# Patient Record
Sex: Female | Born: 1975
Health system: Southern US, Community
[De-identification: ages and names within clinical notes are randomized; demographics above are authoritative.]

## PROBLEM LIST (undated history)

## (undated) DIAGNOSIS — G4733 Obstructive sleep apnea (adult) (pediatric): Secondary | ICD-10-CM

## (undated) DIAGNOSIS — I471 Supraventricular tachycardia: Secondary | ICD-10-CM

## (undated) DIAGNOSIS — F32A Depression, unspecified: Secondary | ICD-10-CM

## (undated) DIAGNOSIS — D649 Anemia, unspecified: Secondary | ICD-10-CM

## (undated) DIAGNOSIS — D259 Leiomyoma of uterus, unspecified: Secondary | ICD-10-CM

## (undated) DIAGNOSIS — R002 Palpitations: Secondary | ICD-10-CM

## (undated) DIAGNOSIS — Z9989 Dependence on other enabling machines and devices: Secondary | ICD-10-CM

## (undated) DIAGNOSIS — K760 Fatty (change of) liver, not elsewhere classified: Secondary | ICD-10-CM

## (undated) DIAGNOSIS — Z91018 Allergy to other foods: Secondary | ICD-10-CM

## (undated) DIAGNOSIS — E041 Nontoxic single thyroid nodule: Secondary | ICD-10-CM

## (undated) DIAGNOSIS — F419 Anxiety disorder, unspecified: Secondary | ICD-10-CM

## (undated) DIAGNOSIS — K7581 Nonalcoholic steatohepatitis (NASH): Secondary | ICD-10-CM

## (undated) DIAGNOSIS — K219 Gastro-esophageal reflux disease without esophagitis: Secondary | ICD-10-CM

## (undated) DIAGNOSIS — I493 Ventricular premature depolarization: Secondary | ICD-10-CM

## (undated) DIAGNOSIS — M2550 Pain in unspecified joint: Secondary | ICD-10-CM

## (undated) DIAGNOSIS — I10 Essential (primary) hypertension: Secondary | ICD-10-CM

## (undated) DIAGNOSIS — E611 Iron deficiency: Secondary | ICD-10-CM

## (undated) HISTORY — DX: Obstructive sleep apnea (adult) (pediatric): G47.33

## (undated) HISTORY — DX: Ventricular premature depolarization: I49.3

## (undated) HISTORY — DX: Pain in unspecified joint: M25.50

## (undated) HISTORY — DX: Palpitations: R00.2

## (undated) HISTORY — DX: Anemia, unspecified: D64.9

## (undated) HISTORY — PX: HIP SURGERY: SHX245

## (undated) HISTORY — DX: Essential (primary) hypertension: I10

## (undated) HISTORY — DX: Nontoxic single thyroid nodule: E04.1

## (undated) HISTORY — DX: Anxiety disorder, unspecified: F41.9

## (undated) HISTORY — DX: Nonalcoholic steatohepatitis (NASH): K75.81

## (undated) HISTORY — DX: Depression, unspecified: F32.A

## (undated) HISTORY — DX: Morbid (severe) obesity due to excess calories: E66.01

## (undated) HISTORY — DX: Supraventricular tachycardia: I47.1

## (undated) HISTORY — DX: Iron deficiency: E61.1

## (undated) HISTORY — DX: Dependence on other enabling machines and devices: Z99.89

## (undated) HISTORY — DX: Allergy to other foods: Z91.018

## (undated) HISTORY — DX: Fatty (change of) liver, not elsewhere classified: K76.0

## (undated) HISTORY — DX: Gastro-esophageal reflux disease without esophagitis: K21.9

## (undated) HISTORY — DX: Leiomyoma of uterus, unspecified: D25.9

## (undated) HISTORY — PX: MANDIBLE RECONSTRUCTION: SHX431

---
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
## 1999-10-23 ENCOUNTER — Emergency Department (HOSPITAL_COMMUNITY): Admission: EM | Admit: 1999-10-23 | Discharge: 1999-10-23 | Payer: Self-pay | Admitting: Emergency Medicine

## 2000-06-24 ENCOUNTER — Emergency Department (HOSPITAL_COMMUNITY): Admission: EM | Admit: 2000-06-24 | Discharge: 2000-06-24 | Payer: Self-pay | Admitting: Emergency Medicine

## 2008-03-11 ENCOUNTER — Emergency Department (HOSPITAL_COMMUNITY): Admission: EM | Admit: 2008-03-11 | Discharge: 2008-03-11 | Payer: Self-pay | Admitting: Emergency Medicine

## 2008-03-18 ENCOUNTER — Emergency Department (HOSPITAL_COMMUNITY): Admission: EM | Admit: 2008-03-18 | Discharge: 2008-03-18 | Payer: Self-pay | Admitting: Family Medicine

## 2008-08-26 ENCOUNTER — Inpatient Hospital Stay (HOSPITAL_COMMUNITY): Admission: AD | Admit: 2008-08-26 | Discharge: 2008-08-26 | Payer: Self-pay | Admitting: Obstetrics & Gynecology

## 2009-01-02 ENCOUNTER — Encounter: Payer: Self-pay | Admitting: Advanced Practice Midwife

## 2009-01-02 ENCOUNTER — Ambulatory Visit: Payer: Self-pay | Admitting: Obstetrics & Gynecology

## 2009-04-01 DIAGNOSIS — I471 Supraventricular tachycardia, unspecified: Secondary | ICD-10-CM

## 2009-04-01 HISTORY — DX: Supraventricular tachycardia, unspecified: I47.10

## 2009-04-01 HISTORY — DX: Supraventricular tachycardia: I47.1

## 2009-07-30 ENCOUNTER — Emergency Department (HOSPITAL_BASED_OUTPATIENT_CLINIC_OR_DEPARTMENT_OTHER): Admission: EM | Admit: 2009-07-30 | Discharge: 2009-07-30 | Payer: Self-pay | Admitting: Emergency Medicine

## 2009-08-10 ENCOUNTER — Encounter: Payer: Self-pay | Admitting: Internal Medicine

## 2009-08-13 ENCOUNTER — Ambulatory Visit: Payer: Self-pay | Admitting: Obstetrics & Gynecology

## 2009-08-14 ENCOUNTER — Encounter: Payer: Self-pay | Admitting: Obstetrics and Gynecology

## 2009-08-14 LAB — CONVERTED CEMR LAB
Chlamydia, DNA Probe: NEGATIVE
Hepatitis B Surface Ag: NEGATIVE

## 2010-04-27 NOTE — Letter (Signed)
Summary: EGD ORDER  EGD ORDER   Imported By: Sofie Rower 08/10/2009 15:39:45  _____________________________________________________________________  External Attachment:    Type:   Image     Comment:   External Document

## 2010-05-14 ENCOUNTER — Other Ambulatory Visit: Payer: Self-pay | Admitting: Obstetrics and Gynecology

## 2010-05-14 ENCOUNTER — Inpatient Hospital Stay (HOSPITAL_COMMUNITY)
Admission: AD | Admit: 2010-05-14 | Discharge: 2010-05-14 | Disposition: A | Discharge status: Home / Self Care | Payer: Medicaid Other | Source: Ambulatory Visit | Attending: Obstetrics and Gynecology | Admitting: Obstetrics and Gynecology

## 2010-05-14 ENCOUNTER — Inpatient Hospital Stay (HOSPITAL_COMMUNITY): Payer: Medicaid Other

## 2010-05-14 DIAGNOSIS — O039 Complete or unspecified spontaneous abortion without complication: Secondary | ICD-10-CM | POA: Insufficient documentation

## 2010-05-14 LAB — CBC
HCT: 30 % — ABNORMAL LOW (ref 36.0–46.0)
MCHC: 30.7 g/dL (ref 30.0–36.0)
MCV: 64.5 fL — ABNORMAL LOW (ref 78.0–100.0)
Platelets: 385 10*3/uL (ref 150–400)
RDW: 19.3 % — ABNORMAL HIGH (ref 11.5–15.5)
WBC: 13.5 10*3/uL — ABNORMAL HIGH (ref 4.0–10.5)

## 2010-05-18 ENCOUNTER — Inpatient Hospital Stay (HOSPITAL_COMMUNITY)
Admission: AD | Admit: 2010-05-18 | Discharge: 2010-05-18 | Disposition: A | Discharge status: Home / Self Care | Payer: Medicaid Other | Source: Ambulatory Visit | Attending: Obstetrics & Gynecology | Admitting: Obstetrics & Gynecology

## 2010-05-18 DIAGNOSIS — R109 Unspecified abdominal pain: Secondary | ICD-10-CM | POA: Insufficient documentation

## 2010-05-18 LAB — CBC
HCT: 30.4 % — ABNORMAL LOW (ref 36.0–46.0)
MCH: 19.9 pg — ABNORMAL LOW (ref 26.0–34.0)
MCHC: 30.3 g/dL (ref 30.0–36.0)
MCV: 65.7 fL — ABNORMAL LOW (ref 78.0–100.0)
Platelets: 407 10*3/uL — ABNORMAL HIGH (ref 150–400)
RDW: 19.7 % — ABNORMAL HIGH (ref 11.5–15.5)
WBC: 12.7 10*3/uL — ABNORMAL HIGH (ref 4.0–10.5)

## 2010-06-04 ENCOUNTER — Encounter (INDEPENDENT_AMBULATORY_CARE_PROVIDER_SITE_OTHER): Payer: Medicaid Other | Admitting: Family Medicine

## 2010-06-04 DIAGNOSIS — R1084 Generalized abdominal pain: Secondary | ICD-10-CM

## 2010-06-04 DIAGNOSIS — O039 Complete or unspecified spontaneous abortion without complication: Secondary | ICD-10-CM

## 2010-06-15 LAB — CBC
HCT: 33.5 % — ABNORMAL LOW (ref 36.0–46.0)
MCHC: 32.5 g/dL (ref 30.0–36.0)
MCV: 69.3 fL — ABNORMAL LOW (ref 78.0–100.0)
Platelets: 413 10*3/uL — ABNORMAL HIGH (ref 150–400)
WBC: 11.6 10*3/uL — ABNORMAL HIGH (ref 4.0–10.5)

## 2010-06-15 LAB — DIFFERENTIAL
Basophils Absolute: 0 10*3/uL (ref 0.0–0.1)
Eosinophils Relative: 2 % (ref 0–5)
Lymphocytes Relative: 16 % (ref 12–46)
Lymphs Abs: 1.9 10*3/uL (ref 0.7–4.0)
Monocytes Relative: 4 % (ref 3–12)
Neutro Abs: 9 10*3/uL — ABNORMAL HIGH (ref 1.7–7.7)

## 2010-06-15 LAB — COMPREHENSIVE METABOLIC PANEL
AST: 14 U/L (ref 0–37)
Albumin: 3.9 g/dL (ref 3.5–5.2)
BUN: 12 mg/dL (ref 6–23)
Calcium: 8.9 mg/dL (ref 8.4–10.5)
Chloride: 106 mEq/L (ref 96–112)
Creatinine, Ser: 0.6 mg/dL (ref 0.4–1.2)
GFR calc Af Amer: >60 mL/min (ref >60)
GFR calc non Af Amer: >60 mL/min (ref >60)
Total Bilirubin: 0.6 mg/dL (ref 0.3–1.2)

## 2010-06-15 LAB — WET PREP, GENITAL: Trich, Wet Prep: NONE SEEN

## 2010-06-15 LAB — URINALYSIS, ROUTINE W REFLEX MICROSCOPIC
Bilirubin Urine: NEGATIVE
Glucose, UA: NEGATIVE mg/dL
Hgb urine dipstick: NEGATIVE
Ketones, ur: NEGATIVE mg/dL
Protein, ur: NEGATIVE mg/dL
Urobilinogen, UA: 0.2 mg/dL (ref 0.0–1.0)

## 2010-06-18 NOTE — Progress Notes (Signed)
Jacqueline Griffin, Jacqueline Griffin            ACCOUNT NO.:  [id number]  MEDICAL RECORD NO.:  [id number]           PATIENT TYPE:  A  LOCATION:  [facility]                   FACILITY:  [facility]  PHYSICIAN:  Darron Doom, MD        DATE OF BIRTH:  06-21-1975  DATE OF SERVICE:  06/04/2010                                 CLINIC NOTE  CHIEF COMPLAINT:  Followup miscarriage.  HISTORY OF PRESENT ILLNESS:  The patient is a 35 year old gravida 2, para 0-0-2-0, who came in to the MAU for an SAB on May 14, 2010, where she miscarried in the MAU.  Since that time, her bleeding has become much less.  She is interested in achieving pregnancy again.  She has a long history of menorrhagia with a known fibroid uterus and possible adenomyosis.  She is uninterested in any kind of hormonal manipulation at this point or definitive therapy.  We had a lengthy discussion going over adenomyosis, endometriosis, and fibroid uterus and what that entails as well.  Additionally today, the patient is complaining right upper quadrant pain that seems to come after certain meals.  This is under her right breast, radiates through to her back, and associated with nausea.  The patient is worried she has gallbladder disease.  This had been going on prior to her miscarriage.  On exam today, her vitals are as noted in the chart.  She is well developed, well nourished in no acute distress.  Abdomen is soft, nontender, and nondistended.  IMPRESSION: 1. Status post spontaneous abortion completed, discussed reasons for     first trimester miscarriage, waiting 2-3 months before trying to     attempt pregnancy again. 2. Adenomyosis and fibroid uterus.  Lengthy discussion was had with     the patient explaining this. 3. Right upper quadrant pain of unclear etiology.  PLAN: 1. PCP referral, workup of right upper quadrant pain. 2. May start to try to achieve pregnancy in the next 2-3 months.           ______________________________ Darron Doom, MD    TP/MEDQ  D:  06/04/2010  T:  06/05/2010  Job:  [id number]

## 2010-07-05 LAB — CBC
HCT: 35.5 % — ABNORMAL LOW (ref 36.0–46.0)
MCV: 73 fL — ABNORMAL LOW (ref 78.0–100.0)
Platelets: 372 10*3/uL (ref 150–400)
RDW: 18 % — ABNORMAL HIGH (ref 11.5–15.5)

## 2010-08-25 ENCOUNTER — Inpatient Hospital Stay (HOSPITAL_COMMUNITY)
Admission: AD | Admit: 2010-08-25 | Discharge: 2010-08-25 | Disposition: A | Discharge status: Home / Self Care | Payer: Self-pay | Source: Ambulatory Visit | Attending: Obstetrics & Gynecology | Admitting: Obstetrics & Gynecology

## 2010-08-25 DIAGNOSIS — N938 Other specified abnormal uterine and vaginal bleeding: Secondary | ICD-10-CM

## 2010-08-25 DIAGNOSIS — N949 Unspecified condition associated with female genital organs and menstrual cycle: Secondary | ICD-10-CM

## 2010-08-25 LAB — CBC
HCT: 33.9 % — ABNORMAL LOW (ref 36.0–46.0)
Hemoglobin: 10.6 g/dL — ABNORMAL LOW (ref 12.0–15.0)
MCH: 21.6 pg — ABNORMAL LOW (ref 26.0–34.0)
MCHC: 31.3 g/dL (ref 30.0–36.0)
MCV: 69 fL — ABNORMAL LOW (ref 78.0–100.0)

## 2010-08-25 LAB — WET PREP, GENITAL: Trich, Wet Prep: NONE SEEN

## 2010-08-25 LAB — URINALYSIS, ROUTINE W REFLEX MICROSCOPIC
Bilirubin Urine: NEGATIVE
Ketones, ur: NEGATIVE mg/dL
Leukocytes, UA: NEGATIVE
Nitrite: NEGATIVE
Protein, ur: NEGATIVE mg/dL
Urobilinogen, UA: 0.2 mg/dL (ref 0.0–1.0)

## 2010-08-25 LAB — URINE MICROSCOPIC-ADD ON

## 2010-08-25 LAB — POCT PREGNANCY, URINE: Preg Test, Ur: NEGATIVE

## 2010-08-26 LAB — GC/CHLAMYDIA PROBE AMP, GENITAL
Chlamydia, DNA Probe: NEGATIVE
GC Probe Amp, Genital: NEGATIVE

## 2010-08-31 ENCOUNTER — Other Ambulatory Visit (HOSPITAL_COMMUNITY): Payer: Self-pay

## 2010-08-31 ENCOUNTER — Ambulatory Visit (HOSPITAL_COMMUNITY)
Admission: RE | Admit: 2010-08-31 | Discharge: 2010-08-31 | Disposition: A | Discharge status: Home / Self Care | Payer: Self-pay | Source: Ambulatory Visit | Attending: Obstetrics & Gynecology | Admitting: Obstetrics & Gynecology

## 2010-08-31 ENCOUNTER — Ambulatory Visit (HOSPITAL_COMMUNITY): Payer: Self-pay

## 2010-08-31 DIAGNOSIS — N949 Unspecified condition associated with female genital organs and menstrual cycle: Secondary | ICD-10-CM | POA: Insufficient documentation

## 2010-08-31 DIAGNOSIS — D259 Leiomyoma of uterus, unspecified: Secondary | ICD-10-CM | POA: Insufficient documentation

## 2010-08-31 DIAGNOSIS — N938 Other specified abnormal uterine and vaginal bleeding: Secondary | ICD-10-CM | POA: Insufficient documentation

## 2010-09-25 DIAGNOSIS — N920 Excessive and frequent menstruation with regular cycle: Secondary | ICD-10-CM | POA: Insufficient documentation

## 2010-09-25 DIAGNOSIS — O039 Complete or unspecified spontaneous abortion without complication: Secondary | ICD-10-CM | POA: Insufficient documentation

## 2010-10-11 ENCOUNTER — Other Ambulatory Visit: Payer: Self-pay | Admitting: Obstetrics & Gynecology

## 2010-11-17 ENCOUNTER — Ambulatory Visit (INDEPENDENT_AMBULATORY_CARE_PROVIDER_SITE_OTHER): Payer: Self-pay | Admitting: Obstetrics and Gynecology

## 2010-11-17 ENCOUNTER — Other Ambulatory Visit (HOSPITAL_COMMUNITY)
Admission: RE | Admit: 2010-11-17 | Discharge: 2010-11-17 | Disposition: A | Discharge status: Home / Self Care | Payer: Self-pay | Source: Ambulatory Visit | Attending: Obstetrics and Gynecology | Admitting: Obstetrics and Gynecology

## 2010-11-17 ENCOUNTER — Encounter: Payer: Self-pay | Admitting: Obstetrics and Gynecology

## 2010-11-17 VITALS — BP 140/81 | HR 83 | Temp 97.2°F | Ht 67.0 in | Wt 264.9 lb

## 2010-11-17 DIAGNOSIS — Z01419 Encounter for gynecological examination (general) (routine) without abnormal findings: Secondary | ICD-10-CM

## 2010-11-17 DIAGNOSIS — N920 Excessive and frequent menstruation with regular cycle: Secondary | ICD-10-CM

## 2010-11-17 DIAGNOSIS — N938 Other specified abnormal uterine and vaginal bleeding: Secondary | ICD-10-CM | POA: Insufficient documentation

## 2010-11-17 DIAGNOSIS — N949 Unspecified condition associated with female genital organs and menstrual cycle: Secondary | ICD-10-CM | POA: Insufficient documentation

## 2010-11-17 DIAGNOSIS — N921 Excessive and frequent menstruation with irregular cycle: Secondary | ICD-10-CM

## 2010-11-17 NOTE — Progress Notes (Signed)
  Subjective:    Patient ID: Jacqueline Griffin, female    DOB: 1975-05-16, 35 y.o.   MRN: [id number]  HPI 35 yo G2P0020 presenting today for annual exam and MAU follow-up. Patient was seen in May for an episode of metrorrhagia for which she was treated with megace. Patient has a know history of fibroid uterus and most recent ultrasound obtained in June demonstrated normal adnexa and stable fibroid uterus with 2 myometrial fibroids measuring 3 cm and 1.7 cm. Patient reports that she had no period in June s/p Megace; had a normal period in July and August. Patient reports however that her periods seem different, in the sense that the blood is much darker and associated with clots on occasions. Patient is not actively trying to conceive at this time and is open to the use of birth control if necessay  PMHx: SVT  PSHx: Jaw surgery  PObHx: Sab x2  PGynHx: Denies cyst, or abnormal pap smear. + fibroid uterus  Social Hx: Denies drinking, smoking or the use of illicit drugs  Family hx: DM, HTN, leukemia  Review of Systems  All other systems reviewed and are negative.      Objective:   Physical Exam GENERAL: Well-developed, well-nourished female in no acute distress.  HEENT: Normocephalic, atraumatic. Sclerae anicteric.  NECK: Supple. Normal thyroid.  LUNGS: Clear to auscultation bilaterally.  HEART: Regular rate and rhythm. BREASTS: Symmetric in size. No palpable masses or lymphadenopathy, skin changes, or nipple drainage. ABDOMEN: Soft, nontender, nondistended. No organomegaly. obese PELVIC: Normal external female genitalia. Vagina is pink and rugated.  Normal discharge. Normal appearing cervix. Uterus is normal in size.  No adnexal mass or tenderness. EXTREMITIES: No cyanosis, clubbing, or edema, 2+ distal pulses.      Assessment & Plan:  35 yo G2P0020 here for annual exam - pap smear collected - ENDOMETRIAL BIOPSY     The indications for endometrial biopsy were reviewed.   Risks  of the biopsy including cramping, bleeding, infection, uterine perforation, inadequate specimen and need for additional procedures  were discussed. The patient states she understands and agrees to undergo procedure today. Consent was signed. Time out was performed. Urine HCG was negative. A sterile speculum was placed in the patient's vagina and the cervix was prepped with Betadine. A single-toothed tenaculum was placed on the anterior lip of the cervix to stabilize it. The uterine cavity was sounded to a depth of 8 cm using the uterine sound. The 3 mm pipelle was introduced into the endometrial cavity without difficulty, 2 passes were made.  A  moderate amount of tissue was  sent to pathology. The instruments were removed from the patient's vagina. Minimal bleeding from the cervix was noted. The patient tolerated the procedure well.  Routine post-procedure instructions were given to the patient.  - Patient will follow up in two weeks to review the results and for further management.  - Patient is undecided on birth control method for now; she is hesitating between [facility] and IUD. Patient will hopefully decide at her next visit.

## 2010-12-15 ENCOUNTER — Ambulatory Visit (INDEPENDENT_AMBULATORY_CARE_PROVIDER_SITE_OTHER): Payer: Self-pay | Admitting: Physician Assistant

## 2010-12-15 DIAGNOSIS — Z7189 Other specified counseling: Secondary | ICD-10-CM

## 2010-12-15 DIAGNOSIS — Z712 Person consulting for explanation of examination or test findings: Secondary | ICD-10-CM

## 2010-12-15 DIAGNOSIS — N920 Excessive and frequent menstruation with regular cycle: Secondary | ICD-10-CM

## 2010-12-15 MED ORDER — NORGESTIMATE-ETH ESTRADIOL 0.25-35 MG-MCG PO TABS: 1.0000 | ORAL_TABLET | Freq: Every day | ORAL | Status: DC

## 2010-12-15 NOTE — Patient Instructions (Addendum)
It was great to see you today! I am giving you a prescription for birth control pills to try to regulate your cycles. Come back to see [place] for your pap smear is 1 year or sooner if you have problems.

## 2010-12-15 NOTE — Progress Notes (Signed)
Subjective: Pt presents today for followup and reviewing results.  Has not been having any problems since her procedure.  Reviewed normal results with pt.  Also discussed OCPs for regulating of her cycles (both pain/amount of bleeding and regularity).  Pt is interested in this at this time.  She has still had somewhat irregular periods for about the last 6 months.  Objective:  Filed Vitals:   12/15/10 1559  BP: 134/82  Pulse:   Temp:    Gen: NAD, obese CV: RRR Resp: CTABL Ext: No edema  Assessment/Plan: Decided to start OCPs today.  Answered all patient questions on starting them, possible side effect, and risk/benefits.  Greater than 80% of 15 minute visit spent counciling on this issue.  Return for pap smear in 1 year.

## 2012-04-01 HISTORY — DX: Morbid (severe) obesity due to excess calories: E66.01

## 2012-11-28 DIAGNOSIS — K7581 Nonalcoholic steatohepatitis (NASH): Secondary | ICD-10-CM

## 2012-11-28 HISTORY — DX: Nonalcoholic steatohepatitis (NASH): K75.81

## 2014-01-27 ENCOUNTER — Encounter: Payer: Self-pay | Admitting: Obstetrics and Gynecology

## 2014-11-07 DIAGNOSIS — E041 Nontoxic single thyroid nodule: Secondary | ICD-10-CM | POA: Insufficient documentation

## 2014-11-07 HISTORY — DX: Nontoxic single thyroid nodule: E04.1

## 2015-08-14 ENCOUNTER — Emergency Department (HOSPITAL_BASED_OUTPATIENT_CLINIC_OR_DEPARTMENT_OTHER)
Admission: EM | Admit: 2015-08-14 | Discharge: 2015-08-14 | Disposition: A | Discharge status: Home / Self Care | Payer: Managed Care, Other (non HMO) | Attending: Emergency Medicine | Admitting: Emergency Medicine

## 2015-08-14 ENCOUNTER — Emergency Department (HOSPITAL_BASED_OUTPATIENT_CLINIC_OR_DEPARTMENT_OTHER): Payer: Managed Care, Other (non HMO)

## 2015-08-14 ENCOUNTER — Encounter (HOSPITAL_BASED_OUTPATIENT_CLINIC_OR_DEPARTMENT_OTHER): Payer: Self-pay | Admitting: *Deleted

## 2015-08-14 DIAGNOSIS — Z79899 Other long term (current) drug therapy: Secondary | ICD-10-CM | POA: Diagnosis not present

## 2015-08-14 DIAGNOSIS — R1012 Left upper quadrant pain: Secondary | ICD-10-CM | POA: Insufficient documentation

## 2015-08-14 DIAGNOSIS — M545 Low back pain, unspecified: Secondary | ICD-10-CM

## 2015-08-14 LAB — CBC WITH DIFFERENTIAL/PLATELET
Basophils Absolute: 0 10*3/uL (ref 0.0–0.1)
Basophils Relative: 0 %
Eosinophils Absolute: 0.2 10*3/uL (ref 0.0–0.7)
Eosinophils Relative: 1 %
HCT: 36.2 % (ref 36.0–46.0)
Hemoglobin: 11.8 g/dL — ABNORMAL LOW (ref 12.0–15.0)
Lymphocytes Relative: 20 %
Lymphs Abs: 2.4 10*3/uL (ref 0.7–4.0)
MCH: 23.6 pg — ABNORMAL LOW (ref 26.0–34.0)
MCHC: 32.6 g/dL (ref 30.0–36.0)
MCV: 72.4 fL — ABNORMAL LOW (ref 78.0–100.0)
Monocytes Absolute: 0.8 10*3/uL (ref 0.1–1.0)
Monocytes Relative: 7 %
Neutro Abs: 8.5 10*3/uL — ABNORMAL HIGH (ref 1.7–7.7)
Neutrophils Relative %: 72 %
Platelets: 373 10*3/uL (ref 150–400)
RBC: 5 MIL/uL (ref 3.87–5.11)
RDW: 16.9 % — ABNORMAL HIGH (ref 11.5–15.5)
WBC: 11.9 10*3/uL — ABNORMAL HIGH (ref 4.0–10.5)

## 2015-08-14 LAB — URINE MICROSCOPIC-ADD ON

## 2015-08-14 LAB — COMPREHENSIVE METABOLIC PANEL
ALT: 10 U/L — ABNORMAL LOW (ref 14–54)
AST: 15 U/L (ref 15–41)
Albumin: 3.7 g/dL (ref 3.5–5.0)
Alkaline Phosphatase: 70 U/L (ref 38–126)
Anion gap: 7 (ref 5–15)
BUN: 11 mg/dL (ref 6–20)
CO2: 23 mmol/L (ref 22–32)
Calcium: 8.8 mg/dL — ABNORMAL LOW (ref 8.9–10.3)
Chloride: 106 mmol/L (ref 101–111)
Creatinine, Ser: 0.62 mg/dL (ref 0.44–1.00)
GFR calc Af Amer: >60 mL/min (ref >60)
GFR calc non Af Amer: >60 mL/min (ref >60)
Glucose, Bld: 96 mg/dL (ref 65–99)
Potassium: 3.4 mmol/L — ABNORMAL LOW (ref 3.5–5.1)
Sodium: 136 mmol/L (ref 135–145)
Total Bilirubin: 0.6 mg/dL (ref 0.3–1.2)
Total Protein: 8 g/dL (ref 6.5–8.1)

## 2015-08-14 LAB — URINALYSIS, ROUTINE W REFLEX MICROSCOPIC
BILIRUBIN URINE: NEGATIVE
Glucose, UA: NEGATIVE mg/dL
KETONES UR: NEGATIVE mg/dL
NITRITE: NEGATIVE
Protein, ur: NEGATIVE mg/dL
SPECIFIC GRAVITY, URINE: 1.022 (ref 1.005–1.030)
pH: 6 (ref 5.0–8.0)

## 2015-08-14 LAB — LIPASE, BLOOD: LIPASE: 20 U/L (ref 11–51)

## 2015-08-14 LAB — PREGNANCY, URINE: PREG TEST UR: NEGATIVE

## 2015-08-14 MED ORDER — ONDANSETRON HCL 4 MG/2ML IJ SOLN: 4.0000 mg | Freq: Once | INTRAMUSCULAR | Status: DC

## 2015-08-14 MED ORDER — CYCLOBENZAPRINE HCL 10 MG PO TABS: 10.0000 mg | ORAL_TABLET | Freq: Two times a day (BID) | ORAL | Status: DC | PRN

## 2015-08-14 MED ORDER — NAPROXEN 500 MG PO TABS: 500.0000 mg | ORAL_TABLET | Freq: Two times a day (BID) | ORAL | Status: DC

## 2015-08-14 MED ORDER — SODIUM CHLORIDE 0.9 % IV BOLUS (SEPSIS): 500.0000 mL | Freq: Once | INTRAVENOUS | Status: DC

## 2015-08-14 MED ORDER — SODIUM CHLORIDE 0.9 % IV SOLN: INTRAVENOUS | Status: DC

## 2015-08-14 MED ORDER — HYDROCODONE-ACETAMINOPHEN 5-325 MG PO TABS: 1.0000 | ORAL_TABLET | Freq: Four times a day (QID) | ORAL | Status: DC | PRN

## 2015-08-14 MED ORDER — IOPAMIDOL (ISOVUE-300) INJECTION 61%
100.0000 mL | Freq: Once | INTRAVENOUS | Status: AC | PRN
  Administered 2015-08-14: 100 mL via INTRAVENOUS

## 2015-08-14 NOTE — ED Notes (Addendum)
Pain in her left upper abdominal quadrant that feels like gas. She is being treated for BV.

## 2015-08-14 NOTE — ED Provider Notes (Signed)
CSN: [id number]     Arrival date & time 08/14/15  1457 History   First MD Initiated Contact with Patient 08/14/15 1606     Chief Complaint  Patient presents with  . Abdominal Pain     (Consider location/radiation/quality/duration/timing/severity/associated sxs/prior Treatment) Patient is a 40 y.o. female presenting with abdominal pain. The history is provided by the patient.  Abdominal Pain Associated symptoms: constipation   Associated symptoms: no chest pain, no dysuria, no fever, no nausea, no shortness of breath and no vomiting   Patient with complaint of left lower back pain for 2-3 weeks. Made worse with certain movements. Today started with left upper quadrant abdominal pain. No nausea no vomiting. Patient has had some constipation of late. No dysuria. No fevers. No history of any kidney stones. No blood in the urine. Patient recently seen by her OB/GYN treated for bacterial vaginosis with Flagyl. And also yeast infection with Diflucan. Patient has no lower abdominal or pelvic complaints today. Left upper quadrant pain today has been constant but waxes and wanes. No history of similar pain.  Past Medical History  Diagnosis Date  . Supraventricular tachycardia ([place]) 1997  . Uterine fibroid    Past Surgical History  Procedure Laterality Date  . Hip surgery    . Mandible reconstruction  40 yo  . Cesarean section     Family History  Problem Relation Age of Onset  . Hypertension Father   . Leukemia Mother    Social History  Substance Use Topics  . Smoking status: Never Smoker   . Smokeless tobacco: Never Used  . Alcohol Use: No   OB History    Gravida Para Term Preterm AB TAB SAB Ectopic Multiple Living   2    1  1    0     Review of Systems  Constitutional: Negative for fever.  HENT: Negative for congestion.   Eyes: Negative for visual disturbance.  Respiratory: Negative for shortness of breath.   Cardiovascular: Negative for chest pain.  Gastrointestinal: Positive  for abdominal pain and constipation. Negative for nausea and vomiting.  Genitourinary: Negative for dysuria.  Musculoskeletal: Positive for back pain. Negative for neck pain.  Skin: Negative for rash.  Neurological: Negative for speech difficulty and headaches.  Hematological: Does not bruise/bleed easily.  Psychiatric/Behavioral: Negative for confusion.      Allergies  Amoxicillin and Clindamycin/lincomycin  Home Medications   Prior to Admission medications   Medication Sig Start Date End Date Taking? Authorizing Provider  Fluconazole (DIFLUCAN PO) Take by mouth.   Yes Historical Provider, MD  MetroNIDAZOLE (FLAGYL PO) Take by mouth.   Yes Historical Provider, MD  metoprolol (LOPRESSOR) 50 MG tablet Take 50 mg by mouth 2 (two) times daily.      Historical Provider, MD  norgestimate-ethinyl estradiol ([place] 28) 0.25-35 MG-MCG tablet Take 1 tablet by mouth daily. 12/15/10 12/15/11  Vivi Ferns, MD  Prenatal Vit-Fe Psac Cmplx-FA (PRENATAL MULTIVITAMIN) 60-1 MG tablet Take 1 tablet by mouth daily with breakfast.      Historical Provider, MD   BP 119/68 mmHg  Pulse 71  Temp(Src) 98.7 F (37.1 C) (Oral)  Resp 18  Ht 5' 6"  (1.676 m)  Wt 114.76 kg  BMI 40.85 kg/m2  SpO2 99%  LMP 08/14/2015 Physical Exam  Constitutional: She is oriented to person, place, and time. She appears well-developed and well-nourished. No distress.  HENT:  Head: Normocephalic and atraumatic.  Mouth/Throat: Oropharynx is clear and moist.  Eyes: Conjunctivae and EOM are  normal. Pupils are equal, round, and reactive to light.  Neck: Normal range of motion. Neck supple.  Cardiovascular: Normal rate, regular rhythm and intact distal pulses.   Pulmonary/Chest: Effort normal and breath sounds normal. No respiratory distress.  Abdominal: Soft. Bowel sounds are normal.  Slight tenderness without guarding to palpation left upper quadrant.  Musculoskeletal: Normal range of motion.  Mild tenderness to palpation of  the left of lumbar part of the back. No midline tenderness.  Neurological: She is alert and oriented to person, place, and time. No cranial nerve deficit. She exhibits normal muscle tone. Coordination normal.  Skin: Skin is warm. No rash noted.  Nursing note and vitals reviewed.   ED Course  Procedures (including critical care time) Labs Review Labs Reviewed  URINALYSIS, ROUTINE W REFLEX MICROSCOPIC (NOT AT [facility]) - Abnormal; Notable for the following:    Hgb urine dipstick LARGE (*)    Leukocytes, UA TRACE (*)    All other components within normal limits  URINE MICROSCOPIC-ADD ON - Abnormal; Notable for the following:    Squamous Epithelial / LPF 0-5 (*)    Bacteria, UA FEW (*)    All other components within normal limits  COMPREHENSIVE METABOLIC PANEL - Abnormal; Notable for the following:    Potassium 3.4 (*)    Calcium 8.8 (*)    ALT 10 (*)    All other components within normal limits  CBC WITH DIFFERENTIAL/PLATELET - Abnormal; Notable for the following:    WBC 11.9 (*)    Hemoglobin 11.8 (*)    MCV 72.4 (*)    MCH 23.6 (*)    RDW 16.9 (*)    Neutro Abs 8.5 (*)    All other components within normal limits  PREGNANCY, URINE  LIPASE, BLOOD   Results for orders placed or performed during the hospital encounter of 08/14/15  Urinalysis, Routine w reflex microscopic  Result Value Ref Range   Color, Urine YELLOW YELLOW   APPearance CLEAR CLEAR   Specific Gravity, Urine 1.022 1.005 - 1.030   pH 6.0 5.0 - 8.0   Glucose, UA NEGATIVE NEGATIVE mg/dL   Hgb urine dipstick LARGE (A) NEGATIVE   Bilirubin Urine NEGATIVE NEGATIVE   Ketones, ur NEGATIVE NEGATIVE mg/dL   Protein, ur NEGATIVE NEGATIVE mg/dL   Nitrite NEGATIVE NEGATIVE   Leukocytes, UA TRACE (A) NEGATIVE  Pregnancy, urine  Result Value Ref Range   Preg Test, Ur NEGATIVE NEGATIVE  Urine microscopic-add on  Result Value Ref Range   Squamous Epithelial / LPF 0-5 (A) NONE SEEN   WBC, UA 0-5 0 - 5 WBC/hpf   RBC / HPF  6-30 0 - 5 RBC/hpf   Bacteria, UA FEW (A) NONE SEEN   Urine-Other MUCOUS PRESENT   Comprehensive metabolic panel  Result Value Ref Range   Sodium 136 135 - 145 mmol/L   Potassium 3.4 (L) 3.5 - 5.1 mmol/L   Chloride 106 101 - 111 mmol/L   CO2 23 22 - 32 mmol/L   Glucose, Bld 96 65 - 99 mg/dL   BUN 11 6 - 20 mg/dL   Creatinine, Ser 0.62 0.44 - 1.00 mg/dL   Calcium 8.8 (L) 8.9 - 10.3 mg/dL   Total Protein 8.0 6.5 - 8.1 g/dL   Albumin 3.7 3.5 - 5.0 g/dL   AST 15 15 - 41 U/L   ALT 10 (L) 14 - 54 U/L   Alkaline Phosphatase 70 38 - 126 U/L   Total Bilirubin 0.6 0.3 - 1.2 mg/dL  GFR calc non Af Amer >60 >60 mL/min   GFR calc Af Amer >60 >60 mL/min   Anion gap 7 5 - 15  Lipase, blood  Result Value Ref Range   Lipase 20 11 - 51 U/L  CBC with Differential/Platelet  Result Value Ref Range   WBC 11.9 (H) 4.0 - 10.5 K/uL   RBC 5.00 3.87 - 5.11 MIL/uL   Hemoglobin 11.8 (L) 12.0 - 15.0 g/dL   HCT 36.2 36.0 - 46.0 %   MCV 72.4 (L) 78.0 - 100.0 fL   MCH 23.6 (L) 26.0 - 34.0 pg   MCHC 32.6 30.0 - 36.0 g/dL   RDW 16.9 (H) 11.5 - 15.5 %   Platelets 373 150 - 400 K/uL   Neutrophils Relative % 72 %   Neutro Abs 8.5 (H) 1.7 - 7.7 K/uL   Lymphocytes Relative 20 %   Lymphs Abs 2.4 0.7 - 4.0 K/uL   Monocytes Relative 7 %   Monocytes Absolute 0.8 0.1 - 1.0 K/uL   Eosinophils Relative 1 %   Eosinophils Absolute 0.2 0.0 - 0.7 K/uL   Basophils Relative 0 %   Basophils Absolute 0.0 0.0 - 0.1 K/uL     Imaging Review Dg Chest 2 View  08/14/2015  CLINICAL DATA:  Cough and congestion.  Left-sided chest pain. EXAM: CHEST  2 VIEW COMPARISON:  03/11/2008 FINDINGS: The heart size and mediastinal contours are within normal limits. Both lungs are clear. The visualized skeletal structures are unremarkable. IMPRESSION: No active cardiopulmonary disease. Electronically Signed   By: Markus Daft M.D.   On: 08/14/2015 16:55   Ct Abdomen Pelvis W Contrast  08/14/2015  CLINICAL DATA:  Abdominal pain. Patient  reports constipation, left upper quadrant abdominal pain and left flank pain for 1.5 weeks. EXAM: CT ABDOMEN AND PELVIS WITH CONTRAST TECHNIQUE: Multidetector CT imaging of the abdomen and pelvis was performed using the standard protocol following bolus administration of intravenous contrast. CONTRAST:  116m ISOVUE-300 IOPAMIDOL (ISOVUE-300) INJECTION 61% COMPARISON:  None. FINDINGS: Lower chest: No significant pulmonary nodules or acute consolidative airspace disease. Hepatobiliary: Normal liver with no liver mass. Cholecystectomy . No biliary ductal dilatation. Pancreas: Normal, with no mass or duct dilation. Spleen: Normal size. No mass. Adrenals/Urinary Tract: Normal adrenals. Normal kidneys with no hydronephrosis and no renal mass. Normal bladder. Stomach/Bowel: Grossly normal stomach. Normal caliber small bowel with no small bowel wall thickening. Normal appendix. Normal large bowel with no diverticulosis, large bowel wall thickening or pericolonic fat stranding. Vascular/Lymphatic: Normal caliber abdominal aorta. Patent portal, splenic, hepatic and renal veins. No pathologically enlarged lymph nodes in the abdomen or pelvis. Reproductive: Mildly enlarged, globular and heterogeneous anteverted uterus, consistent with the myomatous uterus seen on 2012 pelvic sonogram. No adnexal mass. Other: No pneumoperitoneum, ascites or focal fluid collection. Musculoskeletal: No aggressive appearing focal osseous lesions. Mild degenerative changes in the visualized thoracolumbar spine. Healed deformity in the lateral left iliac wing. Prominent relatively symmetric sclerosis and subchondral cystic change in the pubic symphysis, most consistent with osteitis pubis. IMPRESSION: 1. No acute abnormality. No evidence of bowel obstruction or acute bowel inflammation. Normal appendix. No hydronephrosis. 2. Mildly enlarged myomatous uterus. Electronically Signed   By: JIlona SorrelM.D.   On: 08/14/2015 18:20   I have personally  reviewed and evaluated these images and lab results as part of my medical decision-making.   EKG Interpretation None      MDM   Final diagnoses:  Left-sided low back pain without sciatica  Left  upper quadrant pain    The patient's left lumbar back pain suggestive of muscle skeletal back pain. Workup for the left upper quadrant abdominal pain without any significant findings. Labs without significant abnormalities. Questionable early urinary tract infection but clinically does not seem to be relevant urine culture sent to be complete. Will treat patient symptomatically with Naprosyn hydrocodone and Flexeril.    Fredia Sorrow, MD 08/14/15 1925

## 2015-08-14 NOTE — Discharge Instructions (Signed)
Workup for the left-sided back pain and left upper quadrant abdominal pain without any acute findings. Labs normal. Urine was sent for urine culture just to rule out the urinary tract infection but clinically no evidence of it. Take the Naprosyn on a regular basis. Supplement with hydrocodone as needed for additional pain. Take the Flexeril as a muscle relaxer particularly at night. Return for any new or worse symptoms. Results of your labs provided.

## 2015-08-16 LAB — URINE CULTURE

## 2015-12-16 ENCOUNTER — Encounter (HOSPITAL_BASED_OUTPATIENT_CLINIC_OR_DEPARTMENT_OTHER): Payer: Self-pay | Admitting: Emergency Medicine

## 2015-12-16 ENCOUNTER — Emergency Department (HOSPITAL_BASED_OUTPATIENT_CLINIC_OR_DEPARTMENT_OTHER)
Admission: EM | Admit: 2015-12-16 | Discharge: 2015-12-16 | Disposition: A | Discharge status: Home / Self Care | Payer: Managed Care, Other (non HMO) | Attending: Emergency Medicine | Admitting: Emergency Medicine

## 2015-12-16 DIAGNOSIS — T7840XA Allergy, unspecified, initial encounter: Secondary | ICD-10-CM | POA: Diagnosis not present

## 2015-12-16 DIAGNOSIS — L299 Pruritus, unspecified: Secondary | ICD-10-CM | POA: Diagnosis present

## 2015-12-16 MED ORDER — METHYLPREDNISOLONE SODIUM SUCC 125 MG IJ SOLR
125.0000 mg | Freq: Once | INTRAMUSCULAR | Status: AC
  Administered 2015-12-16: 125 mg via INTRAMUSCULAR
  Filled 2015-12-16: qty 2

## 2015-12-16 MED ORDER — PREDNISONE 20 MG PO TABS: ORAL_TABLET | ORAL | 0 refills | Status: DC

## 2015-12-16 MED ORDER — LORATADINE 10 MG PO TABS: 10.0000 mg | ORAL_TABLET | Freq: Every day | ORAL | 0 refills | Status: DC

## 2015-12-16 MED ORDER — FAMOTIDINE 20 MG PO TABS
20.0000 mg | ORAL_TABLET | Freq: Once | ORAL | Status: AC
  Administered 2015-12-16: 20 mg via ORAL
  Filled 2015-12-16: qty 1

## 2015-12-16 MED ORDER — DIPHENHYDRAMINE HCL 25 MG PO CAPS
25.0000 mg | ORAL_CAPSULE | Freq: Once | ORAL | Status: AC
  Administered 2015-12-16: 25 mg via ORAL
  Filled 2015-12-16: qty 1

## 2015-12-16 NOTE — ED Provider Notes (Signed)
[place] DEPT [facility] Provider Note   CSN: [id number] Arrival date & time: 12/16/15  [id number]     History   Chief Complaint Chief Complaint  Patient presents with  . Pruritis  . Urticaria    HPI Jacqueline Griffin is a 40 y.o. female.  HPI Patient presents with pruritic rash to her bilateral upper extremities and trunk. States this started roughly 24 hours ago. She's been taking Benadryl with some improvement of her symptoms. She states around 6 AM this morning she developed sensation of throat swelling. This prompted her visit to the emergency department. Denies any difficulty breathing. No voice changes. Patient took 2 doses of Flagyl last week. Has been using a new cleaning powder.  Past Medical History:  Diagnosis Date  . Supraventricular tachycardia ([place]) 1997  . Uterine fibroid     Patient Active Problem List   Diagnosis Date Noted  . Menorrhagia 09/25/2010  . SAB (spontaneous abortion) 09/25/2010    Past Surgical History:  Procedure Laterality Date  . CESAREAN SECTION    . HIP SURGERY    . MANDIBLE RECONSTRUCTION  40 yo    OB History    Gravida Para Term Preterm AB Living   2       1 0   SAB TAB Ectopic Multiple Live Births   1               Home Medications    Prior to Admission medications   Medication Sig Start Date End Date Taking? Authorizing Provider  cyclobenzaprine (FLEXERIL) 10 MG tablet Take 1 tablet (10 mg total) by mouth 2 (two) times daily as needed for muscle spasms. 08/14/15   Fredia Sorrow, MD  Fluconazole (DIFLUCAN PO) Take by mouth.    Historical Provider, MD  HYDROcodone-acetaminophen (NORCO/VICODIN) 5-325 MG tablet Take 1-2 tablets by mouth every 6 (six) hours as needed for moderate pain. 08/14/15   Fredia Sorrow, MD  loratadine (CLARITIN) 10 MG tablet Take 1 tablet (10 mg total) by mouth daily. 12/16/15   Julianne Rice, MD  metoprolol (LOPRESSOR) 50 MG tablet Take 50 mg by mouth 2 (two) times daily.      Historical Provider, MD    MetroNIDAZOLE (FLAGYL PO) Take by mouth.    Historical Provider, MD  naproxen (NAPROSYN) 500 MG tablet Take 1 tablet (500 mg total) by mouth 2 (two) times daily. 08/14/15   Fredia Sorrow, MD  norgestimate-ethinyl estradiol ([place] 28) 0.25-35 MG-MCG tablet Take 1 tablet by mouth daily. 12/15/10 12/15/11  Vivi Ferns, MD  predniSONE (DELTASONE) 20 MG tablet 3 tabs po day one, then 2 po daily x 4 days 12/16/15   Julianne Rice, MD  Prenatal Vit-Fe Psac Cmplx-FA (PRENATAL MULTIVITAMIN) 60-1 MG tablet Take 1 tablet by mouth daily with breakfast.      Historical Provider, MD    Family History Family History  Problem Relation Age of Onset  . Hypertension Father   . Leukemia Mother     Social History Social History  Substance Use Topics  . Smoking status: Never Smoker  . Smokeless tobacco: Never Used  . Alcohol use No     Allergies   Shrimp [shellfish allergy]; Amoxicillin; and Clindamycin/lincomycin   Review of Systems Review of Systems  Constitutional: Negative for chills and fever.  HENT: Negative for facial swelling, trouble swallowing and voice change.   Respiratory: Negative for chest tightness and shortness of breath.   Cardiovascular: Negative for leg swelling.  Gastrointestinal: Negative for nausea and vomiting.  Musculoskeletal:  Negative for neck pain.  Skin: Positive for rash.  Neurological: Negative for syncope, weakness and numbness.  All other systems reviewed and are negative.    Physical Exam Updated Vital Signs BP 143/65 (BP Location: Right Arm)   Pulse 86   Temp 99 F (37.2 C) (Oral)   Resp 18   Ht 5\' 6"  (1.676 m)   Wt 255 lb (115.7 kg)   SpO2 99%   BMI 41.16 kg/m   Physical Exam  Constitutional: She is oriented to person, place, and time. She appears well-developed and well-nourished. No distress.  Patient speaks in clear voice. No distress.  HENT:  Head: Normocephalic and atraumatic.  Mouth/Throat: Oropharynx is clear and moist.  No obvious  oropharyngeal swelling  Eyes: EOM are normal. Pupils are equal, round, and reactive to light.  Neck: Normal range of motion. Neck supple.  Cardiovascular: Normal rate and regular rhythm.   Pulmonary/Chest: Effort normal and breath sounds normal. No stridor. No respiratory distress. She has no wheezes. She has no rales. She exhibits no tenderness.  Abdominal: Soft. Bowel sounds are normal. There is no tenderness. There is no rebound and no guarding.  Musculoskeletal: Normal range of motion. She exhibits no edema or tenderness.  Neurological: She is alert and oriented to person, place, and time.  Skin: Skin is warm and dry. Rash noted. No erythema.  Raised erythematous plaques the bilateral upper extremities and trunk. Consistent with urticaria.  Psychiatric: She has a normal mood and affect. Her behavior is normal.  Nursing note and vitals reviewed.    ED Treatments / Results  Labs (all labs ordered are listed, but only abnormal results are displayed) Labs Reviewed - No data to display  EKG  EKG Interpretation None       Radiology No results found.  Procedures Procedures (including critical care time)  Medications Ordered in ED Medications  methylPREDNISolone sodium succinate (SOLU-MEDROL) 125 mg/2 mL injection 125 mg (125 mg Intramuscular Given 12/16/15 0750)  famotidine (PEPCID) tablet 20 mg (20 mg Oral Given 12/16/15 0750)  diphenhydrAMINE (BENADRYL) capsule 25 mg (25 mg Oral Given 12/16/15 1027)     Initial Impression / Assessment and Plan / ED Course  I have reviewed the triage vital signs and the nursing notes.  Pertinent labs & imaging results that were available during my care of the patient were reviewed by me and considered in my medical decision making (see chart for details).  Clinical Course    Rash is resolved. Throat swelling sensation has resolved. We will start on a course of prednisone. She will follow-up with her son's allergist. Return precautions  given.  Final Clinical Impressions(s) / ED Diagnoses   Final diagnoses:  Allergic reaction, initial encounter    New Prescriptions New Prescriptions   LORATADINE (CLARITIN) 10 MG TABLET    Take 1 tablet (10 mg total) by mouth daily.   PREDNISONE (DELTASONE) 20 MG TABLET    3 tabs po day one, then 2 po daily x 4 days     Julianne Rice, MD 12/16/15 1148

## 2015-12-16 NOTE — ED Notes (Signed)
Pt reports the itching has come back. Per EDP admin the ordered dose of benadryl.

## 2015-12-16 NOTE — ED Notes (Signed)
Patient is resting comfortably. 

## 2015-12-16 NOTE — ED Triage Notes (Signed)
Pt c/o generalized hives with itching since Monday. States she has changed washing powder but isn't sure if this is the cause. Denise SOB and speaking in clear sentences. Ambulatory

## 2015-12-16 NOTE — ED Notes (Signed)
MD at bedside. 

## 2016-04-29 DIAGNOSIS — G4733 Obstructive sleep apnea (adult) (pediatric): Secondary | ICD-10-CM

## 2016-04-29 DIAGNOSIS — Z9989 Dependence on other enabling machines and devices: Secondary | ICD-10-CM

## 2016-04-29 HISTORY — DX: Obstructive sleep apnea (adult) (pediatric): G47.33

## 2016-08-08 ENCOUNTER — Emergency Department
Admission: EM | Admit: 2016-08-08 | Discharge: 2016-08-08 | Disposition: A | Discharge status: Home / Self Care | Payer: Managed Care, Other (non HMO) | Source: Home / Self Care | Attending: Family Medicine | Admitting: Family Medicine

## 2016-08-08 ENCOUNTER — Encounter: Payer: Self-pay | Admitting: Emergency Medicine

## 2016-08-08 DIAGNOSIS — M62838 Other muscle spasm: Secondary | ICD-10-CM

## 2016-08-08 MED ORDER — CYCLOBENZAPRINE HCL 5 MG PO TABS: 5.0000 mg | ORAL_TABLET | Freq: Two times a day (BID) | ORAL | 0 refills | Status: DC | PRN

## 2016-08-08 MED ORDER — IBUPROFEN 600 MG PO TABS: 600.0000 mg | ORAL_TABLET | Freq: Four times a day (QID) | ORAL | 0 refills | Status: DC | PRN

## 2016-08-08 NOTE — ED Triage Notes (Signed)
Neck, shoulder and scapula pain x 1.5 days, woke up with it yesterday morning.

## 2016-08-08 NOTE — Discharge Instructions (Signed)
°  Flexeril is a muscle relaxer and may cause drowsiness. Do not drink alcohol, drive, or operate heavy machinery while taking.

## 2016-08-08 NOTE — ED Provider Notes (Signed)
CSN: [id number]     Arrival date & time 08/08/16  1110 History   First MD Initiated Contact with Patient 08/08/16 1149     Chief Complaint  Patient presents with  . Neck Pain   (Consider location/radiation/quality/duration/timing/severity/associated sxs/prior Treatment) HPI  Jacqueline Griffin is a 41 y.o. female presenting to UC with c/o gradually worsening neck and upper back pain and stiffness for 1.5 days.  Pain is 9/10 at worst. some relief with ibuprofen but not Aleve. Pt notes she woke up with the pain but had been cleaning around the house more than usual the day before. Denies known injury. Pain is aching and sharp at times with certain head movements. No radiation of pain or numbness in arms or legs. No lower back pain. Hx of back pain in the past, she was prescribed flexeril but never wound up taking it. She notes the medication expired last month.   Past Medical History:  Diagnosis Date  . Supraventricular tachycardia ([place]) 1997  . Uterine fibroid    Past Surgical History:  Procedure Laterality Date  . CESAREAN SECTION    . HIP SURGERY    . MANDIBLE RECONSTRUCTION  40 yo   Family History  Problem Relation Age of Onset  . Hypertension Father   . Leukemia Mother    Social History  Substance Use Topics  . Smoking status: Never Smoker  . Smokeless tobacco: Never Used  . Alcohol use No   OB History    Gravida Para Term Preterm AB Living   2       1 0   SAB TAB Ectopic Multiple Live Births   1             Review of Systems  Musculoskeletal: Positive for back pain, myalgias, neck pain and neck stiffness. Negative for arthralgias.  Skin: Negative for color change, rash and wound.  Neurological: Negative for weakness and numbness.    Allergies  Shrimp [shellfish allergy]; Amoxicillin; and Clindamycin/lincomycin  Home Medications   Prior to Admission medications   Medication Sig Start Date End Date Taking? Authorizing Provider  cyclobenzaprine (FLEXERIL) 5 MG  tablet Take 1-2 tablets (5-10 mg total) by mouth 2 (two) times daily as needed for muscle spasms. 08/08/16   Noland Fordyce, PA-C  ibuprofen (ADVIL,MOTRIN) 600 MG tablet Take 1 tablet (600 mg total) by mouth every 6 (six) hours as needed. 08/08/16   Noland Fordyce, PA-C  metoprolol (LOPRESSOR) 50 MG tablet Take 50 mg by mouth 2 (two) times daily.      [provider]  naproxen (NAPROSYN) 500 MG tablet Take 1 tablet (500 mg total) by mouth 2 (two) times daily. 08/14/15   Fredia Sorrow, MD   Meds Ordered and Administered this Visit  Medications - No data to display  BP 133/82 (BP Location: Left Arm)   Pulse 79   Temp 98.8 F (37.1 C) (Oral)   Ht 5\' 6"  (1.676 m)   Wt 261 lb (118.4 kg)   LMP 07/23/2016   SpO2 99%   BMI 42.13 kg/m  No data found.   Physical Exam  Constitutional: She is oriented to person, place, and time. She appears well-developed and well-nourished. No distress.  HENT:  Head: Normocephalic and atraumatic.  Mouth/Throat: Oropharynx is clear and moist.  Eyes: EOM are normal.  Neck: Neck supple. Muscular tenderness present. No spinous process tenderness present. Decreased range of motion present.    Tenderness to bilateral cervical muscles. No midline spinal tenderness. Limited head rotation  and neck extension due to pain and stiffness.  Cardiovascular: Normal rate.   Pulmonary/Chest: Effort normal. No respiratory distress.  Musculoskeletal: She exhibits tenderness.  No midline spinal tenderness. Tenderness to upper trapezius on Left and Right side.  Neurological: She is alert and oriented to person, place, and time.  Skin: Skin is warm and dry. She is not diaphoretic.  Psychiatric: She has a normal mood and affect. Her behavior is normal.  Nursing note and vitals reviewed.   Urgent Care Course     Procedures (including critical care time)  Labs Review Labs Reviewed - No data to display  Imaging Review No results found.   MDM   1. Muscle  spasms of neck   2. Trapezius muscle spasm    Hx and exam c/w muscle strain and spasm. No bony tenderness. No red flag symptoms.  Rx: Flexeril and ibuprofen  Home care instructions provided f/u with PCP or Sports Medicine in 1 week if not improving.    Noland Fordyce, PA-C 08/08/16 [phone]

## 2016-12-23 DIAGNOSIS — R5383 Other fatigue: Secondary | ICD-10-CM | POA: Diagnosis not present

## 2016-12-23 DIAGNOSIS — Z6841 Body Mass Index (BMI) 40.0 and over, adult: Secondary | ICD-10-CM | POA: Diagnosis not present

## 2016-12-23 DIAGNOSIS — E041 Nontoxic single thyroid nodule: Secondary | ICD-10-CM | POA: Diagnosis not present

## 2016-12-23 DIAGNOSIS — R102 Pelvic and perineal pain: Secondary | ICD-10-CM | POA: Diagnosis not present

## 2016-12-23 DIAGNOSIS — E669 Obesity, unspecified: Secondary | ICD-10-CM | POA: Diagnosis not present

## 2016-12-23 DIAGNOSIS — E049 Nontoxic goiter, unspecified: Secondary | ICD-10-CM | POA: Diagnosis not present

## 2016-12-23 DIAGNOSIS — G4733 Obstructive sleep apnea (adult) (pediatric): Secondary | ICD-10-CM | POA: Diagnosis not present

## 2017-01-05 DIAGNOSIS — I493 Ventricular premature depolarization: Secondary | ICD-10-CM | POA: Diagnosis not present

## 2017-01-05 DIAGNOSIS — Z91013 Allergy to seafood: Secondary | ICD-10-CM | POA: Diagnosis not present

## 2017-01-05 DIAGNOSIS — I10 Essential (primary) hypertension: Secondary | ICD-10-CM | POA: Diagnosis not present

## 2017-01-05 DIAGNOSIS — Z9049 Acquired absence of other specified parts of digestive tract: Secondary | ICD-10-CM | POA: Diagnosis not present

## 2017-01-05 DIAGNOSIS — R072 Precordial pain: Secondary | ICD-10-CM | POA: Diagnosis not present

## 2017-01-05 DIAGNOSIS — R0789 Other chest pain: Secondary | ICD-10-CM | POA: Diagnosis not present

## 2017-01-05 DIAGNOSIS — R0989 Other specified symptoms and signs involving the circulatory and respiratory systems: Secondary | ICD-10-CM | POA: Diagnosis not present

## 2017-01-05 DIAGNOSIS — Z880 Allergy status to penicillin: Secondary | ICD-10-CM | POA: Diagnosis not present

## 2017-01-05 DIAGNOSIS — R7989 Other specified abnormal findings of blood chemistry: Secondary | ICD-10-CM | POA: Diagnosis not present

## 2017-01-05 DIAGNOSIS — I248 Other forms of acute ischemic heart disease: Secondary | ICD-10-CM | POA: Diagnosis not present

## 2017-01-05 DIAGNOSIS — R002 Palpitations: Secondary | ICD-10-CM | POA: Diagnosis not present

## 2017-01-05 DIAGNOSIS — R079 Chest pain, unspecified: Secondary | ICD-10-CM | POA: Diagnosis not present

## 2017-01-05 DIAGNOSIS — Z79899 Other long term (current) drug therapy: Secondary | ICD-10-CM | POA: Diagnosis not present

## 2017-01-05 DIAGNOSIS — I471 Supraventricular tachycardia: Secondary | ICD-10-CM | POA: Diagnosis not present

## 2017-01-05 DIAGNOSIS — J302 Other seasonal allergic rhinitis: Secondary | ICD-10-CM | POA: Diagnosis not present

## 2017-01-05 DIAGNOSIS — R12 Heartburn: Secondary | ICD-10-CM | POA: Diagnosis not present

## 2017-01-05 DIAGNOSIS — K7581 Nonalcoholic steatohepatitis (NASH): Secondary | ICD-10-CM | POA: Diagnosis not present

## 2017-01-05 DIAGNOSIS — E669 Obesity, unspecified: Secondary | ICD-10-CM | POA: Diagnosis not present

## 2017-01-05 DIAGNOSIS — Z6841 Body Mass Index (BMI) 40.0 and over, adult: Secondary | ICD-10-CM | POA: Diagnosis not present

## 2017-01-05 DIAGNOSIS — Z881 Allergy status to other antibiotic agents status: Secondary | ICD-10-CM | POA: Diagnosis not present

## 2017-01-06 DIAGNOSIS — I471 Supraventricular tachycardia: Secondary | ICD-10-CM | POA: Diagnosis not present

## 2017-01-06 DIAGNOSIS — R7989 Other specified abnormal findings of blood chemistry: Secondary | ICD-10-CM | POA: Diagnosis not present

## 2017-01-06 DIAGNOSIS — I493 Ventricular premature depolarization: Secondary | ICD-10-CM | POA: Diagnosis not present

## 2017-01-06 DIAGNOSIS — R079 Chest pain, unspecified: Secondary | ICD-10-CM | POA: Diagnosis not present

## 2017-01-12 DIAGNOSIS — G4733 Obstructive sleep apnea (adult) (pediatric): Secondary | ICD-10-CM | POA: Diagnosis not present

## 2017-01-31 DIAGNOSIS — N760 Acute vaginitis: Secondary | ICD-10-CM | POA: Diagnosis not present

## 2017-01-31 DIAGNOSIS — L02818 Cutaneous abscess of other sites: Secondary | ICD-10-CM | POA: Diagnosis not present

## 2017-01-31 DIAGNOSIS — B9689 Other specified bacterial agents as the cause of diseases classified elsewhere: Secondary | ICD-10-CM | POA: Diagnosis not present

## 2017-01-31 DIAGNOSIS — N898 Other specified noninflammatory disorders of vagina: Secondary | ICD-10-CM | POA: Diagnosis not present

## 2017-02-03 DIAGNOSIS — I471 Supraventricular tachycardia: Secondary | ICD-10-CM | POA: Diagnosis not present

## 2017-02-09 DIAGNOSIS — G4733 Obstructive sleep apnea (adult) (pediatric): Secondary | ICD-10-CM | POA: Diagnosis not present

## 2017-02-24 DIAGNOSIS — I471 Supraventricular tachycardia: Secondary | ICD-10-CM | POA: Diagnosis not present

## 2017-03-02 DIAGNOSIS — Z1151 Encounter for screening for human papillomavirus (HPV): Secondary | ICD-10-CM | POA: Diagnosis not present

## 2017-03-02 DIAGNOSIS — B373 Candidiasis of vulva and vagina: Secondary | ICD-10-CM | POA: Diagnosis not present

## 2017-03-02 DIAGNOSIS — N898 Other specified noninflammatory disorders of vagina: Secondary | ICD-10-CM | POA: Diagnosis not present

## 2017-03-02 DIAGNOSIS — Z124 Encounter for screening for malignant neoplasm of cervix: Secondary | ICD-10-CM | POA: Diagnosis not present

## 2017-03-02 DIAGNOSIS — Z01419 Encounter for gynecological examination (general) (routine) without abnormal findings: Secondary | ICD-10-CM | POA: Diagnosis not present

## 2017-03-14 DIAGNOSIS — H5213 Myopia, bilateral: Secondary | ICD-10-CM | POA: Diagnosis not present

## 2017-03-17 DIAGNOSIS — G4733 Obstructive sleep apnea (adult) (pediatric): Secondary | ICD-10-CM | POA: Diagnosis not present

## 2017-05-03 ENCOUNTER — Encounter: Payer: Self-pay | Admitting: Emergency Medicine

## 2017-05-03 ENCOUNTER — Other Ambulatory Visit: Payer: Self-pay

## 2017-05-03 ENCOUNTER — Emergency Department
Admission: EM | Admit: 2017-05-03 | Discharge: 2017-05-03 | Disposition: A | Discharge status: Home / Self Care | Payer: BLUE CROSS/BLUE SHIELD | Source: Home / Self Care | Attending: Family Medicine | Admitting: Family Medicine

## 2017-05-03 DIAGNOSIS — B9789 Other viral agents as the cause of diseases classified elsewhere: Secondary | ICD-10-CM | POA: Diagnosis not present

## 2017-05-03 DIAGNOSIS — J069 Acute upper respiratory infection, unspecified: Secondary | ICD-10-CM

## 2017-05-03 MED ORDER — AZITHROMYCIN 250 MG PO TABS: ORAL_TABLET | ORAL | 0 refills | Status: DC

## 2017-05-03 NOTE — Discharge Instructions (Signed)
Continue plain guaifenesin (1200mg  extended release tabs such as Mucinex) twice daily, with plenty of water, for cough and congestion.  Get adequate rest.   May use Afrin nasal spray (or generic oxymetazoline) each morning for about 5 days and then discontinue.  Also recommend using saline nasal spray several times daily and saline nasal irrigation (AYR is a common brand).  Use Flonase nasal spray each morning after using Afrin nasal spray and saline nasal irrigation. Try warm salt water gargles for sore throat.  Stop all antihistamines for now, and other non-prescription cough/cold preparations. May take Delsym Cough Suppressant at bedtime for nighttime cough.

## 2017-05-03 NOTE — ED Triage Notes (Signed)
Dry cough, congestion, watery eyes x 1.5 days

## 2017-05-03 NOTE — ED Provider Notes (Signed)
[name]    CSN: [id number] Arrival date & time: 05/03/17  0907     History   Chief Complaint Chief Complaint  Patient presents with  . Cough    HPI Jacqueline Griffin is a 42 y.o. female.   Patient had a cold on 04/22/16 that lasted several days.  She developed recurrent cold-like symptoms 3 days ago. She has a past history of pneumonia in 2009, and notes that she had minimal symptoms at that time.   The history is provided by the patient.    Past Medical History:  Diagnosis Date  . Supraventricular tachycardia ([place]) 1997  . Uterine fibroid     Patient Active Problem List   Diagnosis Date Noted  . Menorrhagia 09/25/2010  . SAB (spontaneous abortion) 09/25/2010    Past Surgical History:  Procedure Laterality Date  . CESAREAN SECTION    . HIP SURGERY    . MANDIBLE RECONSTRUCTION  42 yo    OB History    Gravida Para Term Preterm AB Living   2       1 0   SAB TAB Ectopic Multiple Live Births   1               Home Medications    Prior to Admission medications   Medication Sig Start Date End Date Taking? Authorizing Provider  azithromycin (ZITHROMAX Z-PAK) 250 MG tablet Take 2 tabs today; then begin one tab once daily for 4 more days. 05/03/17   Kandra Nicolas, MD  ibuprofen (ADVIL,MOTRIN) 600 MG tablet Take 1 tablet (600 mg total) by mouth every 6 (six) hours as needed. 08/08/16   Noe Gens, PA-C  metoprolol (LOPRESSOR) 50 MG tablet Take 50 mg by mouth 2 (two) times daily.      [provider]  naproxen (NAPROSYN) 500 MG tablet Take 1 tablet (500 mg total) by mouth 2 (two) times daily. 08/14/15   Fredia Sorrow, MD    Family History Family History  Problem Relation Age of Onset  . Hypertension Father   . Leukemia Mother     Social History Social History   Tobacco Use  . Smoking status: Never Smoker  . Smokeless tobacco: Never Used  Substance Use Topics  . Alcohol use: No  . Drug use: No     Allergies   Shrimp  [shellfish allergy]; Amoxicillin; and Clindamycin/lincomycin   Review of Systems Review of Systems + sore throat + cough No pleuritic pain No wheezing + nasal congestion + post-nasal drainage No sinus pain/pressure No itchy/red eyes No earache No hemoptysis No SOB No fever, + chills No nausea No vomiting No abdominal pain No diarrhea No urinary symptoms No skin rash + fatigue No myalgias + headache Used OTC meds without relief   Physical Exam Triage Vital Signs ED Triage Vitals  Enc Vitals Group     BP 05/03/17 0952 127/64     Pulse Rate 05/03/17 0952 82     Resp --      Temp 05/03/17 0952 98 F (36.7 C)     Temp Source 05/03/17 0952 Oral     SpO2 05/03/17 0952 99 %     Weight 05/03/17 0958 254 lb (115.2 kg)     Height 05/03/17 0958 5' 7"  (1.702 m)     Head Circumference --      Peak Flow --      Pain Score 05/03/17 0953 0     Pain Loc --  Pain Edu? --      Excl. in [place]? --    No data found.  Updated Vital Signs BP 127/64 (BP Location: Right Arm)   Pulse 82   Temp 98 F (36.7 C) (Oral)   Ht 5' 7"  (1.702 m)   Wt 254 lb (115.2 kg)   LMP 04/15/2017 (Exact Date)   SpO2 99%   BMI 39.78 kg/m   Visual Acuity Right Eye Distance:   Left Eye Distance:   Bilateral Distance:    Right Eye Near:   Left Eye Near:    Bilateral Near:     Physical Exam Nursing notes and Vital Signs reviewed. Appearance:  Patient appears stated age, and in no acute distress Eyes:  Pupils are equal, round, and reactive to light and accomodation.  Extraocular movement is intact.  Conjunctivae are not inflamed  Ears:  Canals normal.  Tympanic membranes normal.  Nose:  Congested turbinates.  No sinus tenderness.  Pharynx:  Normal Neck:  Supple.  Enlarged posterior/lateral nodes are palpated bilaterally, tender to palpation on the left.   Lungs:  Clear to auscultation.  Breath sounds are equal.  Moving air well. Heart:  Regular rate and rhythm without murmurs, rubs, or  gallops.  Abdomen:  Nontender without masses or hepatosplenomegaly.  Bowel sounds are present.  No CVA or flank tenderness.  Extremities:  No edema.  Skin:  No rash present.    UC Treatments / Results  Labs (all labs ordered are listed, but only abnormal results are displayed) Labs Reviewed - No data to display  EKG  EKG Interpretation None       Radiology No results found.  Procedures Procedures (including critical care time)  Medications Ordered in UC Medications - No data to display   Initial Impression / Assessment and Plan / UC Course  I have reviewed the triage vital signs and the nursing notes.  Pertinent labs & imaging results that were available during my care of the patient were reviewed by me and considered in my medical decision making (see chart for details).    Patient appears to have a second viral URI in a short period of time. Because of her past history of pneumonia, will begin empiric Z-pak. Continue plain guaifenesin (1272m extended release tabs such as Mucinex) twice daily, with plenty of water, for cough and congestion.  Get adequate rest.   May use Afrin nasal spray (or generic oxymetazoline) each morning for about 5 days and then discontinue.  Also recommend using saline nasal spray several times daily and saline nasal irrigation (AYR is a common brand).  Use Flonase nasal spray each morning after using Afrin nasal spray and saline nasal irrigation. Try warm salt water gargles for sore throat.  Stop all antihistamines for now, and other non-prescription cough/cold preparations. May take Delsym Cough Suppressant at bedtime for nighttime cough.  Followup with Family Doctor if not improved in one week.   Final Clinical Impressions(s) / UC Diagnoses   Final diagnoses:  Viral URI with cough    ED Discharge Orders        Ordered    azithromycin (ZITHROMAX Z-PAK) 250 MG tablet     05/03/17 1038           BKandra Nicolas MD 05/03/17  1043

## 2017-05-05 DIAGNOSIS — I471 Supraventricular tachycardia: Secondary | ICD-10-CM | POA: Diagnosis not present

## 2017-06-09 DIAGNOSIS — H66001 Acute suppurative otitis media without spontaneous rupture of ear drum, right ear: Secondary | ICD-10-CM | POA: Diagnosis not present

## 2017-06-09 DIAGNOSIS — J029 Acute pharyngitis, unspecified: Secondary | ICD-10-CM | POA: Diagnosis not present

## 2017-07-01 DIAGNOSIS — J020 Streptococcal pharyngitis: Secondary | ICD-10-CM | POA: Diagnosis not present

## 2017-07-07 DIAGNOSIS — H66002 Acute suppurative otitis media without spontaneous rupture of ear drum, left ear: Secondary | ICD-10-CM | POA: Diagnosis not present

## 2017-07-08 ENCOUNTER — Other Ambulatory Visit: Payer: Self-pay

## 2017-07-08 ENCOUNTER — Emergency Department
Admission: EM | Admit: 2017-07-08 | Discharge: 2017-07-08 | Disposition: A | Discharge status: Home / Self Care | Payer: BLUE CROSS/BLUE SHIELD | Source: Home / Self Care | Attending: Emergency Medicine | Admitting: Emergency Medicine

## 2017-07-08 DIAGNOSIS — J039 Acute tonsillitis, unspecified: Secondary | ICD-10-CM

## 2017-07-08 DIAGNOSIS — J020 Streptococcal pharyngitis: Secondary | ICD-10-CM

## 2017-07-08 LAB — POCT CBC W AUTO DIFF (K'VILLE URGENT CARE)

## 2017-07-08 LAB — POCT RAPID STREP A (OFFICE): RAPID STREP A SCREEN: NEGATIVE

## 2017-07-08 LAB — POCT MONO SCREEN (KUC): Mono, POC: NEGATIVE

## 2017-07-08 MED ORDER — DOXYCYCLINE HYCLATE 100 MG PO CAPS: 100.0000 mg | ORAL_CAPSULE | Freq: Two times a day (BID) | ORAL | 0 refills | Status: DC

## 2017-07-08 MED ORDER — FERROUS SULFATE 325 (65 FE) MG PO TABS: 325.0000 mg | ORAL_TABLET | Freq: Every day | ORAL | 5 refills | Status: DC

## 2017-07-08 NOTE — Discharge Instructions (Addendum)
Take doxycycline twice a day with food but not with any milk products. Follow-up with your family physician after treatment to be sure the strep has resolved. Gargle with warm salt water. You can take Tylenol or ibuprofen which ever you prefer for throat discomfort I have sent in a prescription for iron pills to take 1 a day. Please have your white cell count followed up in [redacted] weeks along with repeat strep culture.

## 2017-07-08 NOTE — ED Provider Notes (Signed)
[facility]    CSN: [id number] Arrival date & time: 07/08/17  0934     History   Chief Complaint Chief Complaint  Patient presents with  . Sore Throat    HPI Jacqueline Griffin is a 42 y.o. female.  Patient enters with a severe sore throat.  [facility] and treated with a Z-Pak which she finished on Wednesday.  She presents today with persistent sore throat and discomfort.  She has taken 3 courses of Zithromax over the last year.  She feels swollen in her throat and feels that her glands are swollen.  Sore Throat     Past Medical History:  Diagnosis Date  . Supraventricular tachycardia ([place]) 1997  . Uterine fibroid     Patient Active Problem List   Diagnosis Date Noted  . Menorrhagia 09/25/2010  . SAB (spontaneous abortion) 09/25/2010    Past Surgical History:  Procedure Laterality Date  . CESAREAN SECTION    . HIP SURGERY    . MANDIBLE RECONSTRUCTION  42 yo    OB History    Gravida  2   Para      Term      Preterm      AB  1   Living  0     SAB  1   TAB      Ectopic      Multiple      Live Births               Home Medications    Prior to Admission medications   Medication Sig Start Date End Date Taking? Authorizing Provider  azithromycin (ZITHROMAX Z-PAK) 250 MG tablet Take 2 tabs today; then begin one tab once daily for 4 more days. 05/03/17   Kandra Nicolas, MD  doxycycline (VIBRAMYCIN) 100 MG capsule Take 1 capsule (100 mg total) by mouth 2 (two) times daily. 07/08/17   Darlyne Russian, MD  ibuprofen (ADVIL,MOTRIN) 600 MG tablet Take 1 tablet (600 mg total) by mouth every 6 (six) hours as needed. 08/08/16   Noe Gens, PA-C  metoprolol (LOPRESSOR) 50 MG tablet Take 50 mg by mouth 2 (two) times daily.      [provider]  naproxen (NAPROSYN) 500 MG tablet Take 1 tablet (500 mg total) by mouth 2 (two) times daily. 08/14/15   Fredia Sorrow, MD     Family History Family History  Problem Relation Age of Onset  . Hypertension Father   . Leukemia Mother     Social History Social History   Tobacco Use  . Smoking status: Never Smoker  . Smokeless tobacco: Never Used  Substance Use Topics  . Alcohol use: No  . Drug use: No     Allergies   Shrimp [shellfish allergy]; Amoxicillin; and Clindamycin/lincomycin   Review of Systems Review of Systems  Constitutional: Positive for fever.  HENT: Positive for ear pain and sore throat.   Eyes: Negative.   Respiratory: Negative for cough.   Cardiovascular:       She has a history of SVT but not recently.  Gastrointestinal: Negative.      Physical Exam Triage Vital Signs ED Triage Vitals [07/08/17 0959]  Enc Vitals Group     BP 137/77     Pulse Rate 80     Resp 18     Temp 99 F (37.2 C)     Temp Source Oral  SpO2 100 %     Weight 254 lb (115.2 kg)     Height 5\' 6"  (1.676 m)     Head Circumference      Peak Flow      Pain Score 0     Pain Loc      Pain Edu?      Excl. in [place]?    No data found.  Updated Vital Signs BP 137/77 (BP Location: Right Arm)   Pulse 80   Temp 99 F (37.2 C) (Oral)   Resp 18   Ht 5\' 6"  (1.676 m)   Wt 254 lb (115.2 kg)   SpO2 100%   BMI 41.00 kg/m   Visual Acuity Right Eye Distance:   Left Eye Distance:   Bilateral Distance:    Right Eye Near:   Left Eye Near:    Bilateral Near:     Physical Exam  Constitutional: She appears well-developed and well-nourished.  HENT:  Head: Normocephalic.  Right Ear: Tympanic membrane normal.  Left Ear: Tympanic membrane normal.  Mouth/Throat: Mucous membranes are normal. No oral lesions. Oropharyngeal exudate and posterior oropharyngeal erythema present. No tonsillar abscesses. Tonsillar exudate.  Neck: No thyromegaly present.  Lymphadenopathy:    She has cervical adenopathy.     UC Treatments / Results  Labs (all labs ordered are listed, but only abnormal results are  displayed) Labs Reviewed  STREP A DNA PROBE  POCT RAPID STREP A (OFFICE)    EKG None Radiology No results found.  Procedures Procedures (including critical care time)  Medications Ordered in UC Medications - No data to display   Initial Impression / Assessment and Plan / UC Course  I have reviewed the triage vital signs and the nursing notes.  Pertinent labs & imaging results that were available during my care of the patient were reviewed by me and considered in my medical decision making (see chart for details). This patient has had 3 courses of Zithromax in the last 4 months.  She recently tested positive for strep and was treated with Zithromax.  She enters today with persistent sore throat and swelling.  She is allergic to amoxicillin and had SVT when she took clindamycin.  She has been on Zithromax and it did not seem to help.  Will treat with doxycycline for 10 days which is an alternative treatment for strep throat with multiple drug allergies.  Her strep screen was negative.  Mono test was negative.  White count 14,600 with a hemoglobin 11.6 and normal platelet count.  She was also given a prescription for iron.      Final Clinical Impressions(s) / UC Diagnoses   Final diagnoses:  Acute tonsillitis, unspecified etiology    ED Discharge Orders        Ordered    doxycycline (VIBRAMYCIN) 100 MG capsule  2 times daily     07/08/17 1117       Controlled Substance Prescriptions [place] Controlled Substance Registry consulted? Not Applicable   Darlyne Russian, MD 07/08/17 1406

## 2017-07-08 NOTE — ED Triage Notes (Signed)
[facility] and [facility]. Was given a z pak, but still presents with sore throat and low grade fever today.

## 2017-07-10 ENCOUNTER — Telehealth: Payer: Self-pay | Admitting: Emergency Medicine

## 2017-07-10 LAB — STREP A DNA PROBE: GROUP A STREP PROBE: DETECTED — AB

## 2017-07-31 DIAGNOSIS — F411 Generalized anxiety disorder: Secondary | ICD-10-CM | POA: Diagnosis not present

## 2017-08-07 DIAGNOSIS — F411 Generalized anxiety disorder: Secondary | ICD-10-CM | POA: Diagnosis not present

## 2017-08-14 DIAGNOSIS — F411 Generalized anxiety disorder: Secondary | ICD-10-CM | POA: Diagnosis not present

## 2017-08-15 DIAGNOSIS — G4733 Obstructive sleep apnea (adult) (pediatric): Secondary | ICD-10-CM | POA: Diagnosis not present

## 2017-08-21 DIAGNOSIS — F411 Generalized anxiety disorder: Secondary | ICD-10-CM | POA: Diagnosis not present

## 2017-08-24 DIAGNOSIS — N9089 Other specified noninflammatory disorders of vulva and perineum: Secondary | ICD-10-CM | POA: Diagnosis not present

## 2017-08-24 DIAGNOSIS — L739 Follicular disorder, unspecified: Secondary | ICD-10-CM | POA: Diagnosis not present

## 2017-08-24 DIAGNOSIS — N898 Other specified noninflammatory disorders of vagina: Secondary | ICD-10-CM | POA: Diagnosis not present

## 2017-08-28 DIAGNOSIS — F411 Generalized anxiety disorder: Secondary | ICD-10-CM | POA: Diagnosis not present

## 2017-09-04 DIAGNOSIS — F411 Generalized anxiety disorder: Secondary | ICD-10-CM | POA: Diagnosis not present

## 2017-09-12 DIAGNOSIS — J039 Acute tonsillitis, unspecified: Secondary | ICD-10-CM | POA: Diagnosis not present

## 2017-09-12 DIAGNOSIS — J029 Acute pharyngitis, unspecified: Secondary | ICD-10-CM | POA: Diagnosis not present

## 2017-09-12 DIAGNOSIS — J0301 Acute recurrent streptococcal tonsillitis: Secondary | ICD-10-CM | POA: Diagnosis not present

## 2017-09-15 DIAGNOSIS — F411 Generalized anxiety disorder: Secondary | ICD-10-CM | POA: Diagnosis not present

## 2017-09-16 ENCOUNTER — Emergency Department
Admission: EM | Admit: 2017-09-16 | Discharge: 2017-09-16 | Disposition: A | Discharge status: Home / Self Care | Payer: BLUE CROSS/BLUE SHIELD | Source: Home / Self Care | Attending: Family Medicine | Admitting: Family Medicine

## 2017-09-16 ENCOUNTER — Emergency Department (INDEPENDENT_AMBULATORY_CARE_PROVIDER_SITE_OTHER): Payer: BLUE CROSS/BLUE SHIELD

## 2017-09-16 ENCOUNTER — Other Ambulatory Visit: Payer: Self-pay

## 2017-09-16 DIAGNOSIS — M19011 Primary osteoarthritis, right shoulder: Secondary | ICD-10-CM | POA: Diagnosis not present

## 2017-09-16 DIAGNOSIS — S43401A Unspecified sprain of right shoulder joint, initial encounter: Secondary | ICD-10-CM

## 2017-09-16 DIAGNOSIS — M7581 Other shoulder lesions, right shoulder: Secondary | ICD-10-CM

## 2017-09-16 DIAGNOSIS — M25511 Pain in right shoulder: Secondary | ICD-10-CM | POA: Diagnosis not present

## 2017-09-16 NOTE — ED Provider Notes (Signed)
[facility]    CSN: [id number] Arrival date & time: 09/16/17  1225     History   Chief Complaint Chief Complaint  Patient presents with  . Shoulder Pain    HPI Jacqueline Griffin is a 42 y.o. female.   At about 3:30am today patient picked up 4 to 5 heavy boxes in a closet.  She went back to bed and awoke at 7am with right shoulder pain.  She has had persistent pain and limited range of motion since then.   The history is provided by the patient.  Shoulder Pain  Location:  Shoulder Shoulder location:  R shoulder Injury: yes   Time since incident:  10 hours Mechanism of injury comment:  Heavy lifting Pain details:    Quality:  Aching   Radiates to:  Does not radiate   Severity:  Severe   Onset quality:  Sudden   Duration:  6 hours   Timing:  Constant   Progression:  Unchanged Handedness:  Right-handed Dislocation: no   Prior injury to area:  No Relieved by:  Nothing Exacerbated by: abducting shoulder. Ineffective treatments:  NSAIDs Associated symptoms: decreased range of motion   Associated symptoms: no muscle weakness, no numbness, no stiffness, no swelling and no tingling     Past Medical History:  Diagnosis Date  . Supraventricular tachycardia ([place]) 1997  . Uterine fibroid     Patient Active Problem List   Diagnosis Date Noted  . Menorrhagia 09/25/2010  . SAB (spontaneous abortion) 09/25/2010    Past Surgical History:  Procedure Laterality Date  . CESAREAN SECTION    . HIP SURGERY    . MANDIBLE RECONSTRUCTION  42 yo    OB History    Gravida  2   Para      Term      Preterm      AB  1   Living  0     SAB  1   TAB      Ectopic      Multiple      Live Births               Home Medications    Prior to Admission medications   Medication Sig Start Date End Date Taking? Authorizing Provider  azithromycin (ZITHROMAX Z-PAK) 250 MG tablet Take 2 tabs today; then begin one tab once daily for 4 more days. 05/03/17    Kandra Nicolas, MD  doxycycline (VIBRAMYCIN) 100 MG capsule Take 1 capsule (100 mg total) by mouth 2 (two) times daily. 07/08/17   Darlyne Russian, MD  ferrous sulfate 325 (65 FE) MG tablet Take 1 tablet (325 mg total) by mouth daily. 07/08/17   Darlyne Russian, MD  ibuprofen (ADVIL,MOTRIN) 600 MG tablet Take 1 tablet (600 mg total) by mouth every 6 (six) hours as needed. 08/08/16   Noe Gens, PA-C  metoprolol (LOPRESSOR) 50 MG tablet Take 50 mg by mouth 2 (two) times daily.      [provider]  naproxen (NAPROSYN) 500 MG tablet Take 1 tablet (500 mg total) by mouth 2 (two) times daily. 08/14/15   Fredia Sorrow, MD    Family History Family History  Problem Relation Age of Onset  . Hypertension Father   . Leukemia Mother     Social History Social History   Tobacco Use  . Smoking status: Never Smoker  . Smokeless tobacco: Never Used  Substance Use Topics  . Alcohol use: No  .  Drug use: No     Allergies   Shrimp [shellfish allergy]; Amoxicillin; and Clindamycin/lincomycin   Review of Systems Review of Systems  Musculoskeletal: Negative for stiffness.  All other systems reviewed and are negative.    Physical Exam Triage Vital Signs ED Triage Vitals [09/16/17 1311]  Enc Vitals Group     BP (!) 146/82     Pulse Rate 77     Resp 18     Temp 98.5 F (36.9 C)     Temp Source Oral     SpO2 99 %     Weight 259 lb (117.5 kg)     Height 5\' 6"  (1.676 m)     Head Circumference      Peak Flow      Pain Score 0     Pain Loc      Pain Edu?      Excl. in [place]?    No data found.  Updated Vital Signs BP (!) 146/82 (BP Location: Right Arm)   Pulse 77   Temp 98.5 F (36.9 C) (Oral)   Resp 18   Ht 5\' 6"  (1.676 m)   Wt 259 lb (117.5 kg)   LMP 08/22/2017 (Exact Date)   SpO2 99%   BMI 41.80 kg/m   Visual Acuity Right Eye Distance:   Left Eye Distance:   Bilateral Distance:    Right Eye Near:   Left Eye Near:    Bilateral Near:     Physical Exam    Constitutional: She appears well-developed and well-nourished. No distress.  HENT:  Head: Normocephalic.  Mouth/Throat: Oropharynx is clear and moist.  Eyes: Pupils are equal, round, and reactive to light.  Neck: Normal range of motion.  Cardiovascular: Normal heart sounds.  Pulmonary/Chest: Breath sounds normal.  Musculoskeletal:       Right shoulder: She exhibits decreased range of motion and tenderness. She exhibits no swelling.  Patient has difficulty actively and passively abducting right arm above horizontal.  She has difficulty performing Apley's test.  There is tenderness to palpation over the subacromial bursa.  Neurological: She is alert.  Skin: Skin is warm and dry.  Nursing note and vitals reviewed.    UC Treatments / Results  Labs (all labs ordered are listed, but only abnormal results are displayed) Labs Reviewed - No data to display  EKG None  Radiology Dg Shoulder Right  Result Date: 09/16/2017 CLINICAL DATA:  Pt was lifting some totes this am and injured her RT shoulder. Decreased range of motion. No hx surg. EXAM: RIGHT SHOULDER - 2+ VIEW COMPARISON:  None. FINDINGS: No fracture.  No bone lesion. Glenohumeral joint is normally spaced and aligned. Mild narrowing of the [facility] joint consistent with mild osteoarthritis. There are calcifications adjacent to the superior greater tuberosity of the humerus consistent with rotator cuff calcific tendinitis. Soft tissues are otherwise unremarkable. IMPRESSION: 1. No fracture or acute finding. 2. Findings consistent with rotator cuff calcific tendinitis. 3. Mild AC joint osteoarthritis. Electronically Signed   By: Lajean Manes M.D.   On: 09/16/2017 14:32    Procedures Procedures (including critical care time)  Medications Ordered in UC Medications - No data to display  Initial Impression / Assessment and Plan / UC Course  I have reviewed the triage vital signs and the nursing notes.  Pertinent labs & imaging results that  were available during my care of the patient were reviewed by me and considered in my medical decision making (see chart for details).  Dispensed sling. Followup with Dr. Aundria Mems or Dr. Lynne Leader ([facility]) if not improving about two weeks.    Final Clinical Impressions(s) / UC Diagnoses   Final diagnoses:  Sprain of right shoulder, unspecified shoulder sprain type, initial encounter  Rotator cuff tendonitis, right     Discharge Instructions     Wear sling for 3 to 5 days.  Apply ice pack for 20 to 30 minutes, 3 to 4 times daily  Continue until pain and swelling decrease.  Begin Ibuprofen 200mg , 4 tabs every 8 hours with food.  May take Tylenol at bedtime for pain.  Begin range of motion and stretching exercises as tolerated.    ED Prescriptions    None       Kandra Nicolas, MD 09/18/17 1635

## 2017-09-16 NOTE — Discharge Instructions (Addendum)
Wear sling for 3 to 5 days.  Apply ice pack for 20 to 30 minutes, 3 to 4 times daily  Continue until pain and swelling decrease.  Begin Ibuprofen 200mg , 4 tabs every 8 hours with food.  May take Tylenol at bedtime for pain.  Begin range of motion and stretching exercises as tolerated.

## 2017-09-16 NOTE — ED Triage Notes (Signed)
Pt c/o R shoulder pain since this morning. Was lifting heavy totes at 3am. Then woke up to severe should pain with limited ROM. Tried tylenol and a heating pad with no relief.

## 2017-09-18 ENCOUNTER — Encounter: Payer: Self-pay | Admitting: Family Medicine

## 2017-09-18 ENCOUNTER — Ambulatory Visit: Payer: BLUE CROSS/BLUE SHIELD | Admitting: Family Medicine

## 2017-09-18 VITALS — BP 138/81 | HR 98 | Temp 98.0°F | Ht 66.0 in | Wt 259.0 lb

## 2017-09-18 DIAGNOSIS — M7581 Other shoulder lesions, right shoulder: Secondary | ICD-10-CM | POA: Diagnosis not present

## 2017-09-18 DIAGNOSIS — R12 Heartburn: Secondary | ICD-10-CM | POA: Insufficient documentation

## 2017-09-18 DIAGNOSIS — H9190 Unspecified hearing loss, unspecified ear: Secondary | ICD-10-CM | POA: Insufficient documentation

## 2017-09-18 DIAGNOSIS — I10 Essential (primary) hypertension: Secondary | ICD-10-CM | POA: Insufficient documentation

## 2017-09-18 HISTORY — DX: Essential (primary) hypertension: I10

## 2017-09-18 MED ORDER — DICLOFENAC SODIUM 1 % TD GEL: 2.0000 g | Freq: Four times a day (QID) | TRANSDERMAL | 11 refills | Status: DC

## 2017-09-18 NOTE — Progress Notes (Signed)
Subjective:    I'm seeing this patient as a consultation for:  Dr Assunta Found  CC: Right shoulder pain  HPI: Jacqueline Griffin is a 42 year old right-hand-dominant [employer].  She notes a recent history of right shoulder pain.  She denies any injury but notes that after she was doing some lifting the next day she had significant pain in her right lateral upper arm and shoulder.  She notes the pain radiates to the upper arm but not past the elbow.  She denies any weakness or numbness distally.  She was seen in urgent care on June 22 where x-rays showed calcific tendinosis of the supraspinatus tendon and mild AC DJD but otherwise was largely unremarkable with no acute findings.  She notes the pain is quite severe and worse with overhead motion and reaching back.  She has trouble getting her bra on and getting dressed.  She is not been able to work since the pain has been so severe.  In urgent care she was prescribed home exercise program ibuprofen and sling which helped a little.  Past medical history, Surgical history, Family history not pertinant except as noted below, Social history, Allergies, and medications have been entered into the medical record, reviewed, and no changes needed.   Review of Systems: No headache, visual changes, nausea, vomiting, diarrhea, constipation, dizziness, abdominal pain, skin rash, fevers, chills, night sweats, weight loss, swollen lymph nodes, body aches, joint swelling, muscle aches, chest pain, shortness of breath, mood changes, visual or auditory hallucinations.   Objective:    Vitals:   09/18/17 1458  BP: 138/81  Pulse: 98  Temp: 98 F (36.7 C)   General: Well Developed, well nourished, and in no acute distress.  Neuro/Psych: Alert and oriented x3, extra-ocular muscles intact, able to move all 4 extremities, sensation grossly intact. Skin: Warm and dry, no rashes noted.  Respiratory: Not using accessory muscles, speaking in full sentences, trachea  midline.  Cardiovascular: Pulses palpable, no extremity edema. Abdomen: Does not appear distended. MSK:  Right shoulder: Normal-appearing with no swelling or ecchymosis. Range of motion: Considerable pain to abduction both active and passive beyond about 30 degrees.  Patient guards with further range of motion. External rotation normal Internal rotation limited to the iliac crest.  Hawkins and Neer's test difficult to perform due to patient guarding and pain with positioning. Empty can testing difficult to ascertain due to pain and guarding. Negative Yergason's and speeds test. Strength intact to internal and external rotation and abduction but very painful to external rotation and abduction.  Pulses cap refill and sensation intact distally. Contralateral left shoulder and bilateral elbows and wrists normal motion normal strength.  Procedure: Real-time Ultrasound Guided Injection of right subacromial bursa  Device: GE Logiq E   Images permanently stored and available for review in the ultrasound unit. Verbal informed consent obtained.  Discussed risks and benefits of procedure. Warned about infection bleeding damage to structures skin hypopigmentation and fat atrophy among others. Patient expresses understanding and agreement Time-out conducted.   Noted no overlying erythema, induration, or other signs of local infection.   Skin prepped in a sterile fashion.   Local anesthesia: Topical Ethyl chloride.   With sterile technique and under real time ultrasound guidance:  40mg  kenalog and 55ml marcaine injected easily.   Completed without difficulty   Pain  Partially immediately resolved suggesting accurate placement of the medication.   Advised to call if fevers/chills, erythema, induration, drainage, or persistent bleeding.   Images permanently stored and  available for review in the ultrasound unit.  Impression: Technically successful ultrasound guided injection.      Lab and  Radiology Results No results found for this or any previous visit (from the past 72 hour(s)). Dg Shoulder Right  Result Date: 09/16/2017 CLINICAL DATA:  Pt was lifting some totes this am and injured her RT shoulder. Decreased range of motion. No hx surg. EXAM: RIGHT SHOULDER - 2+ VIEW COMPARISON:  None. FINDINGS: No fracture.  No bone lesion. Glenohumeral joint is normally spaced and aligned. Mild narrowing of the [facility] joint consistent with mild osteoarthritis. There are calcifications adjacent to the superior greater tuberosity of the humerus consistent with rotator cuff calcific tendinitis. Soft tissues are otherwise unremarkable. IMPRESSION: 1. No fracture or acute finding. 2. Findings consistent with rotator cuff calcific tendinitis. 3. Mild AC joint osteoarthritis. Electronically Signed   By: Lajean Manes M.D.   On: 09/16/2017 14:32  I personally (independently) visualized and performed the interpretation of the images attached in this note.  Limited musculoskeletal ultrasound of the right shoulder  reveals an intact biceps tendon Intact subscapularis tendon Calcific supraspinatus tendinopathy with hyperechoic changes.  This is the area of maximum tenderness with ultrasound probe.  Increased Doppler activity in this area.  No full-thickness rotator cuff tear seen. Intact infraspinatus tendon. AC joint with mild effusion.  Impression and Recommendations:    Assessment and Plan: 43 y.o. female with right shoulder pain. Patient has considerable pain to abduction consistent with rotator cuff tendinitis.  She has x-ray changes consistent with calcific tendinopathy chronic appearing.  However her history does not really conform to chronic tendinitis.  She had partial response subacromial injection.  Hopefully with the steroid component as well as home exercise program diclofenac gel she will have some improvement in symptoms.  Additionally will refer to physical therapy.  Recheck in 2 weeks.  Work  note provided.  Return sooner if needed.   Orders Placed This Encounter  Procedures  . Ambulatory referral to Physical Therapy    Referral Priority:   Routine    Referral Type:   Physical Medicine    Referral Reason:   Specialty Services Required    Requested Specialty:   Physical Therapy   Meds ordered this encounter  Medications  . diclofenac sodium (VOLTAREN) 1 % GEL    Sig: Apply 2 g topically 4 (four) times daily. To affected joint.    Dispense:  100 g    Refill:  11    Discussed warning signs or symptoms. Please see discharge instructions. Patient expresses understanding.

## 2017-09-18 NOTE — Patient Instructions (Addendum)
Thank you for coming in today. Call or go to the ER if you develop a large red swollen joint with extreme pain or oozing puss.   Attend Physical Therapy.  Use the diclofenac gel.  Recheck with me in 2-4 weeks.    Rotator Cuff Tendinitis Rotator cuff tendinitis is inflammation of the tough, cord-like bands that connect muscle to bone (tendons) in the rotator cuff. The rotator cuff includes all of the muscles and tendons that connect the arm to the shoulder. The rotator cuff holds the head of the upper arm bone (humerus) in the cup (fossa) of the shoulder blade (scapula). This condition can lead to a long-lasting (chronic) tear. The tear may be partial or complete. What are the causes? This condition is usually caused by overusing the rotator cuff. What increases the risk? This condition is more likely to develop in athletes and workers who frequently use their shoulder or reach over their heads. This can include activities such as:  Tennis.  Baseball or softball.  Swimming.  Construction work.  Painting.  What are the signs or symptoms? Symptoms of this condition include:  Pain spreading (radiating) from the shoulder to the upper arm.  Swelling and tenderness in front of the shoulder.  Pain when reaching, pulling, or lifting the arm above the head.  Pain when lowering the arm from above the head.  Minor pain in the shoulder when resting.  Increased pain in the shoulder at night.  Difficulty placing the arm behind the back.  How is this diagnosed? This condition is diagnosed with a medical history and physical exam. Tests may also be done, including:  X-rays.  MRI.  Ultrasounds.  CT or MR arthrogram. During this test, a contrast material is injected and then images are taken.  How is this treated? Treatment for this condition depends on the severity of the condition. In less severe cases, treatment may include:  Rest. This may be done with a sling that holds the  shoulder still (immobilization). Your health care provider may also recommend avoiding activities that involve lifting your arm over your head.  Icing the shoulder.  Anti-inflammatory medicines, such as aspirin or ibuprofen.  In more severe cases, treatment may include:  Physical therapy.  Steroid injections.  Surgery.  Follow these instructions at home: If you have a sling:  Wear the sling as told by your health care provider. Remove it only as told by your health care provider.  Loosen the sling if your fingers tingle, become numb, or turn cold and blue.  Keep the sling clean.  If the sling is not waterproof, do not let it get wet. Remove it, if allowed, or cover it with a watertight covering when you take a bath or shower. Managing pain, stiffness, and swelling  If directed, put ice on the injured area. ? If you have a removable sling, remove it as told by your health care provider. ? Put ice in a plastic bag. ? Place a towel between your skin and the bag. ? Leave the ice on for 20 minutes, 2-3 times a day.  Move your fingers often to avoid stiffness and to lessen swelling.  Raise (elevate) the injured area above the level of your heart while you are lying down.  Find a comfortable sleeping position or sleep on a recliner, if available. Driving  Do not drive or use heavy machinery while taking prescription pain medicine.  Ask your health care provider when it is safe to drive if  you have a sling on your arm. Activity  Rest your shoulder as told by your health care provider.  Return to your normal activities as told by your health care provider. Ask your health care provider what activities are safe for you.  Do any exercises or stretches as told by your health care provider.  If you do repetitive overhead tasks, take small breaks in between and include stretching exercises as told by your health care provider. General instructions  Do not use any products that  contain nicotine or tobacco, such as cigarettes and e-cigarettes. These can delay healing. If you need help quitting, ask your health care provider.  Take over-the-counter and prescription medicines only as told by your health care provider.  Keep all follow-up visits as told by your health care provider. This is important. Contact a health care provider if:  Your pain gets worse.  You have new pain in your arm, hands, or fingers.  Your pain is not relieved with medicine or does not get better after 6 weeks of treatment.  You have cracking sensations when moving your shoulder in certain directions.  You hear a snapping sound after using your shoulder, followed by severe pain and weakness. Get help right away if:  Your arm, hand, or fingers are numb or tingling.  Your arm, hand, or fingers are swollen or painful or they turn white or blue. Summary  Rotator cuff tendinitis is inflammation of the tough, cord-like bands that connect muscle to bone (tendons) in the rotator cuff.  This condition is usually caused by overusing the rotator cuff, which includes all of the muscles and tendons that connect the arm to the shoulder.  This condition is more likely to develop in athletes and workers who frequently use their shoulder or reach over their heads.  Treatment generally includes rest, anti-inflammatory medicines, and icing. In some cases, physical therapy and steroid injections may be needed. In severe cases, surgery may be needed. This information is not intended to replace advice given to you by your health care provider. Make sure you discuss any questions you have with your health care provider. Document Released: 06/04/2003 Document Revised: 02/29/2016 Document Reviewed: 02/29/2016 Elsevier Interactive Patient Education  2017 [employer].

## 2017-09-20 ENCOUNTER — Telehealth: Payer: Self-pay

## 2017-09-20 NOTE — Telephone Encounter (Signed)
Pt left msg stating she received an injection on Monday but is still not feeling any benefit from it. Pt wanting to know when she should start feeling better and when she would know if it was not going to give any relief   Please advise

## 2017-09-21 ENCOUNTER — Ambulatory Visit: Payer: BLUE CROSS/BLUE SHIELD | Admitting: Rehabilitative and Restorative Service Providers"

## 2017-09-21 ENCOUNTER — Encounter: Payer: Self-pay | Admitting: Rehabilitative and Restorative Service Providers"

## 2017-09-21 ENCOUNTER — Ambulatory Visit (INDEPENDENT_AMBULATORY_CARE_PROVIDER_SITE_OTHER): Payer: BLUE CROSS/BLUE SHIELD | Admitting: Rehabilitative and Restorative Service Providers"

## 2017-09-21 DIAGNOSIS — R293 Abnormal posture: Secondary | ICD-10-CM | POA: Diagnosis not present

## 2017-09-21 DIAGNOSIS — R29898 Other symptoms and signs involving the musculoskeletal system: Secondary | ICD-10-CM

## 2017-09-21 DIAGNOSIS — M25511 Pain in right shoulder: Secondary | ICD-10-CM | POA: Diagnosis not present

## 2017-09-21 NOTE — Therapy (Signed)
[place], [place], [address] Phone: [phone]   Fax:  [phone]  Physical Therapy Evaluation  Patient Details  Name: Jacqueline Griffin MRN: [id number] Date of Birth: 1975-12-15 Referring Provider: Dr Lynne Leader    Encounter Date: 09/21/2017  PT End of Session - 09/21/17 1613    Visit Number  1    Number of Visits  12    Date for PT Re-Evaluation  11/02/17    PT Start Time  6644    PT Stop Time  0347    PT Time Calculation (min)  62 min    Activity Tolerance  Patient tolerated treatment well       Past Medical History:  Diagnosis Date  . Hypertension 09/18/2017  . Morbid obesity ([place]) 04/01/2012  . NASH (nonalcoholic steatohepatitis) 11/28/2012  . OSA on CPAP 04/29/2016  . Paroxysmal supraventricular tachycardia ([place]) 04/01/2009   Overview:   . Supraventricular tachycardia ([place]) 1997  . Thyroid nodule 11/07/2014  . Uterine fibroid     Past Surgical History:  Procedure Laterality Date  . CESAREAN SECTION    . HIP SURGERY    . MANDIBLE RECONSTRUCTION  42 yo    There were no vitals filed for this visit.   Subjective Assessment - 09/21/17 1618    Subjective  Patient reports that she had sudden onset of Rt shoudler pain 09/16/17 after lifting boxes/totes of books etc in closet. She was seen in urgent care and treated with OTC meds and some exercises. Symptoms continued to increase. She was seen by Dr Georgina Snell 09/18/17 and received injection with some improvement.    Pertinent History  denies any medical or musculoskeletal problems     How long can you sit comfortably?  30 min     How long can you stand comfortably?  1 hour     How long can you walk comfortably?  1 hour     Diagnostic tests  xrays     Patient Stated Goals  get rid of Rt shoulder     Currently in Pain?  Yes    Pain Score  7     Pain Location  Shoulder    Pain Orientation  Right    Pain Descriptors / Indicators   Throbbing;Nagging;Aching;Radiating;Pounding    Pain Type  Acute pain    Pain Radiating Towards  down to Rt elbow     Pain Onset  In the past 7 days    Pain Frequency  Intermittent    Aggravating Factors   lying down; trying to sleep; ice; reaching     Pain Relieving Factors  motrin          [facility] PT Assessment - 09/21/17 0001      Assessment   Medical Diagnosis  Rt rotator cuff tendonitis     Referring Provider  Dr Lynne Leader     Onset Date/Surgical Date  09/16/17    Hand Dominance  Right    Next MD Visit  10/02/17    Prior Therapy  none       Precautions   Precautions  None      Balance Screen   Has the patient fallen in the past 6 months  No    Has the patient had a decrease in activity level because of a fear of falling?   No    Is the patient reluctant to leave their home because of a fear of falling?   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 - lifting up to 5-7 pounds     Leisure  household chores; 36 yr old son       Observation/Other Assessments   Focus on Therapeutic Outcomes (FOTO)   66% limitation       Sensation   Additional Comments  WFL's per pt report       Posture/Postural Control   Posture Comments  head forward; shoulders rounded and elevated; head of the humerus anterior in orientation; scapulae abducted and rotated along the thoracic wall       AROM   Right Shoulder Extension  50 Degrees    Right Shoulder Flexion  28 Degrees patterns of substitution Rt shoulder girdle    Right Shoulder ABduction  38 Degrees patterns of substition Rt shoulder girdle    Right Shoulder Internal Rotation  -- pain, unable to access    Right Shoulder External Rotation  -- pain, unable to access    Left Shoulder Extension  67 Degrees    Left Shoulder Flexion  145 Degrees    Left Shoulder  ABduction  166 Degrees    Left Shoulder Internal Rotation  32 Degrees    Left Shoulder External Rotation  92 Degrees      Strength   Overall Strength Comments  -- Rt UE strength not tested due to pain & limited ROM      Palpation   Palpation comment  significant muscular tightness Rt cervical and upper trap musculature; periscapular musculature; pecs; anterior shoulder; biceps                 Objective measurements completed on examination: See above findings.      [facility] Adult PT Treatment/Exercise - 09/21/17 0001      Neuro Re-ed    Neuro Re-ed Details   postural correction engaging posterior shoulder girdle musculature       Shoulder Exercises: Standing   Other Standing Exercises  axial extensoin 10 sec x 5; lateral cervical flexion stretch 10 sec x 3; scap squeeze 10 sed x 10       Shoulder Exercises: Stretch   External Rotation Stretch  3 reps;10 seconds standing with cane     Table Stretch - Flexion  5 reps;10 seconds hands resting on table pt stepping back       Moist Heat Therapy   Number Minutes Moist Heat  20 Minutes    Moist Heat Location  Shoulder;Cervical      Electrical Stimulation   Electrical Stimulation Location  Rt shoulder girdle     Electrical Stimulation Action  IFC    Electrical Stimulation Parameters  to tolerance    Electrical Stimulation Goals  Pain;Tone             PT Education - 09/21/17 1710    Education Details  HEP TENS    Person(s) Educated  Patient    Methods  Explanation;Demonstration;Tactile cues;Verbal cues;Handout    Comprehension  Verbalized understanding;Returned demonstration;Verbal cues required;Tactile cues required          PT Long Term Goals - 09/21/17 1730      PT LONG TERM GOAL #1   Title  Improve posture and alignment with patient to den=monstrate upright posture with posterior shoulder girdle engaged 11/02/17  Time  6    Period  Weeks    Status  New      PT LONG TERM GOAL #2   Title  Increase AROM  Rt shoulder with good shoulder girdle control and movement patterns and decreased pain 11/02/17    Time  6    Period  Weeks    Status  New      PT LONG TERM GOAL #3   Title  Patient to report decrease in pain to 0/10 to 2/10 with all functional activities 11/02/17    Time  6    Period  Weeks    Status  New      PT LONG TERM GOAL #4   Title  Independent in HEP 11/02/17    Time  6    Period  Weeks    Status  New      PT LONG TERM GOAL #5   Title  Improve FOTO to </= 35% limitation 11/02/17    Time  6    Period  Weeks    Status  New             Plan - 09/21/17 1714    Clinical Impression Statement  Jacqueline Griffin presents with 6 day history of Rt shoulder pain. She was diagnosed with rotator cuff tendonitis and received an injection 09/18/17 with some improvement. Patient presents with poor posture and alignment; limited Rt shoulder AROM and mobility; muscular guarding; abnormal movement patterns; muscular tightness and tenderness to palpation; pain wiht active motion; limited functional activities/ADL's     Clinical Presentation  Stable    Clinical Decision Making  Low    Rehab Potential  Good    PT Frequency  2x / week    PT Duration  6 weeks    PT Treatment/Interventions  Patient/family education;ADLs/Self Care Home Management;Cryotherapy;Electrical Stimulation;Iontophoresis 4mg /ml Dexamethasone;Moist Heat;Ultrasound;Dry needling;Manual techniques;Neuromuscular re-education;Therapeutic activities;Therapeutic exercise    PT Next Visit Plan  review HEP; try to stretch pecs; trial of pulleys; postural correction; maunal work Rt shoulder girdle; PROM; modalities as indicated     Consulted and Agree with Plan of Care  Patient       Patient will benefit from skilled therapeutic intervention in order to improve the following deficits and impairments:  Postural dysfunction, Improper body mechanics, Increased fascial restricitons, Increased muscle spasms, Decreased mobility, Decreased range of  motion, Decreased activity tolerance, Pain  Visit Diagnosis: Acute pain of right shoulder - Plan: PT plan of care cert/re-cert  Other symptoms and signs involving the musculoskeletal system - Plan: PT plan of care cert/re-cert  Abnormal posture - Plan: PT plan of care cert/re-cert     Problem List Patient Active Problem List   Diagnosis Date Noted  . Hearing loss 09/18/2017  . Heart burn 09/18/2017  . Hypertension 09/18/2017  . OSA on CPAP 04/29/2016  . Thyroid nodule 11/07/2014  . NASH (nonalcoholic steatohepatitis) 11/28/2012  . Morbid obesity ([place]) 04/01/2012  . Menorrhagia 09/25/2010  . Paroxysmal supraventricular tachycardia ([place]) 04/01/2009    Macario Shear Nilda Simmer PT, MPH  09/21/2017, 5:35 PM  [facility] [place], [place], [address] Phone: [phone]   Fax:  [phone]  Name: Jacqueline Griffin MRN: [id number] Date of Birth: 10/25/1975

## 2017-09-21 NOTE — Patient Instructions (Addendum)
Axial Extension (Chin Tuck)    Pull chin in and lengthen back of neck. Hold __5__ seconds while counting out loud. Repeat __10__ times. Do __several__ sessions per day.  Side Bend, Sitting    Sit, head in comfortable, centered position, chin slightly tucked. Gently tilt head, bringing ear toward same-side shoulder. Hold _10-15__ seconds.  Repeat __2-3_ times per session. Do _2-3__ sessions per day.  Shoulder Blade Squeeze   Can use swim noodle along spine to help with posture  Rotate shoulders back, then squeeze shoulder blades down and back. Hold 10 sec Repeat _10___ times. Do _several ___ sessions per day.  Upper Back Strength: Lower Trapezius / Rotator Cuff " L's "     Arms in waitress pose, palms up. Press hands back and slide shoulder blades down. Use cane or umbrella to help right hand come out to the side  Hold for __5__ seconds. Repeat _10___ times. 2-3 times per day.   Pendulum Circular   Just lean forward and allow arm to hang toward floor - can start with just hanging arm down the begin to move body to move arm  Bend forward 90 at waist, leaning on table for support. Rock body in a circular pattern to move arm clockwise __20__ times then counterclockwise __20__ times. Do _2-3___ sessions per day. .  Flexors Stretch, Standing    Stand about a thigh's length from table. Grip edges of table. Bend knees until stretch is felt under shoulder blades in chest. Hold __5-10_ seconds. Repeat __5_ times per session. Do _3-4__ sessions per day.  TENS UNIT: This is helpful for muscle pain and spasm.   Search and Purchase a TENS 7000 2nd edition at www.tenspros.com. It should be less than $30.     TENS unit instructions: Do not shower or bathe with the unit on Turn the unit off before removing electrodes or batteries If the electrodes lose stickiness add a drop of water to the electrodes after they are disconnected from the unit and place on plastic sheet. If you  continued to have difficulty, call the TENS unit company to purchase more electrodes. Do not apply lotion on the skin area prior to use. Make sure the skin is clean and dry as this will help prolong the life of the electrodes. After use, always check skin for unusual red areas, rash or other skin difficulties. If there are any skin problems, does not apply electrodes to the same area. Never remove the electrodes from the unit by pulling the wires. Do not use the TENS unit or electrodes other than as directed. Do not change electrode placement without consultating your therapist or physician. Keep 2 fingers with between each electrode.   Can order electrodes when you order the TENS unit if you want     [facility] [place] [employer] [place] [address] [place], [place] [address]  [phone] (office) [phone] (fax)

## 2017-09-21 NOTE — Telephone Encounter (Signed)
Pt advised. Verbalized understanding. No further questions.  

## 2017-09-21 NOTE — Telephone Encounter (Signed)
3 days is not out of the normal range for how long it can take these shots to work. However if you still do not have any benefit by Monday we should re-address the pain and the plan.

## 2017-09-25 DIAGNOSIS — F411 Generalized anxiety disorder: Secondary | ICD-10-CM | POA: Diagnosis not present

## 2017-09-29 ENCOUNTER — Ambulatory Visit (INDEPENDENT_AMBULATORY_CARE_PROVIDER_SITE_OTHER): Payer: BLUE CROSS/BLUE SHIELD | Admitting: Rehabilitative and Restorative Service Providers"

## 2017-09-29 ENCOUNTER — Encounter: Payer: Self-pay | Admitting: Rehabilitative and Restorative Service Providers"

## 2017-09-29 DIAGNOSIS — R29898 Other symptoms and signs involving the musculoskeletal system: Secondary | ICD-10-CM | POA: Diagnosis not present

## 2017-09-29 DIAGNOSIS — R293 Abnormal posture: Secondary | ICD-10-CM

## 2017-09-29 DIAGNOSIS — M25511 Pain in right shoulder: Secondary | ICD-10-CM

## 2017-09-29 NOTE — Patient Instructions (Addendum)
Scapula Adduction With Pectoralis Stretch: Low - Standing   Shoulders at 45 hands even with shoulders, keeping weight through legs, shift weight forward until you feel pull or stretch through the front of your chest. Hold _30__ seconds. Do _3__ times, _2-4__ times per day.   Scapula Adduction With Pectoralis Stretch: Mid-Range - Standing   Shoulders at 90 elbows even with shoulders, keeping weight through legs, shift weight forward until you feel pull or strength through the front of your chest. Hold __30_ seconds. Do _3__ times, __2-4_ times per day.   Scapula Adduction With Pectoralis Stretch: High - Standing   Shoulders at 120 hands up high on the doorway, keeping weight on feet, shift weight forward until you feel pull or stretch through the front of your chest. Hold _30__ seconds. Do _3__ times, _2-3__ times per day.   SUPINE Tips A    Being in the supine position means to be lying on the back. Lying on the back is the position of least compression on the bones and discs of the spine, and helps to re-align the natural curves of the back. Work toward 3-5 min

## 2017-09-29 NOTE — Therapy (Signed)
[place], [place], [address] Phone: [phone]   Fax:  [phone]  Physical Therapy Treatment  Patient Details  Name: Jacqueline Griffin MRN: [id number] Date of Birth: 06-07-1975 Referring Provider: Dr Lynne Leader    Encounter Date: 09/29/2017  PT End of Session - 09/29/17 0810    Visit Number  2    Number of Visits  12    Date for PT Re-Evaluation  11/02/17    PT Start Time  0802    PT Stop Time  0903    PT Time Calculation (min)  61 min    Activity Tolerance  Patient tolerated treatment well       Past Medical History:  Diagnosis Date  . Hypertension 09/18/2017  . Morbid obesity ([place]) 04/01/2012  . NASH (nonalcoholic steatohepatitis) 11/28/2012  . OSA on CPAP 04/29/2016  . Paroxysmal supraventricular tachycardia ([place]) 04/01/2009   Overview:   . Supraventricular tachycardia ([place]) 1997  . Thyroid nodule 11/07/2014  . Uterine fibroid     Past Surgical History:  Procedure Laterality Date  . CESAREAN SECTION    . HIP SURGERY    . MANDIBLE RECONSTRUCTION  42 yo    There were no vitals filed for this visit.  Subjective Assessment - 09/29/17 0811    Subjective  Patient reports that her shoulder is feeling better. She still feels some soreness in the front of the shoulder. It is not completely better yet.     Currently in Pain?  No/denies    Pain Location  Shoulder    Pain Orientation  Right    Pain Descriptors / Indicators  Tightness;Aching    Pain Type  Acute pain    Pain Onset  In the past 7 days    Pain Frequency  Intermittent                       [facility] Adult PT Treatment/Exercise - 09/29/17 0001      Shoulder Exercises: Standing   Other Standing Exercises  axial extensoin 10 sec x 5; lateral cervical flexion stretch 10 sec x 3; scap squeeze 10 sed x 10     Other Standing Exercises  biceps stretch 30 x 2 reps       Shoulder Exercises: Pulleys   Flexion  -- 10 sec x 5        Shoulder Exercises: ROM/Strengthening   UBE (Upper Arm Bike)  pain with fwd and backwards trial       Shoulder Exercises: Stretch   External Rotation Stretch  3 reps;10 seconds standing with cane     Table Stretch - Flexion  10 seconds;3 reps hands resting on table pt stepping back     Other Shoulder Stretches  3 way doorway stretch 30 sec x 2 reps       Moist Heat Therapy   Number Minutes Moist Heat  30 Minutes    Moist Heat Location  Shoulder;Cervical      Electrical Stimulation   Electrical Stimulation Location  Rt shoulder girdle     Electrical Stimulation Action  IFC    Electrical Stimulation Parameters  to tolerance     Electrical Stimulation Goals  Pain;Tone      Manual Therapy   Manual therapy comments  pt supine     Joint Mobilization  GH circumduction     Myofascial Release  pecs/anterior shoulder     Scapular Mobilization  lateral mobs  Passive ROM  Rt shoulder              PT Education - 09/29/17 0848    Education Details  HEP     Person(s) Educated  Patient    Methods  Explanation;Demonstration;Tactile cues;Verbal cues;Handout    Comprehension  Verbalized understanding;Returned demonstration;Verbal cues required;Tactile cues required          PT Long Term Goals - 09/29/17 0810      PT LONG TERM GOAL #1   Title  Improve posture and alignment with patient to den=monstrate upright posture with posterior shoulder girdle engaged 11/02/17    Time  6    Period  Weeks    Status  On-going      PT LONG TERM GOAL #2   Title  Increase AROM Rt shoulder with good shoulder girdle control and movement patterns and decreased pain 11/02/17    Time  6    Period  Weeks    Status  On-going      PT LONG TERM GOAL #3   Title  Patient to report decrease in pain to 0/10 to 2/10 with all functional activities 11/02/17    Time  6    Period  Weeks    Status  On-going      PT LONG TERM GOAL #4   Title  Independent in HEP 11/02/17    Time  6    Period  Weeks     Status  On-going      PT LONG TERM GOAL #5   Title  Improve FOTO to </= 35% limitation 11/02/17    Time  6    Period  Weeks    Status  On-going            Plan - 09/29/17 3220    Clinical Impression Statement  Patient reports improving pain and function in the Rt shoulder. She added some stretches today. Tolerated soft wissue work well. Continued tightness through anterior shoulder - pecs/biceps/anterior deltoid. Progressing gradually toward stated goals of therapy.     Rehab Potential  Good    PT Frequency  2x / week    PT Duration  6 weeks    PT Treatment/Interventions  Patient/family education;ADLs/Self Care Home Management;Cryotherapy;Electrical Stimulation;Iontophoresis 4mg /ml Dexamethasone;Moist Heat;Ultrasound;Dry needling;Manual techniques;Neuromuscular re-education;Therapeutic activities;Therapeutic exercise    PT Next Visit Plan  review HEP; review on stretch pecs;  postural correction; assess response to maunal work Rt shoulder girdle; PROM; modalities as indicated     Consulted and Agree with Plan of Care  Patient       Patient will benefit from skilled therapeutic intervention in order to improve the following deficits and impairments:  Postural dysfunction, Improper body mechanics, Increased fascial restricitons, Increased muscle spasms, Decreased mobility, Decreased range of motion, Decreased activity tolerance, Pain  Visit Diagnosis: Acute pain of right shoulder  Other symptoms and signs involving the musculoskeletal system  Abnormal posture     Problem List Patient Active Problem List   Diagnosis Date Noted  . Hearing loss 09/18/2017  . Heart burn 09/18/2017  . Hypertension 09/18/2017  . OSA on CPAP 04/29/2016  . Thyroid nodule 11/07/2014  . NASH (nonalcoholic steatohepatitis) 11/28/2012  . Morbid obesity ([place]) 04/01/2012  . Menorrhagia 09/25/2010  . Paroxysmal supraventricular tachycardia ([place]) 04/01/2009    Izadora Roehr Nilda Simmer PT, MPH  09/29/2017, 8:49  AM  [facility] [place], [place], [address] Phone: [phone]   Fax:  [phone]  Name: Jacqueline Griffin MRN: [id number] Date of Birth: 08-24-1975

## 2017-10-02 ENCOUNTER — Ambulatory Visit: Payer: BLUE CROSS/BLUE SHIELD | Admitting: Family Medicine

## 2017-10-02 ENCOUNTER — Encounter: Payer: BLUE CROSS/BLUE SHIELD | Admitting: Physical Therapy

## 2017-10-03 ENCOUNTER — Ambulatory Visit (INDEPENDENT_AMBULATORY_CARE_PROVIDER_SITE_OTHER): Payer: BLUE CROSS/BLUE SHIELD | Admitting: Family Medicine

## 2017-10-03 ENCOUNTER — Encounter: Payer: Self-pay | Admitting: Family Medicine

## 2017-10-03 VITALS — BP 127/55 | HR 83 | Ht 66.0 in | Wt 251.0 lb

## 2017-10-03 DIAGNOSIS — M7581 Other shoulder lesions, right shoulder: Secondary | ICD-10-CM

## 2017-10-03 NOTE — Progress Notes (Signed)
Jacqueline Griffin is a 42 y.o. female who presents to [facility] today for right shoulder pain.  Is here to follow-up shoulder pain.  She was seen on June 22 in urgent care and with me on June 24.  She was thought to have rotator cuff tendinitis of the supraspinatus tendon.  X-rays showed calcific tendinitis.  She had subacromial bursa injection on the 24th and referral to physical therapy.  She did not have immediate pain relief on injection but a few days after the injection she did have considerable pain relief.  She is been doing physical therapy and does note significant symptom improvement.  She had a bit of a setback after visit to the gym where she had her arm in an awkward position riding the recumbent bike.  She does note however her pain and swelling and stiffness and limited range of motion is improving.  She denies any radiating pain weakness or numbness distally.    ROS:  As above  Exam:  BP (!) 127/55   Pulse 83   Ht 5\' 6"  (1.676 m)   Wt 251 lb (113.9 kg)   BMI 40.51 kg/m  General: Well Developed, well nourished, and in no acute distress.  Neuro/Psych: Alert and oriented x3, extra-ocular muscles intact, able to move all 4 extremities, sensation grossly intact. Skin: Warm and dry, no rashes noted.  Respiratory: Not using accessory muscles, speaking in full sentences, trachea midline.  Cardiovascular: Pulses palpable, no extremity edema. Abdomen: Does not appear distended. MSK:  Right shoulder normal-appearing Mildly tender to palpation at the bicipital groove. Limited abduction by pain. Pain with impingement testing.      Assessment and Plan: 42 y.o. female with  Right shoulder pain continued rotator cuff tendinitis.  Improving but having setbacks.  Plan to continue formal physical therapy and use naproxen as needed.  Recheck in about 4 weeks.  At that point if not making significant improvement next step would likely be  MRI for surgical planning.  If injection planned in the future would recommend glenohumeral approach as subacromial bursa approach did not produce significant symptom relief immediately.  I spent 15 minutes with this patient, greater than 50% was face-to-face time counseling regarding ddx and plan.   No orders of the defined types were placed in this encounter.  No orders of the defined types were placed in this encounter.   Historical information moved to improve visibility of documentation.  Past Medical History:  Diagnosis Date  . Hypertension 09/18/2017  . Morbid obesity ([place]) 04/01/2012  . NASH (nonalcoholic steatohepatitis) 11/28/2012  . OSA on CPAP 04/29/2016  . Paroxysmal supraventricular tachycardia ([place]) 04/01/2009   Overview:   . Supraventricular tachycardia ([place]) 1997  . Thyroid nodule 11/07/2014  . Uterine fibroid    Past Surgical History:  Procedure Laterality Date  . CESAREAN SECTION    . HIP SURGERY    . MANDIBLE RECONSTRUCTION  42 yo   Social History   Tobacco Use  . Smoking status: Never Smoker  . Smokeless tobacco: Never Used  Substance Use Topics  . Alcohol use: No   family history includes Hypertension in her father; Leukemia in her mother.  Medications: Current Outpatient Medications  Medication Sig Dispense Refill  . clarithromycin (BIAXIN) 250 MG/5ML suspension Take by mouth 2 (two) times daily.    . diclofenac sodium (VOLTAREN) 1 % GEL Apply 2 g topically 4 (four) times daily. To affected joint. 100 g 11  . ferrous sulfate  325 (65 FE) MG tablet Take 1 tablet (325 mg total) by mouth daily. 30 tablet 5  . ibuprofen (ADVIL,MOTRIN) 600 MG tablet Take 1 tablet (600 mg total) by mouth every 6 (six) hours as needed. 30 tablet 0  . metoprolol (LOPRESSOR) 50 MG tablet Take 50 mg by mouth 2 (two) times daily.      . metoprolol succinate (TOPROL-XL) 100 MG 24 hr tablet Take 100 mg by mouth daily. Take with or immediately following a meal.     No current  facility-administered medications for this visit.    Allergies  Allergen Reactions  . Shrimp [Shellfish Allergy] Anaphylaxis  . Amoxicillin   . Clindamycin/Lincomycin     Palpitations       Discussed warning signs or symptoms. Please see discharge instructions. Patient expresses understanding.

## 2017-10-03 NOTE — Patient Instructions (Signed)
Thank you for coming in today. Continue PT.  Continue ibuprofen as needed.  Continue home exercises.  Recheck in 4 weeks.  Let me know if you are having setbacks or worsening.

## 2017-10-05 ENCOUNTER — Ambulatory Visit (INDEPENDENT_AMBULATORY_CARE_PROVIDER_SITE_OTHER): Payer: BLUE CROSS/BLUE SHIELD | Admitting: Physical Therapy

## 2017-10-05 DIAGNOSIS — R293 Abnormal posture: Secondary | ICD-10-CM | POA: Diagnosis not present

## 2017-10-05 DIAGNOSIS — M25511 Pain in right shoulder: Secondary | ICD-10-CM

## 2017-10-05 DIAGNOSIS — R29898 Other symptoms and signs involving the musculoskeletal system: Secondary | ICD-10-CM

## 2017-10-05 NOTE — Therapy (Signed)
[place], [place], [address] Phone: [phone]   Fax:  [phone]  Physical Therapy Treatment  Patient Details  Name: Jacqueline Griffin MRN: [id number] Date of Birth: [birth year] Referring Provider: Dr. Lynne Leader   Encounter Date: 10/05/2017  PT End of Session - 10/05/17 1709    Visit Number  3    Number of Visits  12    Date for PT Re-Evaluation  11/02/17    PT Start Time  5329    PT Stop Time  1752    PT Time Calculation (min)  47 min    Activity Tolerance  Patient tolerated treatment well    Behavior During Therapy  [facility] for tasks assessed/performed       Past Medical History:  Diagnosis Date  . Hypertension 09/18/2017  . Morbid obesity ([place]) 04/01/2012  . NASH (nonalcoholic steatohepatitis) 11/28/2012  . OSA on CPAP 04/29/2016  . Paroxysmal supraventricular tachycardia ([place]) 04/01/2009   Overview:   . Supraventricular tachycardia ([place]) 1997  . Thyroid nodule 11/07/2014  . Uterine fibroid     Past Surgical History:  Procedure Laterality Date  . CESAREAN SECTION    . HIP SURGERY    . MANDIBLE RECONSTRUCTION  42 yo    There were no vitals filed for this visit.  Subjective Assessment - 10/05/17 1711    Subjective  "Today is probably about the best it has felt in a while".  she reports she can lift her arm a little higher with less pain.      Currently in Pain?  Yes    Pain Score  2     Pain Location  Shoulder    Pain Orientation  Right    Pain Descriptors / Indicators  Dull    Aggravating Factors   lying down, ice, reaching    Pain Relieving Factors  motrin         [facility] PT Assessment - 10/05/17 0001      Assessment   Medical Diagnosis  Rt rotator cuff tendonitis     Referring Provider  Dr. Lynne Leader    Onset Date/Surgical Date  09/16/17    Hand Dominance  Right    Next MD Visit  11/06/17      AROM   Overall AROM Comments  AAROM flexion RUE to 140 deg in supine   AROM  Assessment Site  -- measured in standing    Right Shoulder Extension  50 Degrees    Right Shoulder Flexion  67 Degrees    Right Shoulder ABduction  125 Degrees    Right Shoulder External Rotation  -- supine, 90 deg         [facility] Adult PT Treatment/Exercise - 10/05/17 0001      Shoulder Exercises: Supine   Flexion  AAROM;12 reps dowel      Shoulder Exercises: Standing   Internal Rotation  AAROM;Right;10 reps assist from [place];Both;10 reps dowel    Other Standing Exercises  shoulder rolls x 10       Shoulder Exercises: ROM/Strengthening   Nustep  L5: arms/legs to tolerance x 5 min       Shoulder Exercises: Isometric Strengthening   Flexion  5X5"      Shoulder Exercises: Stretch   Other Shoulder Stretches  3 way doorway stretch 15 sec x 3 reps     Other Shoulder Stretches  bicep stretch x 10 sec x 4 reps  Moist Heat Therapy   Number Minutes Moist Heat  15 Minutes    Moist Heat Location  Shoulder Rt      Electrical Stimulation   Electrical Stimulation Location  Rt shoulder girdle     Electrical Stimulation Action  IFC    Electrical Stimulation Parameters  to tolerance     Electrical Stimulation Goals  Pain                  PT Long Term Goals - 09/29/17 0810      PT LONG TERM GOAL #1   Title  Improve posture and alignment with patient to den=monstrate upright posture with posterior shoulder girdle engaged 11/02/17    Time  6    Period  Weeks    Status  On-going      PT LONG TERM GOAL #2   Title  Increase AROM Rt shoulder with good shoulder girdle control and movement patterns and decreased pain 11/02/17    Time  6    Period  Weeks    Status  On-going      PT LONG TERM GOAL #3   Title  Patient to report decrease in pain to 0/10 to 2/10 with all functional activities 11/02/17    Time  6    Period  Weeks    Status  On-going      PT LONG TERM GOAL #4   Title  Independent in HEP 11/02/17    Time  6    Period  Weeks    Status  On-going       PT LONG TERM GOAL #5   Title  Improve FOTO to </= 35% limitation 11/02/17    Time  6    Period  Weeks    Status  On-going            Plan - 10/05/17 1719    Clinical Impression Statement  Pt reporting less shoulder pain with work and around the home.  Pt unable to tolerate high level position doorway stretch; modified HEP to low and mid-level for now. Pt demonstrated improved Rt shoulder AROM compared to eval.  Progressing towards goals.      Rehab Potential  Good    PT Frequency  2x / week    PT Duration  6 weeks    PT Treatment/Interventions  Patient/family education;ADLs/Self Care Home Management;Cryotherapy;Electrical Stimulation;Iontophoresis 4mg /ml Dexamethasone;Moist Heat;Ultrasound;Dry needling;Manual techniques;Neuromuscular re-education;Therapeutic activities;Therapeutic exercise    PT Next Visit Plan  continue progressive Rt shoulder ROM, gentle strengthening, manual work/PROM.     Consulted and Agree with Plan of Care  Patient       Patient will benefit from skilled therapeutic intervention in order to improve the following deficits and impairments:  Postural dysfunction, Improper body mechanics, Increased fascial restricitons, Increased muscle spasms, Decreased mobility, Decreased range of motion, Decreased activity tolerance, Pain  Visit Diagnosis: Acute pain of right shoulder  Other symptoms and signs involving the musculoskeletal system  Abnormal posture     Problem List Patient Active Problem List   Diagnosis Date Noted  . Hearing loss 09/18/2017  . Heart burn 09/18/2017  . Hypertension 09/18/2017  . OSA on CPAP 04/29/2016  . Thyroid nodule 11/07/2014  . NASH (nonalcoholic steatohepatitis) 11/28/2012  . Morbid obesity ([place]) 04/01/2012  . Menorrhagia 09/25/2010  . Paroxysmal supraventricular tachycardia ([place]) 04/01/2009   Jacqueline Griffin, PTA 10/05/17 5:49 PM  [place]  [place], [place], [address] Phone: [phone]  Fax:  [phone]  Name: Jacqueline Griffin MRN: [id number] Date of Birth: 1975/08/28

## 2017-10-05 NOTE — Patient Instructions (Signed)
Cane Exercise: Flexion    Lie on back, holding cane above chest. Keeping arms as straight as possible, lower cane toward floor beyond head. Hold _5___ seconds. Repeat _10___ times. Do __1__ sessions per day.   Cane Exercise: Extension    Stand holding cane behind back with both hands palm-up. Lift the cane away from body. Hold _3-5___ seconds. Repeat _10___ times. Do _1-2___ sessions per day.  Strengthening: Isometric Flexion  Using wall for resistance, press right fist into ball using light pressure. Hold __5__ seconds.  - DON'T LEAN INTO WALL.  Repeat __10__ times per set. Do __1__ sets per session. Do _1-2___ sessions per day.

## 2017-10-09 ENCOUNTER — Ambulatory Visit (INDEPENDENT_AMBULATORY_CARE_PROVIDER_SITE_OTHER): Payer: BLUE CROSS/BLUE SHIELD | Admitting: Physical Therapy

## 2017-10-09 DIAGNOSIS — M25511 Pain in right shoulder: Secondary | ICD-10-CM

## 2017-10-09 DIAGNOSIS — F411 Generalized anxiety disorder: Secondary | ICD-10-CM | POA: Diagnosis not present

## 2017-10-09 DIAGNOSIS — R29898 Other symptoms and signs involving the musculoskeletal system: Secondary | ICD-10-CM

## 2017-10-09 DIAGNOSIS — R293 Abnormal posture: Secondary | ICD-10-CM

## 2017-10-09 NOTE — Therapy (Signed)
[place], [place], [address] Phone: [phone]   Fax:  [phone]  Physical Therapy Treatment  Patient Details  Name: Jacqueline Griffin MRN: [id number] Date of Birth: 1976-02-21 Referring Provider: Dr. Lynne Leader   Encounter Date: 10/09/2017  PT End of Session - 10/09/17 1607    Visit Number  4    Number of Visits  12    Date for PT Re-Evaluation  11/02/17    PT Start Time  2505    PT Stop Time  3976 MHP last 12 min     PT Time Calculation (min)  51 min    Activity Tolerance  Patient tolerated treatment well;No increased pain    Behavior During Therapy  WFL for tasks assessed/performed       Past Medical History:  Diagnosis Date  . Hypertension 09/18/2017  . Morbid obesity ([place]) 04/01/2012  . NASH (nonalcoholic steatohepatitis) 11/28/2012  . OSA on CPAP 04/29/2016  . Paroxysmal supraventricular tachycardia ([place]) 04/01/2009   Overview:   . Supraventricular tachycardia ([place]) 1997  . Thyroid nodule 11/07/2014  . Uterine fibroid     Past Surgical History:  Procedure Laterality Date  . CESAREAN SECTION    . HIP SURGERY    . MANDIBLE RECONSTRUCTION  42 yo    There were no vitals filed for this visit.  Subjective Assessment - 10/09/17 1607    Subjective  Pt reports she still unable to sleep on her Rt shoulder.  She reports she has been sleeping on her back with pillows under her arm, and this has helped.  She notes that she is able to reach her arm a little better than last week.      Patient Stated Goals  get rid of Rt shoulder     Currently in Pain?  No/denies    Pain Score  0-No pain         [facility] PT Assessment - 10/09/17 0001      Assessment   Medical Diagnosis  Rt rotator cuff tendonitis     Referring Provider  Dr. Lynne Leader    Onset Date/Surgical Date  09/16/17    Hand Dominance  Right    Next MD Visit  11/06/17      AROM   Right Shoulder Flexion  128 Degrees    Right Shoulder  ABduction  144 Degrees    Right Shoulder Internal Rotation  -- thumb to T8       [facility] Adult PT Treatment/Exercise - 10/09/17 0001      Self-Care   Self-Care  Other Self-Care Comments    Other Self-Care Comments   pt educated on self massage with ball to Rt shoulder girdle including pec.  Pt verbalized understanding and returned demo with cues.       Shoulder Exercises: Standing   External Rotation  Strengthening;Right;10 reps;Theraband    Theraband Level (Shoulder External Rotation)  Level 1 (Yellow)    Flexion  Strengthening;Right;10 reps;Theraband [place] 4    Theraband Level (Shoulder Flexion)  Level 1 (Yellow)    Extension  AAROM;Both;10 reps cane    Row  Strengthening;Right;10 reps;Theraband    Theraband Level (Shoulder Row)  Level 1 (Yellow)    Other Standing Exercises  wall ladder, Rt shoulder flexion up to #26 x 5 reps    Other Standing Exercises  wall washing with Rt hand, flexion x 10      Shoulder Exercises: Stretch   Internal Rotation Stretch  --  10 reps, 3 sec hold.     Other Shoulder Stretches  2 position doorway stretch x 15 sec x 3 reps each position.       Modalities   Modalities  --      Moist Heat Therapy   Number Minutes Moist Heat  12 Minutes    Moist Heat Location  Shoulder Rt, and thoracic area             PT Education - 10/09/17 1627    Education Details  HEP    Person(s) Educated  Patient    Methods  Explanation;Handout;Verbal cues;Tactile cues;Demonstration    Comprehension  Verbalized understanding;Returned demonstration          PT Long Term Goals - 09/29/17 0810      PT LONG TERM GOAL #1   Title  Improve posture and alignment with patient to den=monstrate upright posture with posterior shoulder girdle engaged 11/02/17    Time  6    Period  Weeks    Status  On-going      PT LONG TERM GOAL #2   Title  Increase AROM Rt shoulder with good shoulder girdle control and movement patterns and decreased pain 11/02/17    Time  6    Period   Weeks    Status  On-going      PT LONG TERM GOAL #3   Title  Patient to report decrease in pain to 0/10 to 2/10 with all functional activities 11/02/17    Time  6    Period  Weeks    Status  On-going      PT LONG TERM GOAL #4   Title  Independent in HEP 11/02/17    Time  6    Period  Weeks    Status  On-going      PT LONG TERM GOAL #5   Title  Improve FOTO to </= 35% limitation 11/02/17    Time  6    Period  Weeks    Status  On-going            Plan - 10/09/17 1651    Clinical Impression Statement  Pt demonstrated improved Rt shoulder ROM.  She tolerated new exercises without increase in pain.  Pt making good gains towards established therapy goals.     Rehab Potential  Good    PT Frequency  2x / week    PT Duration  6 weeks    PT Treatment/Interventions  Patient/family education;ADLs/Self Care Home Management;Cryotherapy;Electrical Stimulation;Iontophoresis 68m/ml Dexamethasone;Moist Heat;Ultrasound;Dry needling;Manual techniques;Neuromuscular re-education;Therapeutic activities;Therapeutic exercise    PT Next Visit Plan  continue progressive Rt shoulder ROM, gentle strengthening, manual work/PROM.     Consulted and Agree with Plan of Care  Patient       Patient will benefit from skilled therapeutic intervention in order to improve the following deficits and impairments:  Postural dysfunction, Improper body mechanics, Increased fascial restricitons, Increased muscle spasms, Decreased mobility, Decreased range of motion, Decreased activity tolerance, Pain  Visit Diagnosis: Acute pain of right shoulder  Other symptoms and signs involving the musculoskeletal system  Abnormal posture     Problem List Patient Active Problem List   Diagnosis Date Noted  . Hearing loss 09/18/2017  . Heart burn 09/18/2017  . Hypertension 09/18/2017  . OSA on CPAP 04/29/2016  . Thyroid nodule 11/07/2014  . NASH (nonalcoholic steatohepatitis) 11/28/2012  . [place] ([place] 04/01/2012   . Menorrhagia 09/25/2010  . Par[place]ricular tachycardia ([place] 04/01/2009   JAnderson Malta  [place], PTA 10/09/17 5:01 PM  [facility] [place], [place], [address] Phone: [phone]   Fax:  [phone]  Name: Jacqueline Griffin MRN: [id number] Date of Birth: 1975/06/23

## 2017-10-09 NOTE — Patient Instructions (Signed)
  Resisted External Rotation: in Neutral - Bilateral  PALMS UP!!! Sit or stand, tubing in both hands, elbows at sides, bent to 90, forearms forward. Pinch shoulder blades together and rotate forearms out. Keep elbows at sides. Repeat __10__ times per set. Do __2-3__ sets per session. Do __3-4__ sessions per week.  Resistive Band Rowing   With resistive band anchored in door, grasp both ends. Keeping elbows bent, pull back, squeezing shoulder blades together. Hold _3-5___ seconds. Repeat _10___ times. Do __2-3__ sessions per day.  3-4 sessions per week.    Press: Thumb Up (Single Arm)   Face away from anchor in stride stance, leg forward opposite exercising arm. Press arm forward with thumb up. Repeat 10 times per set.  Do _2-3_ sets per session. Do _3_ sessions per week.   [facility] [place], [place] [address]  [phone] (office) [phone] (fax)

## 2017-10-11 DIAGNOSIS — R002 Palpitations: Secondary | ICD-10-CM | POA: Diagnosis not present

## 2017-10-12 ENCOUNTER — Encounter: Payer: BLUE CROSS/BLUE SHIELD | Admitting: Rehabilitative and Restorative Service Providers"

## 2017-10-19 ENCOUNTER — Encounter: Payer: Self-pay | Admitting: Rehabilitative and Restorative Service Providers"

## 2017-10-19 ENCOUNTER — Ambulatory Visit (INDEPENDENT_AMBULATORY_CARE_PROVIDER_SITE_OTHER): Payer: BLUE CROSS/BLUE SHIELD | Admitting: Rehabilitative and Restorative Service Providers"

## 2017-10-19 DIAGNOSIS — M25511 Pain in right shoulder: Secondary | ICD-10-CM

## 2017-10-19 DIAGNOSIS — R293 Abnormal posture: Secondary | ICD-10-CM | POA: Diagnosis not present

## 2017-10-19 DIAGNOSIS — R29898 Other symptoms and signs involving the musculoskeletal system: Secondary | ICD-10-CM | POA: Diagnosis not present

## 2017-10-19 NOTE — Therapy (Addendum)
[place], [place], [address] Phone: [phone]   Fax:  [phone]  Physical Therapy Treatment  Patient Details  Name: Jacqueline Griffin MRN: [id number] Date of Birth: 14-Feb-1976 Referring Provider: Dr Lynne Leader    Encounter Date: 10/19/2017  PT End of Session - 10/19/17 1621    Visit Number  5    Number of Visits  12    Date for PT Re-Evaluation  11/02/17    PT Start Time  1620    PT Stop Time  1719    PT Time Calculation (min)  59 min    Activity Tolerance  Patient tolerated treatment well       Past Medical History:  Diagnosis Date  . Hypertension 09/18/2017  . Morbid obesity ([place]) 04/01/2012  . NASH (nonalcoholic steatohepatitis) 11/28/2012  . OSA on CPAP 04/29/2016  . Paroxysmal supraventricular tachycardia ([place]) 04/01/2009   Overview:   . Supraventricular tachycardia ([place]) 1997  . Thyroid nodule 11/07/2014  . Uterine fibroid     Past Surgical History:  Procedure Laterality Date  . CESAREAN SECTION    . HIP SURGERY    . MANDIBLE RECONSTRUCTION  42 yo    There were no vitals filed for this visit.  Subjective Assessment - 10/19/17 1624    Subjective  Patient reports that she continues to have some intermittent pain in the Rt shoulder - worse with reaching up and back behind her back. She still can't sleep on the Rt side     Currently in Pain?  No/denies         [facility] PT Assessment - 10/19/17 0001      Assessment   Medical Diagnosis  Rt rotator cuff tendonitis     Referring Provider  Dr Lynne Leader     Onset Date/Surgical Date  09/16/17    Hand Dominance  Right    Next MD Visit  11/06/17      AROM   Right Shoulder Flexion  135 Degrees    Right Shoulder ABduction  156 Degrees      Palpation   Palpation comment  muscular tightness through the Rt pecs/anterior shoulder; biceps; teres; upper trap; lateral/medial scapular border                   [facility] Adult PT  Treatment/Exercise - 10/19/17 0001      Therapeutic Activites    Therapeutic Activities  -- myofacial ball release work teres and posterior Rt shd       Shoulder Exercises: Standing   External Rotation  Strengthening;Right;10 reps;Theraband    Theraband Level (Shoulder External Rotation)  Level 2 (Red)    Extension  Strengthening;Right;Left;10 reps;Theraband    Theraband Level (Shoulder Extension)  Level 2 (Red)    Row  Strengthening;Right;Left;10 reps;Theraband    Theraband Level (Shoulder Row)  Level 2 (Red)      Shoulder Exercises: Pulleys   Flexion  -- 10 sec hold 5 reps    Scaption  -- 10 sec x 5    Other Pulley Exercises  internal rotation standing 10 sec x 5 reps       Shoulder Exercises: Therapy Ball   Other Therapy Ball Exercises  pressing into large green ball - scapular depression 5 sec x 10       Shoulder Exercises: Stretch   Other Shoulder Stretches  2 position doorway stretch x 15 sec x 3 reps each position.     Other Shoulder Stretches  horizontal shoulder abduction 30 sec x 3       Moist Heat Therapy   Number Minutes Moist Heat  15 Minutes    Moist Heat Location  Shoulder Rt, and thoracic area      Electrical Stimulation   Electrical Stimulation Location  Rt pecs; biceps; teres    Electrical Stimulation Action  TENS    Electrical Stimulation Parameters  to tolerance    Electrical Stimulation Goals  Tone;Pain      Iontophoresis   Type of Iontophoresis  Dexamethasone    Location  anterior Rt shoulder    Dose  80 mAmp    Time  14 hours       Manual Therapy   Manual therapy comments  pt wupine     Soft tissue mobilization  deep tissue work through [employer]; anterior shoudler; biceps; upper trap; teres     Scapular Mobilization  lateral and inferior mobs     Passive ROM  Rt shoulder              PT Education - 10/19/17 1627    Education Details  HEP ionto    Person(s) Educated  Patient    Methods  Explanation;Demonstration;Tactile cues;Verbal  cues;Handout    Comprehension  Verbalized understanding;Returned demonstration;Verbal cues required;Tactile cues required          PT Long Term Goals - 10/19/17 1623      PT LONG TERM GOAL #1   Title  Improve posture and alignment with patient to den=monstrate upright posture with posterior shoulder girdle engaged 11/02/17    Time  6    Period  Weeks    Status  On-going      PT LONG TERM GOAL #2   Title  Increase AROM Rt shoulder with good shoulder girdle control and movement patterns and decreased pain 11/02/17    Time  6    Period  Weeks    Status  On-going      PT LONG TERM GOAL #3   Title  Patient to report decrease in pain to 0/10 to 2/10 with all functional activities 11/02/17    Time  6    Period  Weeks    Status  On-going      PT LONG TERM GOAL #4   Title  Independent in HEP 11/02/17    Time  6    Period  Weeks    Status  On-going      PT LONG TERM GOAL #5   Title  Improve FOTO to </= 35% limitation 11/02/17    Time  6    Period  Weeks    Status  On-going            Plan - 10/19/17 1629    Clinical Impression Statement  Gradually improving with decreased pain and increased mobility and functional abilities. Patient has persistent tightness through pecs/anterior shoulder; biceps; teres; upper trap; lateral/medial scapular border. Responds well to manual work. Added trial of ionto to anterior shd/biceps area. Progressing well toward stated goals of therapy.     Rehab Potential  Good    PT Frequency  2x / week    PT Duration  6 weeks    PT Treatment/Interventions  Patient/family education;ADLs/Self Care Home Management;Cryotherapy;Electrical Stimulation;Iontophoresis 55m/ml Dexamethasone;Moist Heat;Ultrasound;Dry needling;Manual techniques;Neuromuscular re-education;Therapeutic activities;Therapeutic exercise    PT Next Visit Plan  continue progressive Rt shoulder ROM, gentle strengthening, manual work/PROM. Assess response to manual work and [employer]  Agree with Plan of Care  Patient       Patient will benefit from skilled therapeutic intervention in order to improve the following deficits and impairments:  Postural dysfunction, Improper body mechanics, Increased fascial restricitons, Increased muscle spasms, Decreased mobility, Decreased range of motion, Decreased activity tolerance, Pain  Visit Diagnosis: Acute pain of right shoulder  Other symptoms and signs involving the musculoskeletal system  Abnormal posture     Problem List Patient Active Problem List   Diagnosis Date Noted  . Hearing loss 09/18/2017  . Heart burn 09/18/2017  . Hypertension 09/18/2017  . OSA on CPAP 04/29/2016  . Thyroid nodule 11/07/2014  . NASH (nonalcoholic steatohepatitis) 11/28/2012  . Morbid obesity ([place]) 04/01/2012  . Menorrhagia 09/25/2010  . Paroxysmal supraventricular tachycardia ([place]) 04/01/2009    Emeril Stille Nilda Simmer  PT, MPH  10/19/2017, 5:11 PM  [facility] [place], [place], [address] Phone: [phone]   Fax:  [phone]  Name: Jacqueline Griffin MRN: [id number] Date of Birth: [birth year]  PHYSICAL THERAPY DISCHARGE SUMMARY  Visits from Start of Care: 5  Current functional level related to goals / functional outcomes: See progress note for discharge status   Remaining deficits: Unknown    Education / Equipment: HEP  Plan: Patient agrees to discharge.  Patient goals were partially met. Patient is being discharged due to being pleased with the current functional level.  ?????    Hellena Pridgen P. Helene Kelp PT, MPH 12/06/17 12:57 PM

## 2017-10-19 NOTE — Patient Instructions (Addendum)
Face wall - hug the wall  Rest right arm out along the wall and slowly turn your body to the left Hold 20-30 sec 3 reps 2-3 times a day    IONTOPHORESIS PATIENT PRECAUTIONS & CONTRAINDICATIONS:  . Redness under one or both electrodes can occur.  This characterized by a uniform redness that usually disappears within 12 hours of treatment. . Small pinhead size blisters may result in response to the drug.  Contact your physician if the problem persists more than 24 hours. . On rare occasions, iontophoresis therapy can result in temporary skin reactions such as rash, inflammation, irritation or burns.  The skin reactions may be the result of individual sensitivity to the ionic solution used, the condition of the skin at the start of treatment, reaction to the materials in the electrodes, allergies or sensitivity to dexamethasone, or a poor connection between the patch and your skin.  Discontinue using iontophoresis if you have any of these reactions and report to your therapist. . Remove the Patch or electrodes if you have any undue sensation of pain or burning during the treatment and report discomfort to your therapist. . Tell your Therapist if you have had known adverse reactions to the application of electrical current. . If using the Patch, the LED light will turn off when treatment is complete and the patch can be removed.  Approximate treatment time is 1-3 hours.  Remove the patch when light goes off or after 6 hours. . The Patch can be worn during normal activity, however excessive motion where the electrodes have been placed can cause poor contact between the skin and the electrode or uneven electrical current resulting in greater risk of skin irritation. [name] Keep out of the reach of children.   . DO NOT use if you have a cardiac pacemaker or any other electrically sensitive implanted device. . DO NOT use if you have a known sensitivity to dexamethasone. . DO NOT use during Magnetic  Resonance Imaging (MRI). . DO NOT use over broken or compromised skin (e.g. sunburn, cuts, or acne) due to the increased risk of skin reaction. . DO NOT SHAVE over the area to be treated:  To establish good contact between the Patch and the skin, excessive hair may be clipped. . DO NOT place the Patch or electrodes on or over your eyes, directly over your heart, or brain. . DO NOT reuse the Patch or electrodes as this may cause burns to occur.

## 2017-10-23 ENCOUNTER — Encounter: Payer: BLUE CROSS/BLUE SHIELD | Admitting: Physical Therapy

## 2017-10-26 ENCOUNTER — Encounter: Payer: BLUE CROSS/BLUE SHIELD | Admitting: Rehabilitative and Restorative Service Providers"

## 2017-10-26 ENCOUNTER — Encounter: Payer: BLUE CROSS/BLUE SHIELD | Admitting: Physical Therapy

## 2017-11-02 ENCOUNTER — Encounter: Payer: BLUE CROSS/BLUE SHIELD | Admitting: Rehabilitative and Restorative Service Providers"

## 2017-11-03 DIAGNOSIS — G4733 Obstructive sleep apnea (adult) (pediatric): Secondary | ICD-10-CM | POA: Diagnosis not present

## 2017-11-06 ENCOUNTER — Ambulatory Visit: Payer: BLUE CROSS/BLUE SHIELD | Admitting: Family Medicine

## 2017-11-16 ENCOUNTER — Ambulatory Visit: Payer: BLUE CROSS/BLUE SHIELD | Admitting: Family Medicine

## 2017-11-16 ENCOUNTER — Encounter: Payer: BLUE CROSS/BLUE SHIELD | Admitting: Rehabilitative and Restorative Service Providers"

## 2017-11-21 DIAGNOSIS — I471 Supraventricular tachycardia: Secondary | ICD-10-CM | POA: Diagnosis not present

## 2017-11-23 DIAGNOSIS — F411 Generalized anxiety disorder: Secondary | ICD-10-CM | POA: Diagnosis not present

## 2017-11-28 ENCOUNTER — Ambulatory Visit: Payer: BLUE CROSS/BLUE SHIELD | Admitting: Family Medicine

## 2017-11-28 ENCOUNTER — Ambulatory Visit (INDEPENDENT_AMBULATORY_CARE_PROVIDER_SITE_OTHER): Payer: Self-pay | Admitting: Family Medicine

## 2017-11-28 ENCOUNTER — Encounter: Payer: Self-pay | Admitting: Family Medicine

## 2017-11-28 VITALS — BP 115/71 | HR 87 | Temp 98.2°F | Wt 251.0 lb

## 2017-11-28 DIAGNOSIS — F411 Generalized anxiety disorder: Secondary | ICD-10-CM | POA: Diagnosis not present

## 2017-11-28 DIAGNOSIS — M7581 Other shoulder lesions, right shoulder: Secondary | ICD-10-CM

## 2017-11-28 NOTE — Progress Notes (Signed)
Jacqueline Griffin is a 42 y.o. female who presents to [facility] today for shoulder pain follow up.   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 She had a subacromial bursa injection and completed one month of physical therapy and is feeling much better. She says that she can still tell something is "not right" in her right shoulder but the pain is significantly improved. She does not take any medication for the pain. She has returned to her regular daily activities.     ROS:  As above  Exam:  BP 115/71   Pulse 87   Temp 98.2 F (36.8 C) (Oral)   Wt 251 lb (113.9 kg)   BMI 40.51 kg/m  General: Well Developed, well nourished, and in no acute distress.  Neuro/Psych: Alert and oriented x3, extra-ocular muscles intact, able to move all 4 extremities, sensation grossly intact. Skin: Warm and dry, no rashes noted.  Respiratory: Not using accessory muscles, speaking in full sentences, trachea midline.  Cardiovascular: Pulses palpable, no extremity edema. Abdomen: Does not appear distended. Right Shoulder: normal appearing with no obvious deformities  Non tender to palpation in the subacromial space  ROM not limited to abduction or external rotation. Internal rotation to the upper thoracic spine  Normal strength.  Negative Hawkin's and Neer's tests     Lab and Radiology Results  CLINICAL DATA:  Pt was lifting some totes this am and injured her RT shoulder. Decreased range of motion. No hx surg.  EXAM: RIGHT SHOULDER - 2+ VIEW  COMPARISON:  None.  FINDINGS: No fracture.  No bone lesion.  Glenohumeral joint is normally spaced and aligned. Mild narrowing of the [facility] joint consistent with mild osteoarthritis.  There are calcifications adjacent to the superior greater tuberosity of the humerus consistent with rotator cuff calcific tendinitis. Soft tissues are otherwise unremarkable.  IMPRESSION: 1. No  fracture or acute finding. 2. Findings consistent with rotator cuff calcific tendinitis. 3. Mild AC joint osteoarthritis.   Electronically Signed   By: Lajean Manes M.D.   On: 09/16/2017 14:32 I personally (independently) visualized and performed the interpretation of the images attached in this note.   Assessment and Plan: 42 y.o. female with calcific tendonitis. Her pain has significantly improved following the subacromial injection and physical therapy. The plan will be to continue home exercises and come back and see me if pain returns. Will likely repeat injection and physical therapy if pain returns.   Influenza vaccine denied today.    I spent 15 minutes with this patient, greater than 50% was face-to-face time counseling regarding ddx and plan.   Historical information moved to improve visibility of documentation.  Past Medical History:  Diagnosis Date  . Hypertension 09/18/2017  . Morbid obesity ([place]) 04/01/2012  . NASH (nonalcoholic steatohepatitis) 11/28/2012  . OSA on CPAP 04/29/2016  . Paroxysmal supraventricular tachycardia ([place]) 04/01/2009   Overview:   . Supraventricular tachycardia ([place]) 1997  . Thyroid nodule 11/07/2014  . Uterine fibroid    Past Surgical History:  Procedure Laterality Date  . CESAREAN SECTION    . HIP SURGERY    . MANDIBLE RECONSTRUCTION  42 yo   Social History   Tobacco Use  . Smoking status: Never Smoker  . Smokeless tobacco: Never Used  Substance Use Topics  . Alcohol use: No   family history includes Hypertension in her father; Leukemia in her mother.  Medications: Current Outpatient Medications  Medication Sig Dispense Refill  .  clarithromycin (BIAXIN) 250 MG/5ML suspension Take by mouth 2 (two) times daily.    . diclofenac sodium (VOLTAREN) 1 % GEL Apply 2 g topically 4 (four) times daily. To affected joint. 100 g 11  . ferrous sulfate 325 (65 FE) MG tablet Take 1 tablet (325 mg total) by mouth daily. 30 tablet 5  . ibuprofen  (ADVIL,MOTRIN) 600 MG tablet Take 1 tablet (600 mg total) by mouth every 6 (six) hours as needed. 30 tablet 0  . metoprolol succinate (TOPROL-XL) 100 MG 24 hr tablet Take 100 mg by mouth daily. Take with or immediately following a meal.     No current facility-administered medications for this visit.    Allergies  Allergen Reactions  . Shrimp [Shellfish Allergy] Anaphylaxis  . Amoxicillin   . Clindamycin/Lincomycin     Palpitations       Discussed warning signs or symptoms. Please see discharge instructions. Patient expresses understanding.  I personally was present and performed or re-performed the history, physical exam and medical decision-making activities of this service and have verified that the service and findings are accurately documented in the student's note. ___________________________________________ Lynne Leader M.D., ABFM., CAQSM. Primary Care and Sports Medicine Adjunct Instructor of [employer]

## 2017-11-28 NOTE — Patient Instructions (Signed)
Thank you for coming in today. Continue home exercise program.  Recheck as needed.

## 2017-11-30 ENCOUNTER — Encounter: Payer: BLUE CROSS/BLUE SHIELD | Admitting: Rehabilitative and Restorative Service Providers"

## 2017-12-07 DIAGNOSIS — F411 Generalized anxiety disorder: Secondary | ICD-10-CM | POA: Diagnosis not present

## 2017-12-11 DIAGNOSIS — F411 Generalized anxiety disorder: Secondary | ICD-10-CM | POA: Diagnosis not present

## 2017-12-18 DIAGNOSIS — F411 Generalized anxiety disorder: Secondary | ICD-10-CM | POA: Diagnosis not present

## 2017-12-25 DIAGNOSIS — F411 Generalized anxiety disorder: Secondary | ICD-10-CM | POA: Diagnosis not present

## 2018-01-01 DIAGNOSIS — F411 Generalized anxiety disorder: Secondary | ICD-10-CM | POA: Diagnosis not present

## 2018-01-03 ENCOUNTER — Ambulatory Visit: Payer: BLUE CROSS/BLUE SHIELD | Admitting: Family Medicine

## 2018-01-03 ENCOUNTER — Encounter: Payer: Self-pay | Admitting: Family Medicine

## 2018-01-03 VITALS — BP 137/65 | HR 65 | Ht 67.0 in | Wt 248.0 lb

## 2018-01-03 DIAGNOSIS — M25561 Pain in right knee: Secondary | ICD-10-CM

## 2018-01-03 MED ORDER — DICLOFENAC SODIUM 1 % TD GEL: 2.0000 g | Freq: Four times a day (QID) | TRANSDERMAL | 11 refills | Status: DC

## 2018-01-03 NOTE — Progress Notes (Signed)
Jacqueline Griffin is a 42 y.o. female who presents to [facility] today for right knee pain.   Jacqueline Griffin was in her normal state of health about 2 weeks ago.  She was kneeling on her floor when she stood up she developed pain in the anterior and medial knee.  She notes continued pain in the anterior medial knee.  Pain is worse with knee flexion and kneeling.  She had some initial swelling.  She tried ice and ibuprofen which helped a bit.  Additionally she tried some knee brace which helped a bit as well.  Pain is slightly improving but still quite obnoxious.  She denies any injury.    ROS:  As above  Exam:  BP 137/65   Pulse 65   Ht 5\' 7"  (1.702 m)   Wt 248 lb (112.5 kg)   BMI 38.84 kg/m  General: Well Developed, well nourished, and in no acute distress.  Neuro/Psych: Alert and oriented x3, extra-ocular muscles intact, able to move all 4 extremities, sensation grossly intact. Skin: Warm and dry, no rashes noted.  Respiratory: Not using accessory muscles, speaking in full sentences, trachea midline.  Cardiovascular: Pulses palpable, no extremity edema. Abdomen: Does not appear distended. MSK:  Right knee largely normal-appearing with no significant effusion or erythema. Mildly tender to palpation at the anterior medial tibia but not particularly tender overlying the patella or patellar tendon. Patient is most tender overlying the medial joint line Range of motion is full 0-120 degrees. Stable ligamentous exam. Positive medial McMurray's test. Intact flexion and extension strength. Normal gait.       Assessment and Plan: 42 y.o. female with  Right knee pain.  Likely resolving prepatellar bursitis.  Patient may also have a component of degenerative medial meniscus tear given her pain at the medial joint line and a positive medial McMurray's test.  Plan for a bit of watchful waiting with treatment including ice rest and diclofenac gel.   Recheck in about 4 weeks if not improving.  Next step would be x-ray and injection.    No orders of the defined types were placed in this encounter.  Meds ordered this encounter  Medications  . diclofenac sodium (VOLTAREN) 1 % GEL    Sig: Apply 2 g topically 4 (four) times daily. To affected joint.    Dispense:  100 g    Refill:  11    Historical information moved to improve visibility of documentation.  Past Medical History:  Diagnosis Date  . Hypertension 09/18/2017  . Morbid obesity ([place]) 04/01/2012  . NASH (nonalcoholic steatohepatitis) 11/28/2012  . OSA on CPAP 04/29/2016  . Paroxysmal supraventricular tachycardia ([place]) 04/01/2009   Overview:   . Supraventricular tachycardia ([place]) 1997  . Thyroid nodule 11/07/2014  . Uterine fibroid    Past Surgical History:  Procedure Laterality Date  . CESAREAN SECTION    . HIP SURGERY    . MANDIBLE RECONSTRUCTION  41 yo   Social History   Tobacco Use  . Smoking status: Never Smoker  . Smokeless tobacco: Never Used  Substance Use Topics  . Alcohol use: No   family history includes Hypertension in her father; Leukemia in her mother.  Medications: Current Outpatient Medications  Medication Sig Dispense Refill  . diclofenac sodium (VOLTAREN) 1 % GEL Apply 2 g topically 4 (four) times daily. To affected joint. 100 g 11  . ferrous sulfate 325 (65 FE) MG tablet Take 1 tablet (325 mg total) by mouth daily.  30 tablet 5  . ibuprofen (ADVIL,MOTRIN) 600 MG tablet Take 1 tablet (600 mg total) by mouth every 6 (six) hours as needed. 30 tablet 0  . metoprolol succinate (TOPROL-XL) 100 MG 24 hr tablet Take 100 mg by mouth daily. Take with or immediately following a meal.     No current facility-administered medications for this visit.    Allergies  Allergen Reactions  . Shrimp [Shellfish Allergy] Anaphylaxis  . Amoxicillin   . Clindamycin/Lincomycin     Palpitations       Discussed warning signs or symptoms. Please see discharge  instructions. Patient expresses understanding.

## 2018-01-03 NOTE — Patient Instructions (Addendum)
Thank you for coming in today.  Apply the diclofenac gel up to 4x daily.  Recheck if not getting better in about 4 weeks.  Next step at that point is injection and xray.    Prepatellar Bursitis Rehab Ask your health care provider which exercises are safe for you. Do exercises exactly as told by your health care provider and adjust them as directed. It is normal to feel mild stretching, pulling, tightness, or discomfort as you do these exercises, but you should stop right away if you feel sudden pain or your pain gets worse.Do not begin these exercises until told by your health care provider. Stretching and range of motion exercises These exercises warm up your muscles and joints and improve the movement and flexibility of your knee. These exercises also help to relieve pain, numbness, and tingling. Exercise A: Hamstring, standing  1. Stand with your __________ foot resting on a chair. Your __________ leg should be fully extended. 2. Arch your lower back slightly. 3. Leading with your chest, lean forward at the waist until you feel a gentle stretch in the back of your __________ knee or in your thigh. You should not need to lean far to feel a stretch. 4. Hold this position for __________ seconds. Repeat __________ times. Complete this stretch __________ times a day. Exercise B: Knee flexion, active heel slides 1. Lie on your back with both knees straight. If this causes back discomfort, bend your __________ knee so your foot is flat on the floor. 2. Slowly slide your __________ heel back toward your buttocks until you feel a gentle stretch in the front of your knee or thigh. 3. Hold this position for __________ seconds. 4. Slowly slide your __________ heel back to the starting position. Repeat __________ times. Complete this stretch __________ times a day. Strengthening exercises These exercises build strength and endurance in your knee. Endurance is the ability to use your muscles for a  long time, even after they get tired. Exercise C: Quadriceps, isometric  1. Lie on your back with your __________ leg extended and your __________ knee bent. 2. Slowly tense the muscles in the front of your __________ thigh. When you do this, you should see your kneecap slide up toward your hip or see increased dimpling just above the knee. This motion will push the back of your knee down toward the surface that is under it. 3. For __________ seconds, keep the muscle as tight as you can without increasing your pain. 4. Relax the muscles slowly and completely. Repeat __________ times. Complete this exercise __________ times a day. Exercise D: Straight leg raises ( quadriceps) 1. Lie on your back with your __________ leg extended and your __________ knee bent. 2. Slowly tense the muscles in your __________ thigh. When you do this, you should see your kneecap slide up toward your hip or see increased dimpling just above the knee. 3. Keep these muscles tight as you raise your leg 4-6 inches (10-15 cm) off the floor. 4. Hold this position for __________ seconds. 5. Keep these muscles tense as you lower your leg slowly. 6. Relax your muscles slowly and completely. Repeat __________ times. Complete this exercise __________ times a day. Exercise E: Straight leg raises ( hip extensors) 1. Lie on your abdomen on a bed or a firm surface. You can put a pillow under your hips if that is more comfortable. 2. Tense your buttock muscles and lift your __________ thigh. Your __________ knee can be bent or straight,  but do not let your back arch. 3. Hold this position for __________ seconds. 4. Slowly lower your leg to the starting position. 5. Let your muscles relax completely. Repeat __________ times. Complete this exercise __________ times a day. This information is not intended to replace advice given to you by your health care provider. Make sure you discuss any questions you have with your health care  provider. Document Released: 03/14/2005 Document Revised: 11/17/2015 Document Reviewed: 12/12/2014 Elsevier Interactive Patient Education  2018 [employer].   Meniscus Tear A meniscus tear is a knee injury in which a piece of the meniscus is torn. The meniscus is a thick, rubbery, wedge-shaped cartilage in the knee. Two menisci are located in each knee. They sit between the upper bone (femur) and lower bone (tibia) that make up the knee joint. Each meniscus acts as a shock absorber for the knee. A torn meniscus is one of the most common types of knee injuries. This injury can range from mild to severe. Surgery may be needed for a severe tear. What are the causes? This injury may be caused by any squatting, twisting, or pivoting movement. Sports-related injuries are the most common cause. These often occur from:  Running and stopping suddenly.  Changing direction.  Being tackled or knocked off your feet.  As people get older, their meniscus gets thinner and weaker. In these people, tears can happen more easily, such as from climbing stairs. What increases the risk? This injury is more likely to happen to:  People who play contact sports.  Males.  People who are 25?41 years of age.  What are the signs or symptoms? Symptoms of this injury include:  Knee pain, especially at the side of the knee joint. You may feel pain when the injury occurs, or you may only hear a pop and feel pain later.  A feeling that your knee is clicking, catching, locking, or giving way.  Not being able to fully bend or extend your knee.  Bruising or swelling in your knee.  How is this diagnosed? This injury may be diagnosed based on your symptoms and a physical exam. The physical exam may include:  Moving your knee in different ways.  Feeling for tenderness.  Listening for a clicking sound.  Checking if your knee locks or catches.  You may also have tests, such as:  X-rays.  MRI.  A  procedure to look inside your knee with a narrow surgical telescope (arthroscopy).  You may be referred to a knee specialist (orthopedic surgeon). How is this treated? Treatment for this injury depends on the severity of the tear. Treatment for a mild tear may include:  Rest.  Medicine to reduce pain and swelling. This is usually a nonsteroidal anti-inflammatory drug (NSAID).  A knee brace or an elastic sleeve or wrap.  Using crutches or a walker to keep weight off your knee and to help you walk.  Exercises to strengthen your knee (physical therapy).  You may need surgery if you have a severe tear or if other treatments are not working. Follow these instructions at home: Managing pain and swelling  Take over-the-counter and prescription medicines only as told by your health care provider.  If directed, apply ice to the injured area: ? Put ice in a plastic bag. ? Place a towel between your skin and the bag. ? Leave the ice on for 20 minutes, 2-3 times per day.  Raise (elevate) the injured area above the level of your heart while  you are sitting or lying down. Activity  Do not use the injured limb to support your body weight until your health care provider says that you can. Use crutches or a walker as told by your health care provider.  Return to your normal activities as told by your health care provider. Ask your health care provider what activities are safe for you.  Perform range-of-motion exercises only as told by your health care provider.  Begin doing exercises to strengthen your knee and leg muscles only as told by your health care provider. After you recover, your health care provider may recommend these exercises to help prevent another injury. General instructions  Use a knee brace or elastic wrap as told by your health care provider.  Keep all follow-up visits as told by your health care provider. This is important. Contact a health care provider if:  You have a  fever.  Your knee becomes red, tender, or swollen.  Your pain medicine is not helping.  Your symptoms get worse or do not improve after 2 weeks of home care. This information is not intended to replace advice given to you by your health care provider. Make sure you discuss any questions you have with your health care provider. Document Released: 06/04/2002 Document Revised: 08/20/2015 Document Reviewed: 07/07/2014 Elsevier Interactive Patient Education  [employer].

## 2018-01-08 DIAGNOSIS — F411 Generalized anxiety disorder: Secondary | ICD-10-CM | POA: Diagnosis not present

## 2018-01-16 ENCOUNTER — Ambulatory Visit (INDEPENDENT_AMBULATORY_CARE_PROVIDER_SITE_OTHER): Payer: BLUE CROSS/BLUE SHIELD | Admitting: Family Medicine

## 2018-01-16 ENCOUNTER — Ambulatory Visit (INDEPENDENT_AMBULATORY_CARE_PROVIDER_SITE_OTHER): Payer: BLUE CROSS/BLUE SHIELD

## 2018-01-16 VITALS — BP 138/61 | HR 71 | Ht 67.0 in | Wt 250.0 lb

## 2018-01-16 DIAGNOSIS — M25561 Pain in right knee: Secondary | ICD-10-CM

## 2018-01-16 NOTE — Patient Instructions (Signed)
Thank you for coming in today. Continue the topical gel.  You should hear about MRI.  If you get all better from injection ok to cancel the MRI.  Recheck after MRI.  Call or go to the ER if you develop a large red swollen joint with extreme pain or oozing puss.    Meniscus Tear A meniscus tear is a knee injury in which a piece of the meniscus is torn. The meniscus is a thick, rubbery, wedge-shaped cartilage in the knee. Two menisci are located in each knee. They sit between the upper bone (femur) and lower bone (tibia) that make up the knee joint. Each meniscus acts as a shock absorber for the knee. A torn meniscus is one of the most common types of knee injuries. This injury can range from mild to severe. Surgery may be needed for a severe tear. What are the causes? This injury may be caused by any squatting, twisting, or pivoting movement. Sports-related injuries are the most common cause. These often occur from:  Running and stopping suddenly.  Changing direction.  Being tackled or knocked off your feet.  As people get older, their meniscus gets thinner and weaker. In these people, tears can happen more easily, such as from climbing stairs. What increases the risk? This injury is more likely to happen to:  People who play contact sports.  Males.  People who are 2?41 years of age.  What are the signs or symptoms? Symptoms of this injury include:  Knee pain, especially at the side of the knee joint. You may feel pain when the injury occurs, or you may only hear a pop and feel pain later.  A feeling that your knee is clicking, catching, locking, or giving way.  Not being able to fully bend or extend your knee.  Bruising or swelling in your knee.  How is this diagnosed? This injury may be diagnosed based on your symptoms and a physical exam. The physical exam may include:  Moving your knee in different ways.  Feeling for tenderness.  Listening for a clicking  sound.  Checking if your knee locks or catches.  You may also have tests, such as:  X-rays.  MRI.  A procedure to look inside your knee with a narrow surgical telescope (arthroscopy).  You may be referred to a knee specialist (orthopedic surgeon). How is this treated? Treatment for this injury depends on the severity of the tear. Treatment for a mild tear may include:  Rest.  Medicine to reduce pain and swelling. This is usually a nonsteroidal anti-inflammatory drug (NSAID).  A knee brace or an elastic sleeve or wrap.  Using crutches or a walker to keep weight off your knee and to help you walk.  Exercises to strengthen your knee (physical therapy).  You may need surgery if you have a severe tear or if other treatments are not working. Follow these instructions at home: Managing pain and swelling  Take over-the-counter and prescription medicines only as told by your health care provider.  If directed, apply ice to the injured area: ? Put ice in a plastic bag. ? Place a towel between your skin and the bag. ? Leave the ice on for 20 minutes, 2-3 times per day.  Raise (elevate) the injured area above the level of your heart while you are sitting or lying down. Activity  Do not use the injured limb to support your body weight until your health care provider says that you can. Use crutches or  a walker as told by your health care provider.  Return to your normal activities as told by your health care provider. Ask your health care provider what activities are safe for you.  Perform range-of-motion exercises only as told by your health care provider.  Begin doing exercises to strengthen your knee and leg muscles only as told by your health care provider. After you recover, your health care provider may recommend these exercises to help prevent another injury. General instructions  Use a knee brace or elastic wrap as told by your health care provider.  Keep all follow-up  visits as told by your health care provider. This is important. Contact a health care provider if:  You have a fever.  Your knee becomes red, tender, or swollen.  Your pain medicine is not helping.  Your symptoms get worse or do not improve after 2 weeks of home care. This information is not intended to replace advice given to you by your health care provider. Make sure you discuss any questions you have with your health care provider. Document Released: 06/04/2002 Document Revised: 08/20/2015 Document Reviewed: 07/07/2014 Elsevier Interactive Patient Education  [employer].

## 2018-01-16 NOTE — Progress Notes (Signed)
dg

## 2018-01-17 NOTE — Progress Notes (Signed)
Jacqueline Griffin is a 42 y.o. female who presents to [facility] today for right knee pain.  [name] was seen October 9 for right knee pain associate with popping and clicking.  This was concerning for meniscus injury or exacerbation of DJD.  She had a trial of conservative management including diclofenac gel which has helped as well as oral NSAIDs and knee sleeve.  She notes she continues to have pain clicking and swelling in her knee and is interested in injection and potentially surgery.  She denies any radiating pain weakness or numbness fevers or chills.    ROS:  As above  Exam:  BP 138/61   Pulse 71   Ht 5\' 7"  (1.702 m)   Wt 250 lb (113.4 kg)   BMI 39.16 kg/m  General: Well Developed, well nourished, and in no acute distress.  Neuro/Psych: Alert and oriented x3, extra-ocular muscles intact, able to move all 4 extremities, sensation grossly intact. Skin: Warm and dry, no rashes noted.  Respiratory: Not using accessory muscles, speaking in full sentences, trachea midline.  Cardiovascular: Pulses palpable, no extremity edema. Abdomen: Does not appear distended. MSK:  Right knee relatively normal-appearing without large effusion or deformity. Range of motion 0-110 degrees with retropatellar crepitations. Tender to palpation medial joint line. Stable ligamentous exam. Positive medial McMurray's test. Intact flexion and extension strength. Antalgic gait present.    Lab and Radiology Results Images right knee personally independently reviewed. Significant narrowing of the medial compartment and bone spur formation consistent with moderate DJD.  No acute fractures visible. Await formal radiology review.  Procedure: Real-time Ultrasound Guided Injection of right knee  Device: GE Logiq E   Images permanently stored and available for review in the ultrasound unit. Verbal informed consent obtained.  Discussed risks and benefits of  procedure. Warned about infection bleeding damage to structures skin hypopigmentation and fat atrophy among others. Patient expresses understanding and agreement Time-out conducted.   Noted no overlying erythema, induration, or other signs of local infection.   Skin prepped in a sterile fashion.   Local anesthesia: Topical Ethyl chloride.   With sterile technique and under real time ultrasound guidance:  80mg  depomedrol and 41ml marcaine injected easily.   Completed without difficulty   Pain immediately resolved suggesting accurate placement of the medication.   Advised to call if fevers/chills, erythema, induration, drainage, or persistent bleeding.   Images permanently stored and available for review in the ultrasound unit.  Impression: Technically successful ultrasound guided injection.      Assessment and Plan: 42 y.o. female with right knee pain and mechanical symptoms concerning for medial meniscus injury.  This is in the setting of moderate DJD on x-ray. Plan for injection as above.  Given the mechanical symptoms and the fact that her pain is worsening despite conservative management I think it is reasonable to proceed with an MRI for potential surgical planning.  Recheck following MRI.  Return sooner if needed.   Orders Placed This Encounter  Procedures  . DG Knee Complete 4 Views Right    Standing Status:   Future    Number of Occurrences:   1    Standing Expiration Date:   03/19/2019    Order Specific Question:   Reason for Exam (SYMPTOM  OR DIAGNOSIS REQUIRED)    Answer:   knee pain    Order Specific Question:   Is patient pregnant?    Answer:   No    Order Specific Question:  Preferred imaging location?    Answer:   [facility]    Order Specific Question:   Radiology Contrast Protocol - do NOT remove file path    Answer:   \\[email or web address]  . MR Knee Right Wo Contrast    Standing Status:   Future    Standing  Expiration Date:   03/19/2019    Order Specific Question:   What is the patient's sedation requirement?    Answer:   No Sedation    Order Specific Question:   Does the patient have a pacemaker or implanted devices?    Answer:   No    Order Specific Question:   Preferred imaging location?    Answer:   [facility] (table limit-350lbs)    Order Specific Question:   Radiology Contrast Protocol - do NOT remove file path    Answer:   \\[email or web address]\[email or web address]   No orders of the defined types were placed in this encounter.   Historical information moved to improve visibility of documentation.  Past Medical History:  Diagnosis Date  . Hypertension 09/18/2017  . Morbid obesity ([place]) 04/01/2012  . NASH (nonalcoholic steatohepatitis) 11/28/2012  . OSA on CPAP 04/29/2016  . Paroxysmal supraventricular tachycardia ([place]) 04/01/2009   Overview:   . Supraventricular tachycardia ([place]) 1997  . Thyroid nodule 11/07/2014  . Uterine fibroid    Past Surgical History:  Procedure Laterality Date  . CESAREAN SECTION    . HIP SURGERY    . MANDIBLE RECONSTRUCTION  42 yo   Social History   Tobacco Use  . Smoking status: Never Smoker  . Smokeless tobacco: Never Used  Substance Use Topics  . Alcohol use: No   family history includes Hypertension in her father; Leukemia in her mother.  Medications: Current Outpatient Medications  Medication Sig Dispense Refill  . diclofenac sodium (VOLTAREN) 1 % GEL Apply 2 g topically 4 (four) times daily. To affected joint. 100 g 11  . ferrous sulfate 325 (65 FE) MG tablet Take 1 tablet (325 mg total) by mouth daily. 30 tablet 5  . ibuprofen (ADVIL,MOTRIN) 600 MG tablet Take 1 tablet (600 mg total) by mouth every 6 (six) hours as needed. 30 tablet 0  . metoprolol succinate (TOPROL-XL) 100 MG 24 hr tablet Take 100 mg by mouth daily. Take with or immediately following a meal.     No current facility-administered medications for this visit.     Allergies  Allergen Reactions  . Shrimp [Shellfish Allergy] Anaphylaxis  . Amoxicillin   . Clindamycin/Lincomycin     Palpitations       Discussed warning signs or symptoms. Please see discharge instructions. Patient expresses understanding.

## 2018-01-18 DIAGNOSIS — F411 Generalized anxiety disorder: Secondary | ICD-10-CM | POA: Diagnosis not present

## 2018-01-29 DIAGNOSIS — F411 Generalized anxiety disorder: Secondary | ICD-10-CM | POA: Diagnosis not present

## 2018-02-05 ENCOUNTER — Ambulatory Visit (INDEPENDENT_AMBULATORY_CARE_PROVIDER_SITE_OTHER): Payer: BLUE CROSS/BLUE SHIELD

## 2018-02-05 DIAGNOSIS — M25561 Pain in right knee: Secondary | ICD-10-CM

## 2018-02-05 DIAGNOSIS — M1711 Unilateral primary osteoarthritis, right knee: Secondary | ICD-10-CM | POA: Diagnosis not present

## 2018-02-06 DIAGNOSIS — F411 Generalized anxiety disorder: Secondary | ICD-10-CM | POA: Diagnosis not present

## 2018-02-07 ENCOUNTER — Ambulatory Visit: Payer: BLUE CROSS/BLUE SHIELD | Admitting: Family Medicine

## 2018-02-08 ENCOUNTER — Ambulatory Visit: Payer: BLUE CROSS/BLUE SHIELD | Admitting: Family Medicine

## 2018-02-15 DIAGNOSIS — F411 Generalized anxiety disorder: Secondary | ICD-10-CM | POA: Diagnosis not present

## 2018-02-20 DIAGNOSIS — I493 Ventricular premature depolarization: Secondary | ICD-10-CM | POA: Diagnosis not present

## 2018-02-20 DIAGNOSIS — R002 Palpitations: Secondary | ICD-10-CM | POA: Diagnosis not present

## 2018-02-20 DIAGNOSIS — I491 Atrial premature depolarization: Secondary | ICD-10-CM | POA: Diagnosis not present

## 2018-03-01 DIAGNOSIS — G4733 Obstructive sleep apnea (adult) (pediatric): Secondary | ICD-10-CM | POA: Diagnosis not present

## 2018-03-01 DIAGNOSIS — F411 Generalized anxiety disorder: Secondary | ICD-10-CM | POA: Diagnosis not present

## 2018-03-03 DIAGNOSIS — R002 Palpitations: Secondary | ICD-10-CM | POA: Diagnosis not present

## 2018-03-06 DIAGNOSIS — F411 Generalized anxiety disorder: Secondary | ICD-10-CM | POA: Diagnosis not present

## 2018-03-11 DIAGNOSIS — I493 Ventricular premature depolarization: Secondary | ICD-10-CM | POA: Diagnosis not present

## 2018-03-11 DIAGNOSIS — R002 Palpitations: Secondary | ICD-10-CM | POA: Diagnosis not present

## 2018-03-11 DIAGNOSIS — I491 Atrial premature depolarization: Secondary | ICD-10-CM | POA: Diagnosis not present

## 2018-03-13 DIAGNOSIS — Z20828 Contact with and (suspected) exposure to other viral communicable diseases: Secondary | ICD-10-CM | POA: Diagnosis not present

## 2018-03-13 DIAGNOSIS — J111 Influenza due to unidentified influenza virus with other respiratory manifestations: Secondary | ICD-10-CM | POA: Diagnosis not present

## 2018-03-22 ENCOUNTER — Emergency Department
Admission: EM | Admit: 2018-03-22 | Discharge: 2018-03-22 | Disposition: A | Discharge status: Home / Self Care | Payer: BLUE CROSS/BLUE SHIELD | Source: Home / Self Care | Attending: Family Medicine | Admitting: Family Medicine

## 2018-03-22 ENCOUNTER — Emergency Department (INDEPENDENT_AMBULATORY_CARE_PROVIDER_SITE_OTHER): Payer: BLUE CROSS/BLUE SHIELD

## 2018-03-22 ENCOUNTER — Encounter: Payer: Self-pay | Admitting: *Deleted

## 2018-03-22 DIAGNOSIS — R042 Hemoptysis: Secondary | ICD-10-CM | POA: Diagnosis not present

## 2018-03-22 DIAGNOSIS — J209 Acute bronchitis, unspecified: Secondary | ICD-10-CM | POA: Diagnosis not present

## 2018-03-22 DIAGNOSIS — R05 Cough: Secondary | ICD-10-CM | POA: Diagnosis not present

## 2018-03-22 DIAGNOSIS — J101 Influenza due to other identified influenza virus with other respiratory manifestations: Secondary | ICD-10-CM

## 2018-03-22 MED ORDER — DOXYCYCLINE HYCLATE 100 MG PO CAPS: 100.0000 mg | ORAL_CAPSULE | Freq: Two times a day (BID) | ORAL | 0 refills | Status: DC

## 2018-03-22 NOTE — Discharge Instructions (Addendum)
Take plain guaifenesin (1200mg  extended release tabs such as Mucinex) twice daily, with plenty of water, for cough and congestion.  Get adequate rest.   May use Afrin nasal spray (or generic oxymetazoline) each morning for about 5 days and then discontinue.  Also recommend using saline nasal spray several times daily and saline nasal irrigation (AYR is a common brand).  Use Flonase nasal spray each morning after using Afrin nasal spray and saline nasal irrigation. Try warm salt water gargles for sore throat.  Stop all antihistamines for now, and other non-prescription cough/cold preparations. May take Delsym Cough Suppressant with Tessalon at bedtime for nighttime cough.

## 2018-03-22 NOTE — ED Triage Notes (Signed)
Pt c/o lingering productive cough post flu dx last week. She has taken Tessalon and Tamiflu.

## 2018-03-22 NOTE — ED Provider Notes (Signed)
[name]    CSN: [id number] Arrival date & time: 03/22/18  1050     History   Chief Complaint Chief Complaint  Patient presents with  . Cough    HPI Jacqueline Griffin is a 42 y.o. female.   Patient was diagnosed with influenza B eight days ago and prescribed Tamiflu, which she has finished.  She complains of a persistent lingering cough with increased chest congestion.  During the past three days she has had small amounts of blood in her sputum, but she denies pleuritic pain or shortness of breath.  Last night she had sweats. She has a past history of pneumonia.  The history is provided by the patient.    Past Medical History:  Diagnosis Date  . Hypertension 09/18/2017  . Morbid obesity ([place]) 04/01/2012  . NASH (nonalcoholic steatohepatitis) 11/28/2012  . OSA on CPAP 04/29/2016  . Paroxysmal supraventricular tachycardia ([place]) 04/01/2009   Overview:   . Supraventricular tachycardia ([place]) 1997  . Thyroid nodule 11/07/2014  . Uterine fibroid     Patient Active Problem List   Diagnosis Date Noted  . Hearing loss 09/18/2017  . Heart burn 09/18/2017  . Hypertension 09/18/2017  . OSA on CPAP 04/29/2016  . Thyroid nodule 11/07/2014  . NASH (nonalcoholic steatohepatitis) 11/28/2012  . Morbid obesity ([place]) 04/01/2012  . Menorrhagia 09/25/2010  . Paroxysmal supraventricular tachycardia ([place]) 04/01/2009    Past Surgical History:  Procedure Laterality Date  . CESAREAN SECTION    . HIP SURGERY    . MANDIBLE RECONSTRUCTION  42 yo    OB History    Gravida  2   Para      Term      Preterm      AB  1   Living  0     SAB  1   TAB      Ectopic      Multiple      Live Births               Home Medications    Prior to Admission medications   Medication Sig Start Date End Date Taking? Authorizing Provider  doxycycline (VIBRAMYCIN) 100 MG capsule Take 1 capsule (100 mg total) by mouth 2 (two) times daily. Take with food. 03/22/18   Kandra Nicolas, MD  ibuprofen (ADVIL,MOTRIN) 600 MG tablet Take 1 tablet (600 mg total) by mouth every 6 (six) hours as needed. 08/08/16   Noe Gens, PA-C  metoprolol succinate (TOPROL-XL) 100 MG 24 hr tablet Take 100 mg by mouth daily. Take with or immediately following a meal.    [provider]    Family History Family History  Problem Relation Age of Onset  . Hypertension Father   . Leukemia Mother     Social History Social History   Tobacco Use  . Smoking status: Never Smoker  . Smokeless tobacco: Never Used  Substance Use Topics  . Alcohol use: No  . Drug use: No     Allergies   Shrimp [shellfish allergy]; Amoxicillin; and Clindamycin/lincomycin   Review of Systems Review of Systems No sore throat + cough No pleuritic pain No wheezing Minimal nasal congestion No post-nasal drainage No sinus pain/pressure No itchy/red eyes No earache + hemoptysis No SOB No fever, + chills/sweats No nausea No vomiting No abdominal pain No diarrhea No urinary symptoms No skin rash + fatigue No myalgias No headache Used OTC meds without relief   Physical Exam Triage Vital Signs ED  Triage Vitals  Enc Vitals Group     BP 03/22/18 1214 (!) 153/83     Pulse Rate 03/22/18 1214 71     Resp 03/22/18 1214 18     Temp 03/22/18 1214 99 F (37.2 C)     Temp Source 03/22/18 1214 Oral     SpO2 03/22/18 1214 99 %     Weight 03/22/18 1215 245 lb (111.1 kg)     Height 03/22/18 1215 5\' 6"  (1.676 m)     Head Circumference --      Peak Flow --      Pain Score 03/22/18 1215 0     Pain Loc --      Pain Edu? --      Excl. in [place]? --    No data found.  Updated Vital Signs BP (!) 153/83 (BP Location: Right Arm)   Pulse 71   Temp 99 F (37.2 C) (Oral)   Resp 18   Ht 5\' 6"  (1.676 m)   Wt 111.1 kg   LMP 02/20/2018   SpO2 99%   BMI 39.54 kg/m   Visual Acuity Right Eye Distance:   Left Eye Distance:   Bilateral Distance:    Right Eye Near:   Left Eye Near:      Bilateral Near:     Physical Exam Nursing notes and Vital Signs reviewed. Appearance:  Patient appears stated age, and in no acute distress Eyes:  Pupils are equal, round, and reactive to light and accomodation.  Extraocular movement is intact.  Conjunctivae are not inflamed  Ears:  Canals normal.  Tympanic membranes normal.  Nose:  Mildly congested turbinates.  No sinus tenderness.  Pharynx:  Normal Neck:  Supple.  Enlarged posterior/lateral nodes are palpated bilaterally, tender to palpation on the left.   Lungs:  Clear to auscultation.  Breath sounds are equal.  Moving air well. Heart:  Regular rate and rhythm without murmurs, rubs, or gallops.  Abdomen:  Nontender without masses or hepatosplenomegaly.  Bowel sounds are present.  No CVA or flank tenderness.  Extremities:  No edema.  Skin:  No rash present.    UC Treatments / Results  Labs (all labs ordered are listed, but only abnormal results are displayed) Labs Reviewed - No data to display  EKG None  Radiology CLINICAL DATA:  42 year-old female was diagnosed with influenza 8 days ago. Persistent cough. Mild hemoptysis for 3 days.Diagnosed with influenza 8 days ago. Persistent cough. Mild hemoptysis for 3 days.  EXAM: CHEST - 2 VIEW  COMPARISON:  None.  FINDINGS: The heart size and mediastinal contours are within normal limits. Both lungs are clear. The visualized skeletal structures are unremarkable.  IMPRESSION: No active cardiopulmonary disease.   Electronically Signed   By: Suzy Bouchard M.D.   On: 03/22/2018 14:05  Procedures Procedures (including critical care time)  Medications Ordered in UC Medications - No data to display  Initial Impression / Assessment and Plan / UC Course  I have reviewed the triage vital signs and the nursing notes.  Pertinent labs & imaging results that were available during my care of the patient were reviewed by me and considered in my medical decision making  (see chart for details).    Begin doxycycline for atypical coverage of possible bronchitis.  Patient already has Rx for Tessalon. Followup with Family Doctor if not improved in one week.   Final Clinical Impressions(s) / UC Diagnoses   Final diagnoses:  Acute bronchitis, unspecified organism  Hemoptysis     Discharge Instructions     Take plain guaifenesin (1200mg  extended release tabs such as Mucinex) twice daily, with plenty of water, for cough and congestion.  Get adequate rest.   May use Afrin nasal spray (or generic oxymetazoline) each morning for about 5 days and then discontinue.  Also recommend using saline nasal spray several times daily and saline nasal irrigation (AYR is a common brand).  Use Flonase nasal spray each morning after using Afrin nasal spray and saline nasal irrigation. Try warm salt water gargles for sore throat.  Stop all antihistamines for now, and other non-prescription cough/cold preparations. May take Delsym Cough Suppressant with Tessalon at bedtime for nighttime cough.     ED Prescriptions    Medication Sig Dispense Auth. Provider   doxycycline (VIBRAMYCIN) 100 MG capsule Take 1 capsule (100 mg total) by mouth 2 (two) times daily. Take with food. 14 capsule Kandra Nicolas, MD        Kandra Nicolas, MD 03/24/18 [phone]

## 2018-03-26 DIAGNOSIS — F411 Generalized anxiety disorder: Secondary | ICD-10-CM | POA: Diagnosis not present

## 2018-03-30 DIAGNOSIS — Z9049 Acquired absence of other specified parts of digestive tract: Secondary | ICD-10-CM | POA: Diagnosis not present

## 2018-03-30 DIAGNOSIS — Z6839 Body mass index (BMI) 39.0-39.9, adult: Secondary | ICD-10-CM | POA: Diagnosis not present

## 2018-03-30 DIAGNOSIS — R1011 Right upper quadrant pain: Secondary | ICD-10-CM | POA: Diagnosis not present

## 2018-03-30 DIAGNOSIS — Z8619 Personal history of other infectious and parasitic diseases: Secondary | ICD-10-CM | POA: Diagnosis not present

## 2018-03-30 DIAGNOSIS — R160 Hepatomegaly, not elsewhere classified: Secondary | ICD-10-CM | POA: Diagnosis not present

## 2018-03-30 DIAGNOSIS — K219 Gastro-esophageal reflux disease without esophagitis: Secondary | ICD-10-CM | POA: Diagnosis not present

## 2018-03-30 DIAGNOSIS — K7581 Nonalcoholic steatohepatitis (NASH): Secondary | ICD-10-CM | POA: Diagnosis not present

## 2018-03-30 DIAGNOSIS — Z833 Family history of diabetes mellitus: Secondary | ICD-10-CM | POA: Diagnosis not present

## 2018-04-02 DIAGNOSIS — F411 Generalized anxiety disorder: Secondary | ICD-10-CM | POA: Diagnosis not present

## 2018-04-09 DIAGNOSIS — F411 Generalized anxiety disorder: Secondary | ICD-10-CM | POA: Diagnosis not present

## 2018-04-12 DIAGNOSIS — E041 Nontoxic single thyroid nodule: Secondary | ICD-10-CM | POA: Diagnosis not present

## 2018-04-12 DIAGNOSIS — E042 Nontoxic multinodular goiter: Secondary | ICD-10-CM | POA: Diagnosis not present

## 2018-04-17 DIAGNOSIS — F411 Generalized anxiety disorder: Secondary | ICD-10-CM | POA: Diagnosis not present

## 2018-04-23 DIAGNOSIS — F411 Generalized anxiety disorder: Secondary | ICD-10-CM | POA: Diagnosis not present

## 2018-04-27 ENCOUNTER — Encounter (HOSPITAL_BASED_OUTPATIENT_CLINIC_OR_DEPARTMENT_OTHER): Payer: Self-pay | Admitting: Adult Health

## 2018-04-27 ENCOUNTER — Other Ambulatory Visit: Payer: Self-pay

## 2018-04-27 ENCOUNTER — Emergency Department (HOSPITAL_BASED_OUTPATIENT_CLINIC_OR_DEPARTMENT_OTHER)
Admission: EM | Admit: 2018-04-27 | Discharge: 2018-04-27 | Disposition: A | Discharge status: Home / Self Care | Payer: BLUE CROSS/BLUE SHIELD | Attending: Emergency Medicine | Admitting: Emergency Medicine

## 2018-04-27 ENCOUNTER — Emergency Department (HOSPITAL_BASED_OUTPATIENT_CLINIC_OR_DEPARTMENT_OTHER): Payer: BLUE CROSS/BLUE SHIELD

## 2018-04-27 DIAGNOSIS — R1031 Right lower quadrant pain: Secondary | ICD-10-CM

## 2018-04-27 DIAGNOSIS — F419 Anxiety disorder, unspecified: Secondary | ICD-10-CM | POA: Diagnosis not present

## 2018-04-27 DIAGNOSIS — K5730 Diverticulosis of large intestine without perforation or abscess without bleeding: Secondary | ICD-10-CM | POA: Diagnosis not present

## 2018-04-27 DIAGNOSIS — K529 Noninfective gastroenteritis and colitis, unspecified: Secondary | ICD-10-CM | POA: Diagnosis not present

## 2018-04-27 DIAGNOSIS — R5383 Other fatigue: Secondary | ICD-10-CM | POA: Diagnosis not present

## 2018-04-27 DIAGNOSIS — R51 Headache: Secondary | ICD-10-CM | POA: Diagnosis not present

## 2018-04-27 DIAGNOSIS — I10 Essential (primary) hypertension: Secondary | ICD-10-CM | POA: Diagnosis not present

## 2018-04-27 DIAGNOSIS — R1011 Right upper quadrant pain: Secondary | ICD-10-CM | POA: Diagnosis not present

## 2018-04-27 DIAGNOSIS — Z79899 Other long term (current) drug therapy: Secondary | ICD-10-CM | POA: Insufficient documentation

## 2018-04-27 LAB — COMPREHENSIVE METABOLIC PANEL
ALBUMIN: 3.8 g/dL (ref 3.5–5.0)
ALK PHOS: 75 U/L (ref 38–126)
ALT: 14 U/L (ref 0–44)
AST: 15 U/L (ref 15–41)
Anion gap: 5 (ref 5–15)
BILIRUBIN TOTAL: 0.9 mg/dL (ref 0.3–1.2)
BUN: 8 mg/dL (ref 6–20)
CO2: 24 mmol/L (ref 22–32)
Calcium: 8.9 mg/dL (ref 8.9–10.3)
Chloride: 105 mmol/L (ref 98–111)
Creatinine, Ser: 0.43 mg/dL — ABNORMAL LOW (ref 0.44–1.00)
GFR calc non Af Amer: >60 mL/min (ref >60)
Glucose, Bld: 91 mg/dL (ref 70–99)
POTASSIUM: 3.5 mmol/L (ref 3.5–5.1)
SODIUM: 134 mmol/L — AB (ref 135–145)
TOTAL PROTEIN: 7.9 g/dL (ref 6.5–8.1)

## 2018-04-27 LAB — CBC WITH DIFFERENTIAL/PLATELET
Abs Immature Granulocytes: 0.02 10*3/uL (ref 0.00–0.07)
Basophils Absolute: 0 10*3/uL (ref 0.0–0.1)
Basophils Relative: 0 %
EOS ABS: 0.1 10*3/uL (ref 0.0–0.5)
Eosinophils Relative: 1 %
HCT: 38.3 % (ref 36.0–46.0)
HEMOGLOBIN: 12 g/dL (ref 12.0–15.0)
Immature Granulocytes: 0 %
Lymphocytes Relative: 17 %
Lymphs Abs: 1.9 10*3/uL (ref 0.7–4.0)
MCH: 23.7 pg — AB (ref 26.0–34.0)
MCHC: 31.3 g/dL (ref 30.0–36.0)
MCV: 75.7 fL — ABNORMAL LOW (ref 80.0–100.0)
MONOS PCT: 5 %
Monocytes Absolute: 0.5 10*3/uL (ref 0.1–1.0)
NEUTROS ABS: 8.7 10*3/uL — AB (ref 1.7–7.7)
NRBC: 0 % (ref 0.0–0.2)
Neutrophils Relative %: 77 %
Platelets: 372 10*3/uL (ref 150–400)
RBC: 5.06 MIL/uL (ref 3.87–5.11)
RDW: 17.2 % — AB (ref 11.5–15.5)
WBC: 11.3 10*3/uL — ABNORMAL HIGH (ref 4.0–10.5)

## 2018-04-27 LAB — URINALYSIS, ROUTINE W REFLEX MICROSCOPIC
BILIRUBIN URINE: NEGATIVE
Glucose, UA: NEGATIVE mg/dL
Hgb urine dipstick: NEGATIVE
KETONES UR: NEGATIVE mg/dL
LEUKOCYTES UA: NEGATIVE
NITRITE: NEGATIVE
PROTEIN: NEGATIVE mg/dL
Specific Gravity, Urine: 1.01 (ref 1.005–1.030)
pH: 6.5 (ref 5.0–8.0)

## 2018-04-27 LAB — PREGNANCY, URINE: PREG TEST UR: NEGATIVE

## 2018-04-27 LAB — LIPASE, BLOOD: Lipase: 25 U/L (ref 11–51)

## 2018-04-27 MED ORDER — DICYCLOMINE HCL 20 MG PO TABS: 20.0000 mg | ORAL_TABLET | Freq: Three times a day (TID) | ORAL | 0 refills | Status: DC | PRN

## 2018-04-27 MED ORDER — IOPAMIDOL (ISOVUE-300) INJECTION 61%
100.0000 mL | Freq: Once | INTRAVENOUS | Status: AC | PRN
  Administered 2018-04-27: 100 mL via INTRAVENOUS

## 2018-04-27 MED ORDER — CIPROFLOXACIN HCL 500 MG PO TABS
500.0000 mg | ORAL_TABLET | Freq: Once | ORAL | Status: AC
  Administered 2018-04-27: 500 mg via ORAL
  Filled 2018-04-27: qty 1

## 2018-04-27 MED ORDER — MORPHINE SULFATE (PF) 4 MG/ML IV SOLN
4.0000 mg | Freq: Once | INTRAVENOUS | Status: AC
  Administered 2018-04-27: 4 mg via INTRAVENOUS
  Filled 2018-04-27: qty 1

## 2018-04-27 MED ORDER — SODIUM CHLORIDE 0.9 % IV BOLUS
1000.0000 mL | Freq: Once | INTRAVENOUS | Status: AC
  Administered 2018-04-27: 1000 mL via INTRAVENOUS

## 2018-04-27 MED ORDER — METRONIDAZOLE 500 MG PO TABS
500.0000 mg | ORAL_TABLET | Freq: Once | ORAL | Status: AC
  Administered 2018-04-27: 500 mg via ORAL
  Filled 2018-04-27: qty 1

## 2018-04-27 MED ORDER — CIPROFLOXACIN HCL 500 MG PO TABS: 500.0000 mg | ORAL_TABLET | Freq: Two times a day (BID) | ORAL | 0 refills | Status: AC

## 2018-04-27 MED ORDER — METRONIDAZOLE 500 MG PO TABS: 500.0000 mg | ORAL_TABLET | Freq: Three times a day (TID) | ORAL | 0 refills | Status: AC

## 2018-04-27 MED ORDER — ONDANSETRON HCL 4 MG/2ML IJ SOLN
4.0000 mg | Freq: Once | INTRAMUSCULAR | Status: AC
  Administered 2018-04-27: 4 mg via INTRAVENOUS
  Filled 2018-04-27: qty 2

## 2018-04-27 MED ORDER — KETOROLAC TROMETHAMINE 15 MG/ML IJ SOLN
15.0000 mg | Freq: Once | INTRAMUSCULAR | Status: AC
  Administered 2018-04-27: 15 mg via INTRAVENOUS
  Filled 2018-04-27: qty 1

## 2018-04-27 MED FILL — metroNIDAZOLE 500 MG TABS: 500 | 7 days supply | Qty: 21 | Fill #0

## 2018-04-27 MED FILL — DICYCLOMINE 20 MG TABLET: 20 | 7 days supply | Qty: 21 | Fill #0

## 2018-04-27 MED FILL — CIPROFLOXACIN HCL 500 MG TA: 500 | 7 days supply | Qty: 14 | Fill #0

## 2018-04-27 NOTE — ED Notes (Signed)
Patient transported to CT 

## 2018-04-27 NOTE — ED Notes (Signed)
Pt given crackers and water. 

## 2018-04-27 NOTE — ED Provider Notes (Signed)
[facility] Provider Note   CSN: [id number] Arrival date & time: 04/27/18  5465     History   Chief Complaint Chief Complaint  Patient presents with  . Abdominal Pain    HPI Jacqueline Griffin is a 43 y.o. female.  HPI 43 year old female with past medical history as below here with right lower abdominal pain.  The patient states that for the last 24 hours, she has had gradual onset of progressively worsening right lower quadrant pain.  She first noticed her pain as an aching, throbbing sensation in her right hip/flank area.  It was initially mild.  Since then, the patient has had progressively worsening right lower quadrant pain.  It is worse with movement and palpation.  She has had associated mild loss of appetite.  No nausea vomiting.  No diarrhea.  No urinary symptoms.  No history of kidney stones.  She is status post cholecystectomy.  She has not take anything for it.  Past Medical History:  Diagnosis Date  . Hypertension 09/18/2017  . Morbid obesity ([place]) 04/01/2012  . NASH (nonalcoholic steatohepatitis) 11/28/2012  . OSA on CPAP 04/29/2016  . Paroxysmal supraventricular tachycardia ([place]) 04/01/2009   Overview:   . Supraventricular tachycardia ([place]) 1997  . Thyroid nodule 11/07/2014  . Uterine fibroid     Patient Active Problem List   Diagnosis Date Noted  . Hearing loss 09/18/2017  . Heart burn 09/18/2017  . Hypertension 09/18/2017  . OSA on CPAP 04/29/2016  . Thyroid nodule 11/07/2014  . NASH (nonalcoholic steatohepatitis) 11/28/2012  . Morbid obesity ([place]) 04/01/2012  . Menorrhagia 09/25/2010  . Paroxysmal supraventricular tachycardia ([place]) 04/01/2009    Past Surgical History:  Procedure Laterality Date  . CESAREAN SECTION    . HIP SURGERY    . MANDIBLE RECONSTRUCTION  43 yo     OB History    Gravida  2   Para      Term      Preterm      AB  1   Living  0     SAB  1   TAB      Ectopic      Multiple      Live Births                Home Medications    Prior to Admission medications   Medication Sig Start Date End Date Taking? Authorizing Provider  ciprofloxacin (CIPRO) 500 MG tablet Take 1 tablet (500 mg total) by mouth 2 (two) times daily for 7 days. 04/27/18 05/04/18  Duffy Bruce, MD  dicyclomine (BENTYL) 20 MG tablet Take 1 tablet (20 mg total) by mouth 3 (three) times daily as needed for spasms (abdominal pain). 04/27/18   Duffy Bruce, MD  doxycycline (VIBRAMYCIN) 100 MG capsule Take 1 capsule (100 mg total) by mouth 2 (two) times daily. Take with food. 03/22/18   Kandra Nicolas, MD  ibuprofen (ADVIL,MOTRIN) 600 MG tablet Take 1 tablet (600 mg total) by mouth every 6 (six) hours as needed. 08/08/16   Noe Gens, PA-C  metoprolol succinate (TOPROL-XL) 100 MG 24 hr tablet Take 100 mg by mouth daily. Take with or immediately following a meal.    [provider]  metroNIDAZOLE (FLAGYL) 500 MG tablet Take 1 tablet (500 mg total) by mouth 3 (three) times daily for 7 days. 04/27/18 05/04/18  Duffy Bruce, MD    Family History Family History  Problem Relation Age of Onset  . Hypertension  Father   . Leukemia Mother     Social History Social History   Tobacco Use  . Smoking status: Never Smoker  . Smokeless tobacco: Never Used  Substance Use Topics  . Alcohol use: No  . Drug use: No     Allergies   Shrimp [shellfish allergy]; Amoxicillin; and Clindamycin/lincomycin   Review of Systems Review of Systems  Constitutional: Positive for fatigue. Negative for chills and fever.  HENT: Negative for congestion and rhinorrhea.   Eyes: Negative for visual disturbance.  Respiratory: Negative for cough, shortness of breath and wheezing.   Cardiovascular: Negative for chest pain and leg swelling.  Gastrointestinal: Positive for abdominal pain. Negative for diarrhea, nausea and vomiting.  Genitourinary: Positive for flank pain. Negative for dysuria.  Musculoskeletal: Negative for  neck pain and neck stiffness.  Skin: Negative for rash and wound.  Allergic/Immunologic: Negative for immunocompromised state.  Neurological: Negative for syncope, weakness and headaches.  All other systems reviewed and are negative.    Physical Exam Updated Vital Signs BP (!) 145/59 (BP Location: Right Arm)   Pulse 74   Temp 98.1 F (36.7 C) (Oral)   Resp 18   Ht 5' 6"  (1.676 m)   Wt 111.1 kg   LMP 04/13/2018   SpO2 100%   BMI 39.54 kg/m   Physical Exam Vitals signs and nursing note reviewed.  Constitutional:      General: She is not in acute distress.    Appearance: She is well-developed.  HENT:     Head: Normocephalic and atraumatic.  Eyes:     Conjunctiva/sclera: Conjunctivae normal.  Neck:     Musculoskeletal: Neck supple.  Cardiovascular:     Rate and Rhythm: Normal rate and regular rhythm.     Heart sounds: Normal heart sounds. No murmur. No friction rub.  Pulmonary:     Effort: Pulmonary effort is normal. No respiratory distress.     Breath sounds: Normal breath sounds. No wheezing or rales.  Abdominal:     General: There is no distension.     Palpations: Abdomen is soft.     Tenderness: There is abdominal tenderness in the right upper quadrant and right lower quadrant. There is no right CVA tenderness, guarding or rebound.  Skin:    General: Skin is warm.     Capillary Refill: Capillary refill takes less than 2 seconds.  Neurological:     Mental Status: She is alert and oriented to person, place, and time.     Motor: No abnormal muscle tone.      ED Treatments / Results  Labs (all labs ordered are listed, but only abnormal results are displayed) Labs Reviewed  CBC WITH DIFFERENTIAL/PLATELET - Abnormal; Notable for the following components:      Result Value   WBC 11.3 (*)    MCV 75.7 (*)    MCH 23.7 (*)    RDW 17.2 (*)    Neutro Abs 8.7 (*)    All other components within normal limits  COMPREHENSIVE METABOLIC PANEL - Abnormal; Notable for the  following components:   Sodium 134 (*)    Creatinine, Ser 0.43 (*)    All other components within normal limits  LIPASE, BLOOD  URINALYSIS, ROUTINE W REFLEX MICROSCOPIC  PREGNANCY, URINE    EKG None  Radiology Ct Abdomen Pelvis W Contrast  Result Date: 04/27/2018 CLINICAL DATA:  Right lower quadrant abdominal pain. Suspect appendicitis. EXAM: CT ABDOMEN AND PELVIS WITH CONTRAST TECHNIQUE: Multidetector CT imaging of the abdomen  and pelvis was performed using the standard protocol following bolus administration of intravenous contrast. CONTRAST:  148m ISOVUE-300 IOPAMIDOL (ISOVUE-300) INJECTION 61% COMPARISON:  08/14/2015 FINDINGS: Lower chest: No acute abnormality. Hepatobiliary: No focal liver abnormalities. Previous cholecystectomy. No significant biliary ductal dilatation. Pancreas: Unremarkable. No pancreatic ductal dilatation or surrounding inflammatory changes. Spleen: Normal in size without focal abnormality. Adrenals/Urinary Tract: Normal appearance of the adrenal glands. The kidneys are unremarkable. No mass or hydronephrosis identified bilaterally. No kidney stones identified. Urinary bladder appears normal. Stomach/Bowel: Stomach appears normal. No abnormal small bowel dilatation identified. There is equivocal bowel wall thickening involving lower abdominal small bowel loops, image 40/5. No surrounding inflammation or free fluid noted. The appendix is visualized and appears normal. Normal appearance the proximal colon. Scattered colonic diverticula noted without acute inflammation. Vascular/Lymphatic: No significant vascular findings are present. No enlarged abdominal or pelvic lymph nodes. Reproductive: Uterus appears normal. Cyst in left ovary measures 2.2 cm and 22 HU. Other: No significant free fluid or fluid collections identified. Musculoskeletal: No acute or significant osseous findings. IMPRESSION: 1. No evidence for acute appendicitis. 2. There is equivocal/very mild wall  thickening of lower abdominal small bowel loops. Imaging findings may reflect uncomplicated inflammatory or infectious enteritis. Is air a history of inflammatory bowel disease?. 3. No evidence for bowel obstruction or abscess. 4. Mildly hyperdense left ovary cyst may represent a hemorrhagic cyst. Electronically Signed   By: TKerby MoorsM.D.   On: 04/27/2018 11:12    Procedures Procedures (including critical care time)  Medications Ordered in ED Medications  ketorolac (TORADOL) 15 MG/ML injection 15 mg (has no administration in time range)  ciprofloxacin (CIPRO) tablet 500 mg (has no administration in time range)  metroNIDAZOLE (FLAGYL) tablet 500 mg (has no administration in time range)  sodium chloride 0.9 % bolus 1,000 mL ( Intravenous Stopped 04/27/18 1031)  morphine 4 MG/ML injection 4 mg (4 mg Intravenous Given 04/27/18 0927)  ondansetron (ZOFRAN) injection 4 mg (4 mg Intravenous Given 04/27/18 0926)  iopamidol (ISOVUE-300) 61 % injection 100 mL (100 mLs Intravenous Contrast Given 04/27/18 1041)     Initial Impression / Assessment and Plan / ED Course  I have reviewed the triage vital signs and the nursing notes.  Pertinent labs & imaging results that were available during my care of the patient were reviewed by me and considered in my medical decision making (see chart for details).  Clinical Course as of Apr 27 1141  Fri Apr 27, 2018  1117 43yo F here with R sided abd pain, mild nausea. No vomiting, diarrhea. She is AF and well appearing. Labs show mild leukocytosis but are otherwise unremarkable. CT scan obtained and is c/w possible mild infectious colitis - no h/o IBD/Crohn's. Will start on empiric ABX, d/c with outpt PCP follow-up. If sx persist or are recurrent, may need outpt Gi follow-up. She is o/w well appearing, tolerating PO, without signs of sepsis or acute emergency.   [CI]  1140 Of note, I also discussed her low MCV.  She has an elevated RDW and I suspect this could be  iron deficiency.  She will start an iron supplement and follow-up with her PCP for this.   [CI]    Clinical Course User Index [CI] IDuffy Bruce MD     Final Clinical Impressions(s) / ED Diagnoses   Final diagnoses:  Enteritis  Right lower quadrant abdominal pain    ED Discharge Orders         Ordered  ciprofloxacin (CIPRO) 500 MG tablet  2 times daily     04/27/18 1141    metroNIDAZOLE (FLAGYL) 500 MG tablet  3 times daily     04/27/18 1141    dicyclomine (BENTYL) 20 MG tablet  3 times daily PRN     04/27/18 1141           Duffy Bruce, MD 04/27/18 1143

## 2018-04-27 NOTE — ED Triage Notes (Signed)
Presents with right lower-mid abdominal pain that radiates into right flank. Pai nis worse with lyinbg on right side and pressure on right lower abdomen. She denies vaginal discharge, denies dysuria. LMP last week and was normal. The pain is constant and started yesterday and is worsening over the course of the last 2 days. Denies nausesa and diarrhea. NOthing makes better.

## 2018-04-30 DIAGNOSIS — F411 Generalized anxiety disorder: Secondary | ICD-10-CM | POA: Diagnosis not present

## 2018-05-07 DIAGNOSIS — F411 Generalized anxiety disorder: Secondary | ICD-10-CM | POA: Diagnosis not present

## 2018-05-08 DIAGNOSIS — R51 Headache: Secondary | ICD-10-CM | POA: Diagnosis not present

## 2018-05-08 DIAGNOSIS — Z6839 Body mass index (BMI) 39.0-39.9, adult: Secondary | ICD-10-CM | POA: Diagnosis not present

## 2018-05-08 DIAGNOSIS — I10 Essential (primary) hypertension: Secondary | ICD-10-CM | POA: Diagnosis not present

## 2018-05-08 DIAGNOSIS — R1011 Right upper quadrant pain: Secondary | ICD-10-CM | POA: Diagnosis not present

## 2018-05-08 DIAGNOSIS — R718 Other abnormality of red blood cells: Secondary | ICD-10-CM | POA: Diagnosis not present

## 2018-05-08 DIAGNOSIS — E041 Nontoxic single thyroid nodule: Secondary | ICD-10-CM | POA: Diagnosis not present

## 2018-05-08 DIAGNOSIS — Z9989 Dependence on other enabling machines and devices: Secondary | ICD-10-CM | POA: Diagnosis not present

## 2018-05-08 DIAGNOSIS — G4733 Obstructive sleep apnea (adult) (pediatric): Secondary | ICD-10-CM | POA: Diagnosis not present

## 2018-05-08 DIAGNOSIS — E611 Iron deficiency: Secondary | ICD-10-CM | POA: Diagnosis not present

## 2018-05-08 DIAGNOSIS — N83202 Unspecified ovarian cyst, left side: Secondary | ICD-10-CM | POA: Diagnosis not present

## 2018-05-12 DIAGNOSIS — H00026 Hordeolum internum left eye, unspecified eyelid: Secondary | ICD-10-CM | POA: Diagnosis not present

## 2018-05-14 DIAGNOSIS — F411 Generalized anxiety disorder: Secondary | ICD-10-CM | POA: Diagnosis not present

## 2018-05-14 DIAGNOSIS — H00024 Hordeolum internum left upper eyelid: Secondary | ICD-10-CM | POA: Diagnosis not present

## 2018-05-16 DIAGNOSIS — Z9989 Dependence on other enabling machines and devices: Secondary | ICD-10-CM | POA: Diagnosis not present

## 2018-05-16 DIAGNOSIS — G4733 Obstructive sleep apnea (adult) (pediatric): Secondary | ICD-10-CM | POA: Diagnosis not present

## 2018-05-16 DIAGNOSIS — R51 Headache: Secondary | ICD-10-CM | POA: Diagnosis not present

## 2018-05-21 DIAGNOSIS — Z6839 Body mass index (BMI) 39.0-39.9, adult: Secondary | ICD-10-CM | POA: Diagnosis not present

## 2018-05-21 DIAGNOSIS — L709 Acne, unspecified: Secondary | ICD-10-CM | POA: Diagnosis not present

## 2018-05-21 DIAGNOSIS — Z01419 Encounter for gynecological examination (general) (routine) without abnormal findings: Secondary | ICD-10-CM | POA: Diagnosis not present

## 2018-05-29 DIAGNOSIS — F411 Generalized anxiety disorder: Secondary | ICD-10-CM | POA: Diagnosis not present

## 2018-05-30 DIAGNOSIS — G4733 Obstructive sleep apnea (adult) (pediatric): Secondary | ICD-10-CM | POA: Diagnosis not present

## 2018-05-31 DIAGNOSIS — G4733 Obstructive sleep apnea (adult) (pediatric): Secondary | ICD-10-CM | POA: Diagnosis not present

## 2018-06-02 DIAGNOSIS — R51 Headache: Secondary | ICD-10-CM | POA: Diagnosis not present

## 2018-06-04 DIAGNOSIS — F411 Generalized anxiety disorder: Secondary | ICD-10-CM | POA: Diagnosis not present

## 2018-06-26 DIAGNOSIS — F411 Generalized anxiety disorder: Secondary | ICD-10-CM | POA: Diagnosis not present

## 2018-07-06 DIAGNOSIS — H00021 Hordeolum internum right upper eyelid: Secondary | ICD-10-CM | POA: Diagnosis not present

## 2018-07-10 DIAGNOSIS — F411 Generalized anxiety disorder: Secondary | ICD-10-CM | POA: Diagnosis not present

## 2018-07-16 DIAGNOSIS — R51 Headache: Secondary | ICD-10-CM | POA: Diagnosis not present

## 2018-07-16 DIAGNOSIS — G4733 Obstructive sleep apnea (adult) (pediatric): Secondary | ICD-10-CM | POA: Diagnosis not present

## 2018-07-16 DIAGNOSIS — Z9989 Dependence on other enabling machines and devices: Secondary | ICD-10-CM | POA: Diagnosis not present

## 2018-07-17 DIAGNOSIS — F411 Generalized anxiety disorder: Secondary | ICD-10-CM | POA: Diagnosis not present

## 2018-07-23 DIAGNOSIS — F411 Generalized anxiety disorder: Secondary | ICD-10-CM | POA: Diagnosis not present

## 2018-07-27 DIAGNOSIS — L0293 Carbuncle, unspecified: Secondary | ICD-10-CM | POA: Diagnosis not present

## 2018-07-30 DIAGNOSIS — F411 Generalized anxiety disorder: Secondary | ICD-10-CM | POA: Diagnosis not present

## 2018-08-10 DIAGNOSIS — F411 Generalized anxiety disorder: Secondary | ICD-10-CM | POA: Diagnosis not present

## 2018-08-17 DIAGNOSIS — F411 Generalized anxiety disorder: Secondary | ICD-10-CM | POA: Diagnosis not present

## 2018-08-28 DIAGNOSIS — G4733 Obstructive sleep apnea (adult) (pediatric): Secondary | ICD-10-CM | POA: Diagnosis not present

## 2018-09-07 DIAGNOSIS — F411 Generalized anxiety disorder: Secondary | ICD-10-CM | POA: Diagnosis not present

## 2018-09-10 DIAGNOSIS — R51 Headache: Secondary | ICD-10-CM | POA: Diagnosis not present

## 2018-09-10 DIAGNOSIS — E041 Nontoxic single thyroid nodule: Secondary | ICD-10-CM | POA: Diagnosis not present

## 2018-09-18 DIAGNOSIS — F411 Generalized anxiety disorder: Secondary | ICD-10-CM | POA: Diagnosis not present

## 2019-11-18 DIAGNOSIS — F411 Generalized anxiety disorder: Secondary | ICD-10-CM | POA: Diagnosis not present

## 2019-12-04 DIAGNOSIS — Z20828 Contact with and (suspected) exposure to other viral communicable diseases: Secondary | ICD-10-CM | POA: Diagnosis not present

## 2020-01-03 DIAGNOSIS — G4733 Obstructive sleep apnea (adult) (pediatric): Secondary | ICD-10-CM | POA: Diagnosis not present

## 2020-01-08 DIAGNOSIS — N898 Other specified noninflammatory disorders of vagina: Secondary | ICD-10-CM | POA: Diagnosis not present

## 2020-01-22 DIAGNOSIS — G4733 Obstructive sleep apnea (adult) (pediatric): Secondary | ICD-10-CM | POA: Diagnosis not present

## 2020-01-22 DIAGNOSIS — I493 Ventricular premature depolarization: Secondary | ICD-10-CM | POA: Diagnosis not present

## 2020-01-22 DIAGNOSIS — Z79899 Other long term (current) drug therapy: Secondary | ICD-10-CM | POA: Diagnosis not present

## 2020-01-22 DIAGNOSIS — R55 Syncope and collapse: Secondary | ICD-10-CM | POA: Diagnosis not present

## 2020-01-22 DIAGNOSIS — Z6841 Body Mass Index (BMI) 40.0 and over, adult: Secondary | ICD-10-CM | POA: Diagnosis not present

## 2020-01-22 DIAGNOSIS — I471 Supraventricular tachycardia: Secondary | ICD-10-CM | POA: Diagnosis not present

## 2020-01-28 DIAGNOSIS — Z1231 Encounter for screening mammogram for malignant neoplasm of breast: Secondary | ICD-10-CM | POA: Diagnosis not present

## 2020-01-28 DIAGNOSIS — N898 Other specified noninflammatory disorders of vagina: Secondary | ICD-10-CM | POA: Diagnosis not present

## 2020-01-28 DIAGNOSIS — R102 Pelvic and perineal pain: Secondary | ICD-10-CM | POA: Diagnosis not present

## 2020-02-05 IMAGING — MR MR KNEE*R* W/O CM
7 series · 40 of 40 positions shown · non-contrast
Comparison: Radiographs 01/16/2018

CLINICAL DATA: Anterior knee pain for 1 month.

EXAM:
MRI OF THE RIGHT KNEE WITHOUT CONTRAST
TECHNIQUE: Multiplanar, multisequence MR imaging of the knee was performed. No
intravenous contrast was administered.

[Series 5: T2 fat-sat · axial · 4.0mm · 0.53mm/px · z∈[-52,+118]mm · 6 of 35 slices shown (1 of 3)]
[im 1/35]
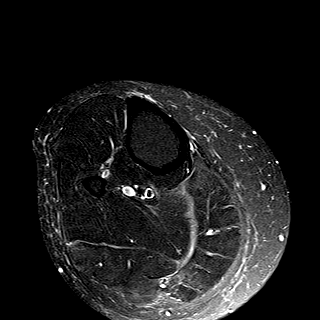
[im 7/35]
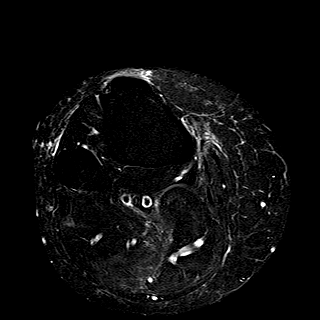
[im 14/35]
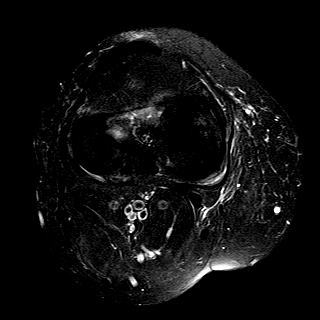
[im 21/35]
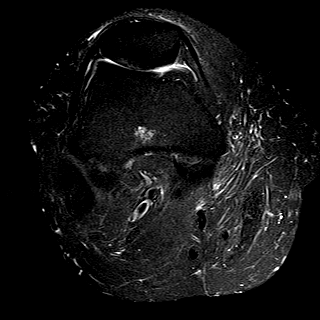
[im 28/35]
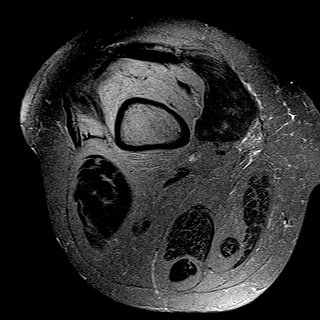
[im 35/35]
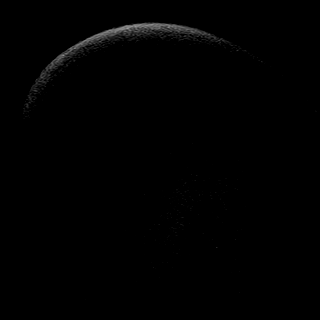

[Series 6: T1 · coronal · 4.0mm · 0.62mm/px · 6 of 31 slices shown]
[im 1/31]
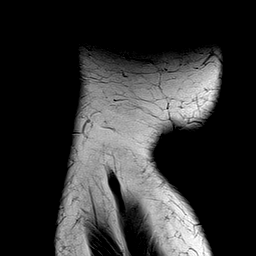
[im 7/31]
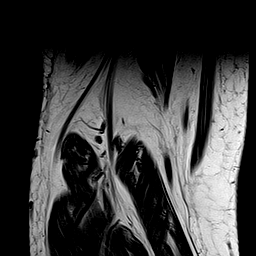
[im 13/31]
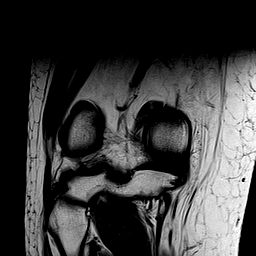
[im 19/31]
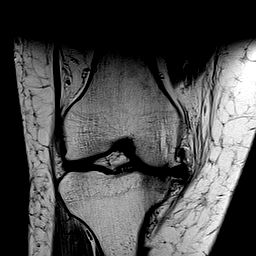
[im 25/31]
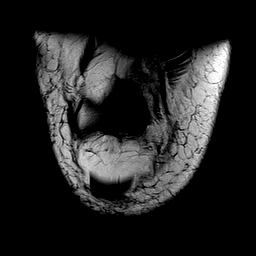
[im 31/31]
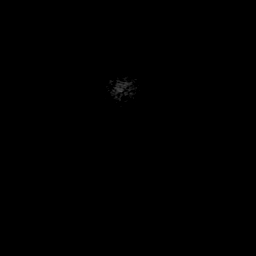

[Series 7: T2 fat-sat · coronal · 4.0mm · 0.62mm/px · 6 of 31 slices shown (2 of 3)]
[im 1/31]
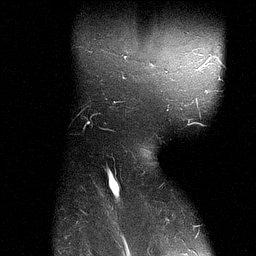
[im 7/31]
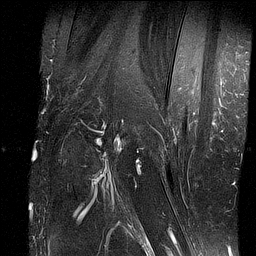
[im 13/31]
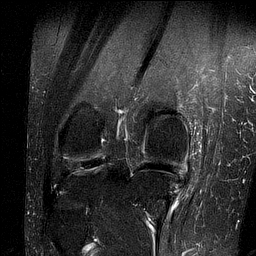
[im 19/31]
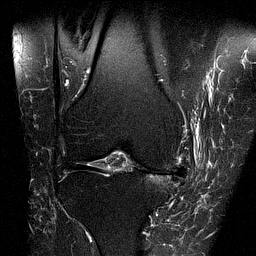
[im 25/31]
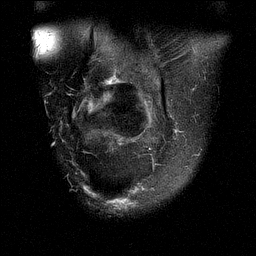
[im 31/31]
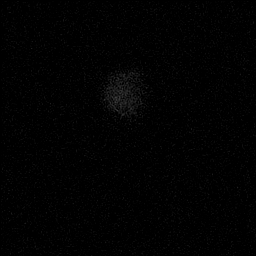

[Series 8: PD fat-sat · sagittal · 3.0mm · 0.62mm/px · 6 of 34 slices shown (1 of 3)]
[im 1/34]
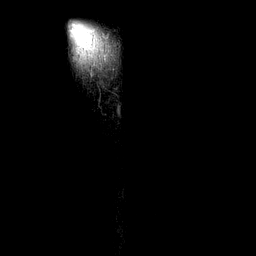
[im 7/34]
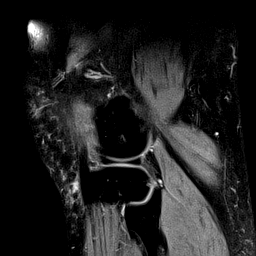
[im 14/34]
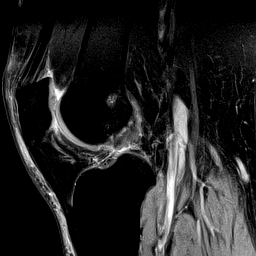
[im 20/34]
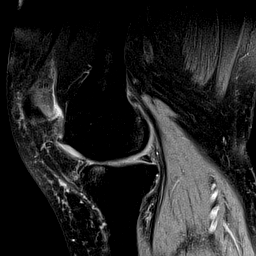
[im 27/34]
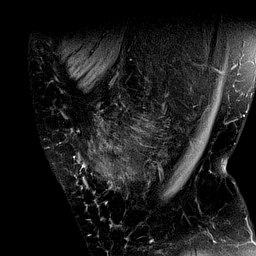
[im 34/34]
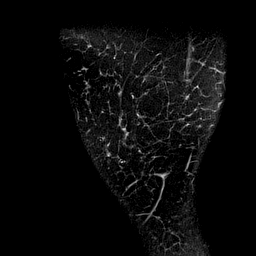

[Series 9: PD fat-sat · coronal · 4.0mm · 0.62mm/px · 6 of 31 slices shown (2 of 3)]
[im 1/31]
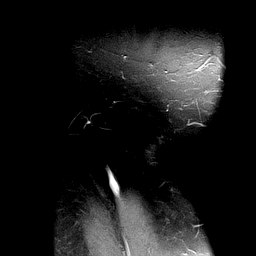
[im 7/31]
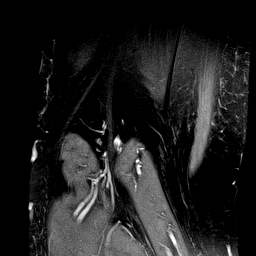
[im 13/31]
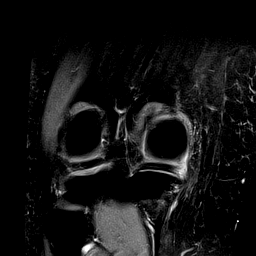
[im 19/31]
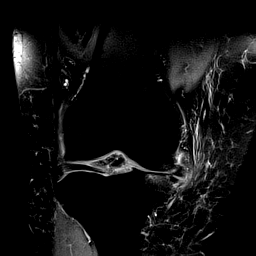
[im 25/31]
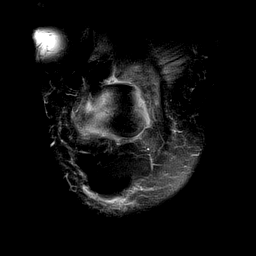
[im 31/31]
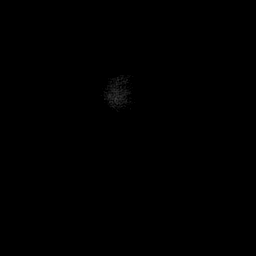

[Series 10: T2 fat-sat · sagittal · 3.0mm · 0.62mm/px · 6 of 35 slices shown (3 of 3)]
[im 1/35]
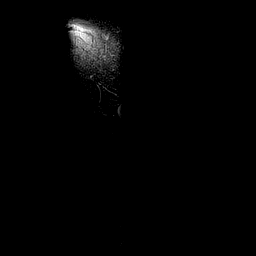
[im 7/35]
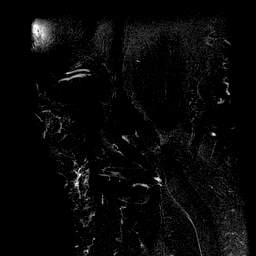
[im 14/35]
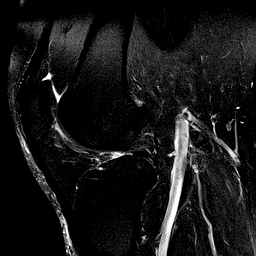
[im 21/35]
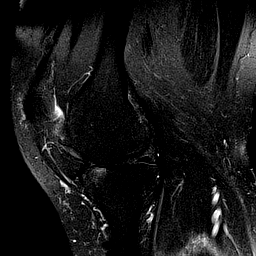
[im 28/35]
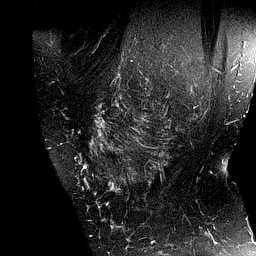
[im 35/35]
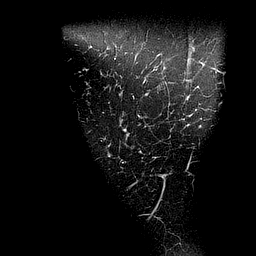

[Series 11: PD fat-sat · coronal · 2.0mm · 0.62mm/px · 4 of 19 slices shown (3 of 3)]
[im 1/19]
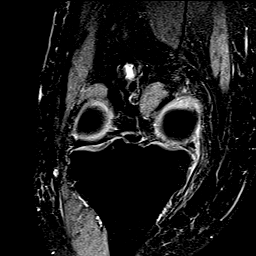
[im 7/19]
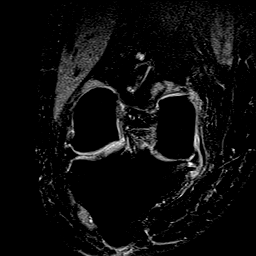
[im 13/19]
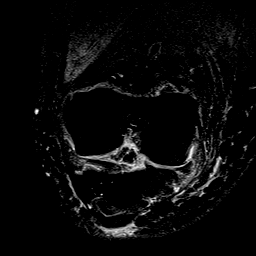
[im 19/19]
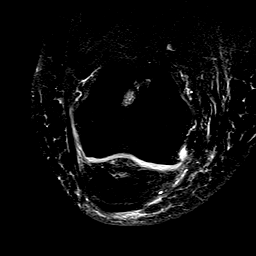

[40 of 40 positions shown; findings below may reference images not displayed]

FINDINGS: MENISCI

Medial meniscus: Degenerative fraying type changes involving the
free edge in the midbody region. Intrasubstance degenerative type
signal changes but no discrete meniscal tear.

Lateral meniscus:  Intact

LIGAMENTS

Cruciates:  Intact

Collaterals:  Intact

CARTILAGE

Patellofemoral: Moderate degenerative chondrosis with moderate
cartilage thinning and fraying mainly along the patellar apex.

Medial: Advanced degenerative chondrosis with areas of
full-thickness cartilage loss, joint space narrowing, osteophytic
spurring and subchondral cystic change.

Lateral:  Mild degenerative chondrosis.

Joint:  No joint effusion.

Popliteal Fossa:  No popliteal mass or Baker's cyst.

Extensor Mechanism: The patella retinacular structures are intact
and the quadriceps and patellar tendons are intact. Mild distal
quadriceps tendinopathy.

Bones: No acute bony findings. There is a small lesion in the
posterior metaphysis of the femur which appears to be a multi
septated cyst and is likely an intraosseous ganglion. It also could
be an enchondroma but I would expect to see that on the plain films
which I do not.

Other: Subtle edema like signal changes in the gastroc muscles,
particularly laterally which could be nonspecific myositis or a
muscle strain.
IMPRESSION: 1. Advanced medial compartment degenerative changes with areas of
full-thickness cartilage loss, joint space narrowing, osteophytic
spurring and subchondral cystic change and edema.
2. Associated degenerative changes involving the medial meniscus
without discrete tear.
3. Intact ligamentous structures and no acute bony findings.
4. Chondromalacia patella.
5. No joint effusion or Baker's cyst.

## 2020-02-06 DIAGNOSIS — F411 Generalized anxiety disorder: Secondary | ICD-10-CM | POA: Diagnosis not present

## 2020-02-06 DIAGNOSIS — I471 Supraventricular tachycardia: Secondary | ICD-10-CM | POA: Diagnosis not present

## 2020-02-06 DIAGNOSIS — R55 Syncope and collapse: Secondary | ICD-10-CM | POA: Diagnosis not present

## 2020-03-09 DIAGNOSIS — Z4509 Encounter for adjustment and management of other cardiac device: Secondary | ICD-10-CM | POA: Diagnosis not present

## 2020-03-10 DIAGNOSIS — Z4509 Encounter for adjustment and management of other cardiac device: Secondary | ICD-10-CM | POA: Diagnosis not present

## 2020-03-10 DIAGNOSIS — I493 Ventricular premature depolarization: Secondary | ICD-10-CM | POA: Diagnosis not present

## 2020-04-03 NOTE — Progress Notes (Signed)
I, Jacqueline Griffin, LAT, ATC, am serving as scribe for Dr. Lynne Leader.  Subjective:    CC: Right knee pain  HPI: Pt is a 45 y/o female presenting w/ c/o chronic R knee pain that began in 2019 when she was previously seen by Dr. Georgina Snell at [facility].  Her R knee pain has been flared up since last week when she was moving.  She locates her pain to her R ant and post knee w/ radiating pain noted into her R thigh and R lower leg.  She notes her knee pain worsened after she did a move where she had to go up and down stairs a lot.  Radiating pain: yes into her R thigh and lower leg R knee swelling: possibly yes R knee mechanical symptoms: yes Aggravates: walking/weight-bearing; transitioning from sit-to-stand; climbing stairs Rx tried: Biofreeze  Dx imaging: 02/05/18 R knee MRI  01/16/18 R knee XR  Pertinent review of Systems: No fevers or chills  Relevant historical information: SVT.  Hypertension.  Jacqueline Griffin.   Objective:    Vitals:   04/06/20 1504  BP: 120/74  Pulse: 81  SpO2: 99%   General: Well Developed, well nourished, and in no acute distress.   MSK: Right knee mild effusion otherwise normal. Normal motion with crepitation. Tender palpation medial joint line. Stable ligamentous exam. Intact strength.  Lab and Radiology Results  EXAM: MRI OF THE RIGHT KNEE WITHOUT CONTRAST  TECHNIQUE: Multiplanar, multisequence MR imaging of the knee was performed. No intravenous contrast was administered.  COMPARISON:  Radiographs 01/16/2018  FINDINGS: MENISCI  Medial meniscus: Degenerative fraying type changes involving the free edge in the midbody region. Intrasubstance degenerative type signal changes but no discrete meniscal tear.  Lateral meniscus:  Intact  LIGAMENTS  Cruciates:  Intact  Collaterals:  Intact  CARTILAGE  Patellofemoral: Moderate degenerative chondrosis with moderate cartilage thinning and fraying mainly along the  patellar apex.  Medial: Advanced degenerative chondrosis with areas of full-thickness cartilage loss, joint space narrowing, osteophytic spurring and subchondral cystic change.  Lateral:  Mild degenerative chondrosis.  Joint:  No joint effusion.  Popliteal Fossa:  No popliteal mass or Baker's cyst.  Extensor Mechanism: The patella retinacular structures are intact and the quadriceps and patellar tendons are intact. Mild distal quadriceps tendinopathy.  Bones: No acute bony findings. There is a small lesion in the posterior metaphysis of the femur which appears to be a multi septated cyst and is likely an intraosseous ganglion. It also could be an enchondroma but I would expect to see that on the plain films which I do not.  Other: Subtle edema like signal changes in the gastroc muscles, particularly laterally which could be nonspecific myositis or a muscle strain.  IMPRESSION: 1. Advanced medial compartment degenerative changes with areas of full-thickness cartilage loss, joint space narrowing, osteophytic spurring and subchondral cystic change and edema. 2. Associated degenerative changes involving the medial meniscus without discrete tear. 3. Intact ligamentous structures and no acute bony findings. 4. Chondromalacia patella. 5. No joint effusion or Baker's cyst.   Electronically Signed   By: Marijo Sanes M.D.   On: 02/06/2018 08:36  I, Lynne Leader, personally (independently) visualized and performed the interpretation of the images attached in this note.   Impression and Recommendations:    Assessment and Plan: 45 y.o. female with right knee pain.  Patient has arthritis present in her knee from MRI 2019.  I believe the main cause of her knee however is exacerbation of this arthritis  due to unusual increased activity during a move.  It is likely that with a little bit of rest and anti-inflammatories and her knee pain will return to her baseline.  We will try  using topical Pennsaid or over-the-counter Voltaren gel.  Pennsaid samples given today and Pennsaid was prescribed.  If not improving patient will return to clinic for steroid injection.[name]  PDMP not reviewed this encounter. No orders of the defined types were placed in this encounter.  Meds ordered this encounter  Medications  . Diclofenac Sodium (PENNSAID) 2 % SOLN    Sig: Place 1 application onto the skin 2 (two) times daily.    Dispense:  112 g    Refill:  2    Home Phone      [phone] Mobile          [phone]     Discussed warning signs or symptoms. Please see discharge instructions. Patient expresses understanding.   The above documentation has been reviewed and is accurate and complete Lynne Leader, M.D.

## 2020-04-06 ENCOUNTER — Encounter: Payer: Self-pay | Admitting: Family Medicine

## 2020-04-06 ENCOUNTER — Other Ambulatory Visit: Payer: Self-pay

## 2020-04-06 ENCOUNTER — Ambulatory Visit: Payer: BC Managed Care – PPO | Admitting: Family Medicine

## 2020-04-06 ENCOUNTER — Ambulatory Visit: Payer: Self-pay

## 2020-04-06 VITALS — BP 120/74 | HR 81 | Ht 66.0 in | Wt 262.2 lb

## 2020-04-06 DIAGNOSIS — M25561 Pain in right knee: Secondary | ICD-10-CM | POA: Diagnosis not present

## 2020-04-06 DIAGNOSIS — G8929 Other chronic pain: Secondary | ICD-10-CM

## 2020-04-06 MED ORDER — PENNSAID 2 % EX SOLN: 1.0000 "application " | Freq: Two times a day (BID) | CUTANEOUS | 2 refills | Status: DC

## 2020-04-06 NOTE — Patient Instructions (Addendum)
Thank you for coming in today.  Plan for trial of pennsiad or voltaren gel.   If not improving let me know. Next step would be steroid injection.   Pennsaid instructions: You have been given a sample/prescription for Pennsaid, a topical medication.     You are to apply this gel to your injured body part twice daily (morning and evening).   A little goes a long way so you can use about a pea-sized amount for each area.   Spread this small amount over the area into a thin film and let it dry.   Be sure that you do not rub the gel into your skin for more than 10 or 15 seconds otherwise it can irritate you skin.    Once you apply the gel, please do not put any other lotion or clothing in contact with that area for 30 minutes to allow the gel to absorb into your skin.   Some people are sensitive to the medication and can develop a sunburn-like rash.  If you have only mild symptoms it is okay to continue to use the medication but if you have any breakdown of your skin you should discontinue its use and please let us know.   If you have been written a prescription for Pennsaid, you will receive a pump bottle of this topical gel through a mail order pharmacy.  The instructions on the bottle will say to apply two pumps twice a day which may be too much gel for your particular area so use the pea-sized amount as your guide.  Instructions for Duexis, Pennsaid and Vimovo:  Your prescription will be filled through a participating [facility] mail order pharmacy.  You will receive a phone call or text from one of the participating pharmacies which can be located in any state in the [place].  You must communicate directly with them to have this medication filled.  When the pharmacy contacts you, they will need your mailing address (for shipment of the medication) andy they will need payment information if you have a copay (typically no more than $10). If you have not heard from them 2-3 days  after your appointment with Dr. Georgina Snell, contact [facility] directly at [phone].

## 2020-04-08 DIAGNOSIS — F411 Generalized anxiety disorder: Secondary | ICD-10-CM | POA: Diagnosis not present

## 2020-04-09 ENCOUNTER — Other Ambulatory Visit: Payer: Self-pay

## 2020-04-09 ENCOUNTER — Ambulatory Visit (INDEPENDENT_AMBULATORY_CARE_PROVIDER_SITE_OTHER): Payer: BC Managed Care – PPO | Admitting: Family Medicine

## 2020-04-09 ENCOUNTER — Ambulatory Visit (INDEPENDENT_AMBULATORY_CARE_PROVIDER_SITE_OTHER): Payer: BC Managed Care – PPO

## 2020-04-09 ENCOUNTER — Ambulatory Visit: Payer: Self-pay

## 2020-04-09 VITALS — BP 126/82 | HR 74 | Ht 66.0 in | Wt 261.2 lb

## 2020-04-09 DIAGNOSIS — M25561 Pain in right knee: Secondary | ICD-10-CM

## 2020-04-09 DIAGNOSIS — M7989 Other specified soft tissue disorders: Secondary | ICD-10-CM | POA: Diagnosis not present

## 2020-04-09 NOTE — Patient Instructions (Addendum)
You had a R knee injection today. Call or go to the ER if you develop a large red swollen joint with extreme pain or oozing puss.   You should hear soon about the ultrasound to evaluate for DVT.   If not improving quickly with the injection let me know.  Next step is MRI.

## 2020-04-09 NOTE — Progress Notes (Signed)
I, Jacqueline Griffin, LAT, ATC, am serving as scribe for Dr. Lynne Leader.  Jacqueline Griffin is a 45 y.o. female who presents to [place] at [facility] today for f/u of R knee pain. She was last seen by Dr. Georgina Snell on 04/06/20 w/ c/o acute flare of chronic R knee pain after moving and having to go up/down a significant amount of stairs. She was provided w/ samples of and a script for Pennsaid. Since her last visit, pt reports she fell on R knee on 04/09/19. Pt reports knee "buckled" on when carrying laundry up the steps. Swelling present. Pt locates pain to all over knee and extends up anterior thigh and into lower leg. Pt is unable to straighten leg.  Diagnostic testing: R knee MRI- 02/05/18; R knee XR- 01/16/18   Pertinent review of systems: No fevers or chills  Relevant historical information: SVT   Exam:  BP 126/82 (BP Location: Right Arm, Patient Position: Sitting, Cuff Size: Large)   Pulse 74   Ht 5' 6"  (1.676 m)   Wt 261 lb 3.2 oz (118.5 kg)   LMP 03/15/2020   SpO2 99%   BMI 42.16 kg/m  General: Well Developed, well nourished, and in no acute distress.   MSK: Right knee mild effusion.  Otherwise normal-appearing with no erythema or induration.  No contusion. Range of motion 5-100 degrees. Tender palpation medial joint line.  Not particular tender palpation anterior knee. Extension strength is present 4+/5 with pain.  Flexion strength is intact. Guarding during ligament exam testing nondiagnostic.  No frank laxity appreciated.    Lab and Radiology Results No results found for this or any previous visit (from the past 72 hour(s)). DG Knee AP/LAT W/Sunrise Right  Result Date: 04/09/2020 CLINICAL DATA:  Status post fall last night. Right knee pain. Initial encounter. EXAM: RIGHT KNEE 3 VIEWS COMPARISON:  Plain films right knee 01/16/2018. FINDINGS: No acute bony or joint abnormality is identified. Bone-on-bone medial compartment joint space narrowing and  osteophytosis again seen. No joint effusion or chondrocalcinosis. IMPRESSION: No acute abnormality. Advanced medial compartment osteoarthritis. Electronically Signed   By: Inge Rise M.D.   On: 04/09/2020 11:57  I, Lynne Leader, personally (independently) visualized and performed the interpretation of the images attached in this note.   Procedure: Real-time Ultrasound Guided Injection of right knee superior lateral patellar space Device: Philips Affiniti 50G Images permanently stored and available for review in PACS Verbal informed consent obtained.  Discussed risks and benefits of procedure. Warned about infection bleeding damage to structures skin hypopigmentation and fat atrophy among others. Patient expresses understanding and agreement Time-out conducted.   Noted no overlying erythema, induration, or other signs of local infection.   Skin prepped in a sterile fashion.   Local anesthesia: Topical Ethyl chloride.   With sterile technique and under real time ultrasound guidance:  40 mg of Kenalog and 2 L of Marcaine injected into the joint. Fluid seen entering the joint capsule.   Completed without difficulty   Pain moderately resolved suggesting accurate placement of the medication.   Advised to call if fevers/chills, erythema, induration, drainage, or persistent bleeding.   Images permanently stored and available for review in the ultrasound unit.  Impression: Technically successful ultrasound guided injection.        Assessment and Plan: 45 y.o. female with right knee pain.  Patient does have medial compartment DJD which probably explains a lot of her pain.  However she is complaining of more acute onset  of pain which may be exacerbation of the DJD or some new knee injury this not appreciated with previous ultrasound and x-ray today.  Plan for trial of injection.  If not significantly improving will proceed with MRI to evaluate for meniscus injury or ligament injury.   PDMP not  reviewed this encounter. Orders Placed This Encounter  Procedures  . DG Knee AP/LAT W/Sunrise Right    Standing Status:   Future    Number of Occurrences:   1    Standing Expiration Date:   04/09/2021    Order Specific Question:   Reason for Exam (SYMPTOM  OR DIAGNOSIS REQUIRED)    Answer:   right knee pain    Order Specific Question:   Preferred imaging location?    Answer:   [facility]    Order Specific Question:   Is patient pregnant?    Answer:   No  . [place] LIMITED JOINT SPACE STRUCTURES LOW RIGHT(NO LINKED CHARGES)    Standing Status:   Future    Number of Occurrences:   1    Standing Expiration Date:   10/07/2020    Order Specific Question:   Reason for Exam (SYMPTOM  OR DIAGNOSIS REQUIRED)    Answer:   right knee pain    Order Specific Question:   Preferred imaging location?    Answer:   [facility]   No orders of the defined types were placed in this encounter.    Discussed warning signs or symptoms. Please see discharge instructions. Patient expresses understanding.   The above documentation has been reviewed and is accurate and complete Lynne Leader, M.D.

## 2020-04-09 NOTE — Progress Notes (Signed)
X-ray right knee shows significant arthritis at the medial part of the knee.  Otherwise normal.

## 2020-04-10 ENCOUNTER — Other Ambulatory Visit: Payer: Self-pay

## 2020-04-10 ENCOUNTER — Ambulatory Visit (HOSPITAL_COMMUNITY)
Admission: RE | Admit: 2020-04-10 | Discharge: 2020-04-10 | Disposition: A | Discharge status: Home / Self Care | Payer: BC Managed Care – PPO | Source: Ambulatory Visit | Attending: Family Medicine | Admitting: Family Medicine

## 2020-04-10 DIAGNOSIS — G4733 Obstructive sleep apnea (adult) (pediatric): Secondary | ICD-10-CM | POA: Diagnosis not present

## 2020-04-10 DIAGNOSIS — R002 Palpitations: Secondary | ICD-10-CM | POA: Diagnosis not present

## 2020-04-10 DIAGNOSIS — R55 Syncope and collapse: Secondary | ICD-10-CM | POA: Diagnosis not present

## 2020-04-10 DIAGNOSIS — M7989 Other specified soft tissue disorders: Secondary | ICD-10-CM | POA: Diagnosis not present

## 2020-04-10 DIAGNOSIS — Z4509 Encounter for adjustment and management of other cardiac device: Secondary | ICD-10-CM | POA: Diagnosis not present

## 2020-04-10 DIAGNOSIS — Z95818 Presence of other cardiac implants and grafts: Secondary | ICD-10-CM | POA: Diagnosis not present

## 2020-04-11 NOTE — Progress Notes (Signed)
Prelim [place] study leg shows no DVT.

## 2020-04-14 NOTE — Progress Notes (Signed)
DVT Ultrasound final returned normal. No DVT seen

## 2020-04-27 DIAGNOSIS — Z20822 Contact with and (suspected) exposure to covid-19: Secondary | ICD-10-CM | POA: Diagnosis not present

## 2020-05-02 DIAGNOSIS — Z20822 Contact with and (suspected) exposure to covid-19: Secondary | ICD-10-CM | POA: Diagnosis not present

## 2020-05-13 DIAGNOSIS — F411 Generalized anxiety disorder: Secondary | ICD-10-CM | POA: Diagnosis not present

## 2020-05-18 DIAGNOSIS — R55 Syncope and collapse: Secondary | ICD-10-CM | POA: Diagnosis not present

## 2020-05-18 DIAGNOSIS — Z4509 Encounter for adjustment and management of other cardiac device: Secondary | ICD-10-CM | POA: Diagnosis not present

## 2020-05-18 DIAGNOSIS — R002 Palpitations: Secondary | ICD-10-CM | POA: Diagnosis not present

## 2020-05-18 DIAGNOSIS — R001 Bradycardia, unspecified: Secondary | ICD-10-CM | POA: Diagnosis not present

## 2020-05-28 DIAGNOSIS — F411 Generalized anxiety disorder: Secondary | ICD-10-CM | POA: Diagnosis not present

## 2020-05-29 DIAGNOSIS — I471 Supraventricular tachycardia: Secondary | ICD-10-CM | POA: Diagnosis not present

## 2020-06-09 DIAGNOSIS — F411 Generalized anxiety disorder: Secondary | ICD-10-CM | POA: Diagnosis not present

## 2020-06-18 DIAGNOSIS — R55 Syncope and collapse: Secondary | ICD-10-CM | POA: Diagnosis not present

## 2020-06-18 DIAGNOSIS — Z4509 Encounter for adjustment and management of other cardiac device: Secondary | ICD-10-CM | POA: Diagnosis not present

## 2020-06-18 DIAGNOSIS — R002 Palpitations: Secondary | ICD-10-CM | POA: Diagnosis not present

## 2020-06-18 DIAGNOSIS — I471 Supraventricular tachycardia: Secondary | ICD-10-CM | POA: Diagnosis not present

## 2020-06-18 DIAGNOSIS — Z95818 Presence of other cardiac implants and grafts: Secondary | ICD-10-CM | POA: Diagnosis not present

## 2020-06-23 DIAGNOSIS — F411 Generalized anxiety disorder: Secondary | ICD-10-CM | POA: Diagnosis not present

## 2020-07-10 DIAGNOSIS — F411 Generalized anxiety disorder: Secondary | ICD-10-CM | POA: Diagnosis not present

## 2020-07-10 DIAGNOSIS — H5213 Myopia, bilateral: Secondary | ICD-10-CM | POA: Diagnosis not present

## 2020-07-13 DIAGNOSIS — G4733 Obstructive sleep apnea (adult) (pediatric): Secondary | ICD-10-CM | POA: Diagnosis not present

## 2020-07-29 DIAGNOSIS — Z45010 Encounter for checking and testing of cardiac pacemaker pulse generator [battery]: Secondary | ICD-10-CM | POA: Diagnosis not present

## 2020-07-29 DIAGNOSIS — R002 Palpitations: Secondary | ICD-10-CM | POA: Diagnosis not present

## 2020-07-29 DIAGNOSIS — Z4509 Encounter for adjustment and management of other cardiac device: Secondary | ICD-10-CM | POA: Diagnosis not present

## 2020-07-29 DIAGNOSIS — R55 Syncope and collapse: Secondary | ICD-10-CM | POA: Diagnosis not present

## 2020-08-06 DIAGNOSIS — F411 Generalized anxiety disorder: Secondary | ICD-10-CM | POA: Diagnosis not present

## 2020-08-27 DIAGNOSIS — F411 Generalized anxiety disorder: Secondary | ICD-10-CM | POA: Diagnosis not present

## 2020-08-31 DIAGNOSIS — Z4509 Encounter for adjustment and management of other cardiac device: Secondary | ICD-10-CM | POA: Diagnosis not present

## 2020-08-31 DIAGNOSIS — R55 Syncope and collapse: Secondary | ICD-10-CM | POA: Diagnosis not present

## 2020-08-31 DIAGNOSIS — I471 Supraventricular tachycardia: Secondary | ICD-10-CM | POA: Diagnosis not present

## 2020-08-31 DIAGNOSIS — R002 Palpitations: Secondary | ICD-10-CM | POA: Diagnosis not present

## 2020-09-21 DIAGNOSIS — Z6841 Body Mass Index (BMI) 40.0 and over, adult: Secondary | ICD-10-CM | POA: Diagnosis not present

## 2020-09-21 DIAGNOSIS — G4733 Obstructive sleep apnea (adult) (pediatric): Secondary | ICD-10-CM | POA: Diagnosis not present

## 2020-09-22 DIAGNOSIS — F411 Generalized anxiety disorder: Secondary | ICD-10-CM | POA: Diagnosis not present

## 2020-09-29 NOTE — Progress Notes (Signed)
Rito Ehrlich, am serving as a Education administrator for Dr. Lynne Leader.   Jacqueline Griffin is a 45 y.o. female who presents to [place] at [facility] today for continued R knee/leg pain. Pt was last seen by Dr. Georgina Snell on 04/09/20 and was given a R knee steroid injection. Today, pt reports Right knee is doing better but the Right calf is very tender and sore since Sunday and that pain radiates up the R leg hamstring. Patient states there is nothing she remembers that caused the pain. Patient states that the pain feels like a constant cramp and tense. Patient states that she is nervous about blood clot.  She denies any leg swelling.  No chest pain palpitations shortness of breath.  Additionally after discussion she notes that she has been struggling to lose weight and is interested with some help with weight loss.  She lives in [place] and would appreciate a weight loss clinic in [place] if possible.  Swelling: no Aggravates: laying  Treatments tried: no  Dx imaging: 04/09/20 R knee XR  02/05/18 R knee MRI  01/16/18 R knee XR  Pertinent review of systems: No fevers or chills  Relevant historical information: No history of DVT.  Obesity.   Exam:  BP 136/80 (BP Location: Left Arm, Patient Position: Sitting, Cuff Size: Large)   Pulse 80   Ht 5' 6"  (1.676 m)   Wt 266 lb (120.7 kg)   SpO2 99%   BMI 42.93 kg/m  General: Well Developed, well nourished, and in no acute distress.   MSK: L-spine nontender midline.  Normal lumbar motion. Lower extremity strength is intact. Positive right-sided slump test. Reflexes and sensation are intact distally.  Right leg equal calf diameter compared to left.  No palpable cords.  Calf is nontender.  No erythema.  No edema    Lab and Radiology Results  EXAM: RIGHT KNEE 3 VIEWS   COMPARISON:  Plain films right knee 01/16/2018.   FINDINGS: No acute bony or joint abnormality is identified. Bone-on-bone medial compartment joint  space narrowing and osteophytosis again seen. No joint effusion or chondrocalcinosis.   IMPRESSION: No acute abnormality.   Advanced medial compartment osteoarthritis.     Electronically Signed   By: Inge Rise M.D.   On: 04/09/2020 11:57   I, Lynne Leader, personally (independently) visualized and performed the interpretation of the images attached in this note.   Vascular ultrasound April 10, 2020 Summary:  RIGHT:  - There is no evidence of deep vein thrombosis in the lower extremity.  - There is no evidence of superficial venous thrombosis.     - No cystic structure found in the popliteal fossa.     LEFT:  - No evidence of common femoral vein obstruction.     *See table(s) above for measurements and observations.   Electronically signed by Servando Snare MD on 04/11/2020 at 11:07:52 PM.     Assessment and Plan: 45 y.o. female with right leg pain thought to be S1 radiculopathy.  Very unlikely to be DVT based on absence of leg swelling and other negative signs or symptoms for DVT. Discussed options.  Plan for short course of prednisone and trial gabapentin.  If not improving will add physical therapy and proceed to x-ray and ultimately MRI.  Patient will let me know.  If still concern for DVT will obtain nonvascular ultrasound.  Patient had a negative vascular ultrasound in January 2022  As for obesity and knee arthritis she is struggled  to lose weight on her own and would like some help.  After discussion recommend [place] [facility].  Provided phone number.  Happy to place referral if needed.   PDMP not reviewed this encounter. No orders of the defined types were placed in this encounter.  Meds ordered this encounter  Medications   predniSONE (DELTASONE) 50 MG tablet    Sig: Take 1 tablet (50 mg total) by mouth daily.    Dispense:  5 tablet    Refill:  0   gabapentin (NEURONTIN) 300 MG capsule    Sig: Take 1 capsule (300 mg total) by mouth  3 (three) times daily as needed.    Dispense:  90 capsule    Refill:  3     Discussed warning signs or symptoms. Please see discharge instructions. Patient expresses understanding.   The above documentation has been reviewed and is accurate and complete Lynne Leader, M.D.

## 2020-09-30 ENCOUNTER — Other Ambulatory Visit: Payer: Self-pay

## 2020-09-30 ENCOUNTER — Encounter: Payer: Self-pay | Admitting: Family Medicine

## 2020-09-30 ENCOUNTER — Ambulatory Visit (INDEPENDENT_AMBULATORY_CARE_PROVIDER_SITE_OTHER): Payer: BC Managed Care – PPO | Admitting: Family Medicine

## 2020-09-30 VITALS — BP 136/80 | HR 80 | Ht 66.0 in | Wt 266.0 lb

## 2020-09-30 DIAGNOSIS — M5431 Sciatica, right side: Secondary | ICD-10-CM | POA: Diagnosis not present

## 2020-09-30 MED ORDER — GABAPENTIN 300 MG PO CAPS: 300.0000 mg | ORAL_CAPSULE | Freq: Three times a day (TID) | ORAL | 3 refills | Status: DC | PRN

## 2020-09-30 MED ORDER — PREDNISONE 50 MG PO TABS: 50.0000 mg | ORAL_TABLET | Freq: Every day | ORAL | 0 refills | Status: DC

## 2020-09-30 NOTE — Patient Instructions (Addendum)
Thank you for coming in today.   Take that prednisone for 5 days.   Use the gabapentin as needed but mostly at bedtime.   If not better let me know. Next step is PT. After that next step is Xray and MRI.   If the leg start swelling or you are worried about a blood clot and want the ultrasound test let me know and I will order it.    [facility] Address: [address], [place], [place] [address] Phone: [phone]  Sciatica  Sciatica is pain, weakness, tingling, or loss of feeling (numbness) along the sciatic nerve. The sciatic nerve starts in the lower back and goes down the back of each leg. Sciatica usually goes away on its own or with treatment. Sometimes, sciatica may come back (recur). What are the causes? This condition happens when the sciatic nerve is pinched or has pressure put on it. This may be the result of: A disk in between the bones of the spine bulging out too far (herniated disk). Changes in the spinal disks that occur with aging. A condition that affects a muscle in the butt. Extra bone growth near the sciatic nerve. A break (fracture) of the area between your hip bones (pelvis). Pregnancy. Tumor. This is rare. What increases the risk? You are more likely to develop this condition if you: Play sports that put pressure or stress on the spine. Have poor strength and ease of movement (flexibility). Have had a back injury in the past. Have had back surgery. Sit for long periods of time. Do activities that involve bending or lifting over and over again. Are very overweight (obese). What are the signs or symptoms? Symptoms can vary from mild to very bad. They may include: Any of these problems in the lower back, leg, hip, or butt: Mild tingling, loss of feeling, or dull aches. Burning sensations. Sharp pains. Loss of feeling in the back of the calf or the sole of the foot. Leg weakness. Very bad back pain that makes it hard to  move. These symptoms may get worse when you cough, sneeze, or laugh. They may alsoget worse when you sit or stand for long periods of time. How is this treated? This condition often gets better without any treatment. However, treatment may include: Changing or cutting back on physical activity when you have pain. Doing exercises and stretching. Putting ice or heat on the affected area. Medicines that help: To relieve pain and swelling. To relax your muscles. Shots (injections) of medicines that help to relieve pain, irritation, and swelling. Surgery. Follow these instructions at home: Medicines Take over-the-counter and prescription medicines only as told by your doctor. Ask your doctor if the medicine prescribed to you: Requires you to avoid driving or using heavy machinery. Can cause trouble pooping (constipation). You may need to take these steps to prevent or treat trouble pooping: Drink enough fluids to keep your pee (urine) pale yellow. Take over-the-counter or prescription medicines. Eat foods that are high in fiber. These include beans, whole grains, and fresh fruits and vegetables. Limit foods that are high in fat and sugar. These include fried or sweet foods. Managing pain     If told, put ice on the affected area. Put ice in a plastic bag. Place a towel between your skin and the bag. Leave the ice on for 20 minutes, 2-3 times a day. If told, put heat on the affected area. Use the heat source that your doctor  tells you to use, such as a moist heat pack or a heating pad. Place a towel between your skin and the heat source. Leave the heat on for 20-30 minutes. Remove the heat if your skin turns bright red. This is very important if you are unable to feel pain, heat, or cold. You may have a greater risk of getting burned. Activity  Return to your normal activities as told by your doctor. Ask your doctor what activities are safe for you. Avoid activities that make your  symptoms worse. Take short rests during the day. When you rest for a long time, do some physical activity or stretching between periods of rest. Avoid sitting for a long time without moving. Get up and move around at least one time each hour. Exercise and stretch regularly, as told by your doctor. Do not lift anything that is heavier than 10 lb (4.5 kg) while you have symptoms of sciatica. Avoid lifting heavy things even when you do not have symptoms. Avoid lifting heavy things over and over. When you lift objects, always lift in a way that is safe for your body. To do this, you should: Bend your knees. Keep the object close to your body. Avoid twisting.  General instructions Stay at a healthy weight. Wear comfortable shoes that support your feet. Avoid wearing high heels. Avoid sleeping on a mattress that is too soft or too hard. You might have less pain if you sleep on a mattress that is firm enough to support your back. Keep all follow-up visits as told by your doctor. This is important. Contact a doctor if: You have pain that: Wakes you up when you are sleeping. Gets worse when you lie down. Is worse than the pain you have had in the past. Lasts longer than 4 weeks. You lose weight without trying. Get help right away if: You cannot control when you pee (urinate) or poop (have a bowel movement). You have weakness in any of these areas and it gets worse: Lower back. The area between your hip bones. Butt. Legs. You have redness or swelling of your back. You have a burning feeling when you pee. Summary Sciatica is pain, weakness, tingling, or loss of feeling (numbness) along the sciatic nerve. This condition happens when the sciatic nerve is pinched or has pressure put on it. Sciatica can cause pain, tingling, or loss of feeling (numbness) in the lower back, legs, hips, and butt. Treatment often includes rest, exercise, medicines, and putting ice or heat on the affected  area. This information is not intended to replace advice given to you by your health care provider. Make sure you discuss any questions you have with your healthcare provider. Document Revised: 04/02/2018 Document Reviewed: 04/02/2018 Elsevier Patient Education  [place].

## 2020-10-01 DIAGNOSIS — R55 Syncope and collapse: Secondary | ICD-10-CM | POA: Diagnosis not present

## 2020-10-01 DIAGNOSIS — Z4509 Encounter for adjustment and management of other cardiac device: Secondary | ICD-10-CM | POA: Diagnosis not present

## 2020-10-01 DIAGNOSIS — R002 Palpitations: Secondary | ICD-10-CM | POA: Diagnosis not present

## 2020-10-06 ENCOUNTER — Other Ambulatory Visit: Payer: Self-pay

## 2020-10-06 ENCOUNTER — Encounter (INDEPENDENT_AMBULATORY_CARE_PROVIDER_SITE_OTHER): Payer: Self-pay | Admitting: Bariatrics

## 2020-10-06 ENCOUNTER — Ambulatory Visit (INDEPENDENT_AMBULATORY_CARE_PROVIDER_SITE_OTHER): Payer: BC Managed Care – PPO | Admitting: Bariatrics

## 2020-10-06 VITALS — BP 123/85 | HR 73 | Temp 98.0°F | Ht 66.0 in | Wt 263.0 lb

## 2020-10-06 DIAGNOSIS — R0602 Shortness of breath: Secondary | ICD-10-CM | POA: Diagnosis not present

## 2020-10-06 DIAGNOSIS — D508 Other iron deficiency anemias: Secondary | ICD-10-CM | POA: Diagnosis not present

## 2020-10-06 DIAGNOSIS — Z1331 Encounter for screening for depression: Secondary | ICD-10-CM

## 2020-10-06 DIAGNOSIS — G4733 Obstructive sleep apnea (adult) (pediatric): Secondary | ICD-10-CM

## 2020-10-06 DIAGNOSIS — R6889 Other general symptoms and signs: Secondary | ICD-10-CM | POA: Diagnosis not present

## 2020-10-06 DIAGNOSIS — R5383 Other fatigue: Secondary | ICD-10-CM

## 2020-10-06 DIAGNOSIS — K7581 Nonalcoholic steatohepatitis (NASH): Secondary | ICD-10-CM | POA: Diagnosis not present

## 2020-10-06 DIAGNOSIS — Z6841 Body Mass Index (BMI) 40.0 and over, adult: Secondary | ICD-10-CM

## 2020-10-06 DIAGNOSIS — I10 Essential (primary) hypertension: Secondary | ICD-10-CM | POA: Diagnosis not present

## 2020-10-06 DIAGNOSIS — R7309 Other abnormal glucose: Secondary | ICD-10-CM

## 2020-10-06 DIAGNOSIS — Z9989 Dependence on other enabling machines and devices: Secondary | ICD-10-CM

## 2020-10-06 DIAGNOSIS — Z9189 Other specified personal risk factors, not elsewhere classified: Secondary | ICD-10-CM | POA: Diagnosis not present

## 2020-10-06 DIAGNOSIS — E559 Vitamin D deficiency, unspecified: Secondary | ICD-10-CM | POA: Diagnosis not present

## 2020-10-06 DIAGNOSIS — Z0000 Encounter for general adult medical examination without abnormal findings: Secondary | ICD-10-CM | POA: Diagnosis not present

## 2020-10-06 DIAGNOSIS — Z0289 Encounter for other administrative examinations: Secondary | ICD-10-CM

## 2020-10-07 ENCOUNTER — Encounter (INDEPENDENT_AMBULATORY_CARE_PROVIDER_SITE_OTHER): Payer: Self-pay | Admitting: Bariatrics

## 2020-10-07 DIAGNOSIS — D509 Iron deficiency anemia, unspecified: Secondary | ICD-10-CM | POA: Insufficient documentation

## 2020-10-07 DIAGNOSIS — E559 Vitamin D deficiency, unspecified: Secondary | ICD-10-CM | POA: Insufficient documentation

## 2020-10-07 LAB — LIPID PANEL WITH LDL/HDL RATIO
Cholesterol, Total: 174 mg/dL (ref 100–199)
HDL: 44 mg/dL (ref >39)
LDL Chol Calc (NIH): 112 mg/dL — ABNORMAL HIGH (ref 0–99)
LDL/HDL Ratio: 2.5 ratio (ref 0.0–3.2)
Triglycerides: 98 mg/dL (ref 0–149)
VLDL Cholesterol Cal: 18 mg/dL (ref 5–40)

## 2020-10-07 LAB — CBC WITH DIFFERENTIAL/PLATELET
Basophils Absolute: 0 10*3/uL (ref 0.0–0.2)
Basos: 0 %
EOS (ABSOLUTE): 0.2 10*3/uL (ref 0.0–0.4)
Eos: 2 %
Hematocrit: 37 % (ref 34.0–46.6)
Hemoglobin: 10.3 g/dL — ABNORMAL LOW (ref 11.1–15.9)
Immature Grans (Abs): 0 10*3/uL (ref 0.0–0.1)
Immature Granulocytes: 0 %
Lymphocytes Absolute: 2.1 10*3/uL (ref 0.7–3.1)
Lymphs: 19 %
MCH: 20.6 pg — ABNORMAL LOW (ref 26.6–33.0)
MCHC: 27.8 g/dL — ABNORMAL LOW (ref 31.5–35.7)
MCV: 74 fL — ABNORMAL LOW (ref 79–97)
Monocytes Absolute: 0.6 10*3/uL (ref 0.1–0.9)
Monocytes: 6 %
Neutrophils Absolute: 8.3 10*3/uL — ABNORMAL HIGH (ref 1.4–7.0)
Neutrophils: 73 %
Platelets: 379 10*3/uL (ref 150–450)
RBC: 4.99 x10E6/uL (ref 3.77–5.28)
RDW: 16.9 % — ABNORMAL HIGH (ref 11.7–15.4)
WBC: 11.3 10*3/uL — ABNORMAL HIGH (ref 3.4–10.8)

## 2020-10-07 LAB — TSH: TSH: 1.49 u[IU]/mL (ref 0.450–4.500)

## 2020-10-07 LAB — COMPREHENSIVE METABOLIC PANEL
ALT: 8 IU/L (ref 0–32)
AST: 13 IU/L (ref 0–40)
Albumin/Globulin Ratio: 1.1 — ABNORMAL LOW (ref 1.2–2.2)
Albumin: 4 g/dL (ref 3.8–4.8)
Alkaline Phosphatase: 91 IU/L (ref 44–121)
BUN/Creatinine Ratio: 20 (ref 9–23)
BUN: 10 mg/dL (ref 6–24)
Bilirubin Total: 0.6 mg/dL (ref 0.0–1.2)
CO2: 21 mmol/L (ref 20–29)
Calcium: 8.9 mg/dL (ref 8.7–10.2)
Chloride: 101 mmol/L (ref 96–106)
Creatinine, Ser: 0.5 mg/dL — ABNORMAL LOW (ref 0.57–1.00)
Globulin, Total: 3.7 g/dL (ref 1.5–4.5)
Glucose: 83 mg/dL (ref 65–99)
Potassium: 4.2 mmol/L (ref 3.5–5.2)
Sodium: 136 mmol/L (ref 134–144)
Total Protein: 7.7 g/dL (ref 6.0–8.5)
eGFR: 119 mL/min/{1.73_m2} (ref >59)

## 2020-10-07 LAB — INSULIN, RANDOM: INSULIN: 22 u[IU]/mL (ref 2.6–24.9)

## 2020-10-07 LAB — VITAMIN D 25 HYDROXY (VIT D DEFICIENCY, FRACTURES): Vit D, 25-Hydroxy: 16 ng/mL — ABNORMAL LOW (ref 30.0–100.0)

## 2020-10-07 LAB — HEMOGLOBIN A1C
Est. average glucose Bld gHb Est-mCnc: 111 mg/dL
Hgb A1c MFr Bld: 5.5 % (ref 4.8–5.6)

## 2020-10-07 LAB — T4, FREE: Free T4: 1.12 ng/dL (ref 0.82–1.77)

## 2020-10-07 LAB — T3: T3, Total: 146 ng/dL (ref 71–180)

## 2020-10-08 ENCOUNTER — Encounter (INDEPENDENT_AMBULATORY_CARE_PROVIDER_SITE_OTHER): Payer: Self-pay | Admitting: Bariatrics

## 2020-10-08 DIAGNOSIS — F411 Generalized anxiety disorder: Secondary | ICD-10-CM | POA: Diagnosis not present

## 2020-10-08 NOTE — Progress Notes (Signed)
Chief Complaint:   OBESITY Jacqueline Griffin (MR# [id number]) is a 45 y.o. female who presents for evaluation and treatment of obesity and related comorbidities. Current BMI is Body mass index is 42.45 kg/m. Jacqueline Griffin has been struggling with her weight for many years and has been unsuccessful in either losing weight, maintaining weight loss, or reaching her healthy weight goal.  Jacqueline Griffin is currently in the action stage of change and ready to dedicate time achieving and maintaining a healthier weight. Jacqueline Griffin is interested in becoming our patient and working on intensive lifestyle modifications including (but not limited to) diet and exercise for weight loss.  Jacqueline Griffin does like to cook but notes time as an obstacle. She craves carbohydrates.  Jacqueline Griffin's habits were reviewed today and are as follows: her desired weight loss is 43 lbs, she started gaining weight when she was 45 years old and her mother passed, her heaviest weight ever was 267 pounds, she has significant food cravings issues, she skips meals frequently, and she struggles with emotional eating.  Depression Screen Jacqueline Griffin's Food and Mood (modified PHQ-9) score was 16.  Depression screen PHQ 2/9 10/06/2020  Decreased Interest 1  Down, Depressed, Hopeless 1  PHQ - 2 Score 2  Altered sleeping 3  Tired, decreased energy 3  Change in appetite 3  Feeling bad or failure about yourself  3  Trouble concentrating 2  Moving slowly or fidgety/restless 0  Suicidal thoughts 0  PHQ-9 Score 16  Difficult doing work/chores Not difficult at all   Subjective:   1. Other fatigue [name] admits to daytime somnolence and admits to waking up still tired. Patent has a history of symptoms of daytime fatigue. Jacqueline Griffin generally gets 7 or 8 hours of sleep per night, and states that she has generally restful sleep. Snoring is present. Apneic episodes are present. Epworth Sleepiness Score is 2.  2. SOB (shortness of breath) on exertion Jacqueline Griffin  notes increasing shortness of breath with exercising and seems to be worsening over time with weight gain. She notes getting out of breath sooner with activity than she used to. This has gotten worse recently. Jacqueline Griffin denies shortness of breath at rest or orthopnea.  3. Essential hypertension Jacqueline Griffin's BP is controlled.  4. OSA on CPAP She wears a CPAP.  5. NASH (nonalcoholic steatohepatitis) Diagnosed via ultrasound in 2012.  6. Other iron deficiency anemia Anemia due to cycles.  7. Vitamin D deficiency Jacqueline Griffin is not on a Vit D supplement.  8. Elevated glucose Jacqueline Griffin has a history of elevated glucose.  9. At risk for activity intolerance Jacqueline Griffin is at risk for exercise intolerance due to fatigue and SOB.  Assessment/Plan:   1. Other fatigue Jacqueline Griffin does feel that her weight is causing her energy to be lower than it should be. Fatigue may be related to obesity, depression or many other causes. Labs will be ordered, and in the meanwhile, Ever will focus on self care including making healthy food choices, increasing physical activity and focusing on stress reduction. Check labs today.  - EKG 12-Lead - CBC with Differential/Platelet - Comprehensive metabolic panel - Hemoglobin A1c - Insulin, random - Lipid Panel With LDL/HDL Ratio - T3 - T4, free - TSH - VITAMIN D 25 Hydroxy (Vit-D Deficiency, Fractures)  2. SOB (shortness of breath) on exertion Jacqueline Griffin does feel that she gets out of breath more easily that she used to when she exercises. Jacqueline Griffin's shortness of breath appears to be obesity related and exercise induced. She has  agreed to work on weight loss and gradually increase exercise to treat her exercise induced shortness of breath. Will continue to monitor closely. Check labs today.  - Lipid Panel With LDL/HDL Ratio  3. Essential hypertension Jacqueline Griffin is working on healthy weight loss and exercise to improve blood pressure control. We will watch for signs of  hypotension as she continues her lifestyle modifications.  4. OSA on CPAP Intensive lifestyle modifications are the first line treatment for this issue. We discussed several lifestyle modifications today and she will continue to work on diet, exercise and weight loss efforts. We will continue to monitor. Orders and follow up as documented in patient record. Pt will continue to wear CPAP.  5. NASH (nonalcoholic steatohepatitis) We discussed the likely diagnosis of non-alcoholic fatty liver disease today and how this condition is obesity related. Jacqueline Griffin was educated the importance of weight loss. Jacqueline Griffin agreed to continue with her weight loss efforts with healthier diet and exercise as an essential part of her treatment plan. Check labs today.  - Comprehensive metabolic panel - Lipid Panel With LDL/HDL Ratio  6. Other iron deficiency anemia Check labs today.  - CBC with Differential/Platelet  7. Vitamin D deficiency Low Vitamin D level contributes to fatigue and are associated with obesity, breast, and colon cancer. She agrees to follow-up for routine testing of Vitamin D, at least 2-3 times per year to avoid over-replacement. Check labs today.  - VITAMIN D 25 Hydroxy (Vit-D Deficiency, Fractures)  8. Elevated glucose Fasting labs will be obtained and results with be discussed with Jacqueline Griffin in 2 weeks at her follow up visit. In the meanwhile Jacqueline Griffin was started on a lower simple carbohydrate diet and will work on weight loss efforts. Check labs today.  - Hemoglobin A1c - Insulin, random  9. Depression screen Jacqueline Griffin had a positive depression screening. Depression is commonly associated with obesity and often results in emotional eating behaviors. We will monitor this closely and work on CBT to help improve the non-hunger eating patterns. Referral to Psychology may be required if no improvement is seen as she continues in our clinic.  10. At risk for activity intolerance Jacqueline Griffin was  given approximately 15 minutes of exercise intolerance counseling today. She is 45 y.o. female and has risk factors exercise intolerance including obesity. We discussed intensive lifestyle modifications today with an emphasis on specific weight loss instructions and strategies. Raelene will slowly increase activity as tolerated.  Repetitive spaced learning was employed today to elicit superior memory formation and behavioral change.   11. Class 3 severe obesity with serious comorbidity and body mass index (BMI) of 40.0 to 44.9 in adult, unspecified obesity type ([place])  Valynn is currently in the action stage of change and her goal is to continue with weight loss efforts. I recommend Uri begin the structured treatment plan as follows:  She has agreed to the Category 3 Plan.  Meal plan Intentional eating Will minimize meal sizes.  Exercise goals: No exercise has been prescribed at this time.   Behavioral modification strategies: increasing lean protein intake, decreasing simple carbohydrates, increasing vegetables, increasing water intake, decreasing eating out, no skipping meals, meal planning and cooking strategies, keeping healthy foods in the home, and planning for success.  She was informed of the importance of frequent follow-up visits to maximize her success with intensive lifestyle modifications for her multiple health conditions. She was informed we would discuss her lab results at her next visit unless there is a critical issue that needs to  be addressed sooner. Charina agreed to keep her next visit at the agreed upon time to discuss these results.  Objective:   Blood pressure 123/85, pulse 73, temperature 98 F (36.7 C), height 5' 6"  (1.676 m), weight 263 lb (119.3 kg), last menstrual period 09/16/2020, SpO2 98 %. Body mass index is 42.45 kg/m.  EKG: Normal sinus rhythm, rate 69.  Indirect Calorimeter completed today shows a VO2 of 324 and a REE of 2257.  Her calculated basal  metabolic rate is 0354 thus her basal metabolic rate is better than expected.  General: Cooperative, alert, well developed, in no acute distress. HEENT: Conjunctivae and lids unremarkable. Cardiovascular: Regular rhythm.  Lungs: Normal work of breathing. Neurologic: No focal deficits.   Lab Results  Component Value Date   CREATININE 0.50 (L) 10/06/2020   BUN 10 10/06/2020   NA 136 10/06/2020   K 4.2 10/06/2020   CL 101 10/06/2020   CO2 21 10/06/2020   Lab Results  Component Value Date   ALT 8 10/06/2020   AST 13 10/06/2020   ALKPHOS 91 10/06/2020   BILITOT 0.6 10/06/2020   Lab Results  Component Value Date   HGBA1C 5.5 10/06/2020   Lab Results  Component Value Date   INSULIN 22.0 10/06/2020   Lab Results  Component Value Date   TSH 1.490 10/06/2020   Lab Results  Component Value Date   CHOL 174 10/06/2020   HDL 44 10/06/2020   LDLCALC 112 (H) 10/06/2020   TRIG 98 10/06/2020   Lab Results  Component Value Date   WBC 11.3 (H) 10/06/2020   HGB 10.3 (L) 10/06/2020   HCT 37.0 10/06/2020   MCV 74 (L) 10/06/2020   PLT 379 10/06/2020    Attestation Statements:   Reviewed by clinician on day of visit: allergies, medications, problem list, medical history, surgical history, family history, social history, and previous encounter notes.  Coral Ceo, CMA, am acting as Location manager for [employer], DO.  I have reviewed the above documentation for accuracy and completeness, and I agree with the above. Jearld Lesch, DO

## 2020-10-14 DIAGNOSIS — F411 Generalized anxiety disorder: Secondary | ICD-10-CM | POA: Diagnosis not present

## 2020-10-20 ENCOUNTER — Encounter (INDEPENDENT_AMBULATORY_CARE_PROVIDER_SITE_OTHER): Payer: Self-pay | Admitting: Bariatrics

## 2020-10-20 ENCOUNTER — Other Ambulatory Visit: Payer: Self-pay

## 2020-10-20 ENCOUNTER — Ambulatory Visit (INDEPENDENT_AMBULATORY_CARE_PROVIDER_SITE_OTHER): Payer: BC Managed Care – PPO | Admitting: Bariatrics

## 2020-10-20 VITALS — BP 138/83 | HR 72 | Temp 98.0°F | Ht 66.0 in | Wt 256.0 lb

## 2020-10-20 DIAGNOSIS — D508 Other iron deficiency anemias: Secondary | ICD-10-CM

## 2020-10-20 DIAGNOSIS — E8881 Metabolic syndrome: Secondary | ICD-10-CM

## 2020-10-20 DIAGNOSIS — Z9189 Other specified personal risk factors, not elsewhere classified: Secondary | ICD-10-CM | POA: Diagnosis not present

## 2020-10-20 DIAGNOSIS — Z6841 Body Mass Index (BMI) 40.0 and over, adult: Secondary | ICD-10-CM

## 2020-10-20 DIAGNOSIS — E559 Vitamin D deficiency, unspecified: Secondary | ICD-10-CM | POA: Diagnosis not present

## 2020-10-20 MED ORDER — VITAMIN D (ERGOCALCIFEROL) 1.25 MG (50000 UNIT) PO CAPS
50000.0000 [IU] | ORAL_CAPSULE | ORAL | 0 refills | Status: DC
Start: 2020-10-20 — End: 2020-12-01

## 2020-10-20 MED ORDER — POLYSACCHARIDE IRON COMPLEX 150 MG PO CAPS: 150.0000 mg | ORAL_CAPSULE | Freq: Every day | ORAL | 0 refills | Status: DC

## 2020-10-21 DIAGNOSIS — I493 Ventricular premature depolarization: Secondary | ICD-10-CM | POA: Diagnosis not present

## 2020-10-21 DIAGNOSIS — Z79899 Other long term (current) drug therapy: Secondary | ICD-10-CM | POA: Diagnosis not present

## 2020-10-21 DIAGNOSIS — I498 Other specified cardiac arrhythmias: Secondary | ICD-10-CM | POA: Diagnosis not present

## 2020-10-21 DIAGNOSIS — R55 Syncope and collapse: Secondary | ICD-10-CM | POA: Diagnosis not present

## 2020-10-21 DIAGNOSIS — I471 Supraventricular tachycardia: Secondary | ICD-10-CM | POA: Diagnosis not present

## 2020-10-21 DIAGNOSIS — F419 Anxiety disorder, unspecified: Secondary | ICD-10-CM | POA: Diagnosis not present

## 2020-10-25 ENCOUNTER — Encounter (INDEPENDENT_AMBULATORY_CARE_PROVIDER_SITE_OTHER): Payer: Self-pay | Admitting: Bariatrics

## 2020-10-25 NOTE — Progress Notes (Signed)
Chief Complaint:   OBESITY Samantha is here to discuss her progress with her obesity treatment plan along with follow-up of her obesity related diagnoses. Haelee is on the Category 3 Plan and states she is following her eating plan approximately 95% of the time. Adellyn states she is not currently exercising.  Today's visit was #: 2 Starting weight: 263 lbs Starting date: 10/06/2020 Today's weight: 256 Today's date: 10/20/2020 Total lbs lost to date: 7 lbs Total lbs lost since last in-office visit: 7 lbs  Interim History: [place] is down 7 lbs since her last visit. She missed some foods. She is drinking water and getting adequate protein.  Subjective:   1. Vitamin D deficiency She is not currently taking a vitamin D supplement. Lab Results  Component Value Date   VD25OH 16.0 (L) 10/06/2020   2. Insulin resistance Mckaylie has a diagnosis of insulin resistance based on her elevated fasting insulin level >5. She continues to work on diet and exercise to decrease her risk of diabetes. She is not currently taking any medication.  Lab Results  Component Value Date   INSULIN 22.0 10/06/2020   Lab Results  Component Value Date   HGBA1C 5.5 10/06/2020   3. Other iron deficiency anemia Scottlyn reports that her cycles are heavy.   CBC Latest Ref Rng & Units 10/06/2020 04/27/2018 08/14/2015  WBC 3.4 - 10.8 x10E3/uL 11.3(H) 11.3(H) 11.9(H)  Hemoglobin 11.1 - 15.9 g/dL 10.3(L) 12.0 11.8(L)  Hematocrit 34.0 - 46.6 % 37.0 38.3 36.2  Platelets 150 - 450 x10E3/uL 379 372 373   No results found for: IRON, TIBC, FERRITIN No results found for: VITAMINB12   4. At risk for osteoporosis Monica is at higher risk of osteopenia and osteoporosis due to Vitamin D deficiency.    Assessment/Plan:   1. Vitamin D deficiency Low Vitamin D level contributes to fatigue and are associated with obesity, breast, and colon cancer. She agrees to continue to take prescription Vitamin D @50 ,000 IU every  week and will follow-up for routine testing of Vitamin D, at least 2-3 times per year to avoid over-replacement.  - Vitamin D, Ergocalciferol, (DRISDOL) 1.25 MG (50000 UNIT) CAPS capsule; Take 1 capsule (50,000 Units total) by mouth every 7 (seven) days.  Dispense: 4 capsule; Refill: 0  2. Insulin resistance Sequoyah will continue to work on weight loss, exercise, and decreasing simple carbohydrates to help decrease the risk of diabetes. Tanganika agreed to follow-up with [place] as directed to closely monitor her progress. She will decrease carbohydrates and increase activities/exercising.  3. Other iron deficiency anemia - NU-IRON 150 MG one tab by mouth daily, #30, no refills.  4. At risk for osteoporosis Creola was given approximately 15 minutes of osteoporosis prevention counseling today. Kegan is at risk for osteopenia and osteoporosis due to her Vitamin D deficiency. She was encouraged to take her Vitamin D and follow her higher calcium diet and increase strengthening exercise to help strengthen her bones and decrease  risk of osteopenia and osteoporosis.  Repetitive spaced learning was employed today to elicit superior memory formation and behavioral change.   5. Obesity, current BMI 41.1 Shelly is currently in the action stage of change. As such, her goal is to continue with weight loss efforts. She has agreed to the Category 3 Plan.   Meal planning Intentional eating Reviewed labs from 10/06/2020 (CMP,Lipid panel, Vit D, CBC, A1C, thyroid panel) "[employer] the [employer]" handout given to [employer]  Exercise goals: As is  Scientist, water quality modification  strategies: increasing lean protein intake, decreasing simple carbohydrates, increasing vegetables, increasing water intake, decreasing eating out, no skipping meals, meal planning and cooking strategies, keeping healthy foods in the home, and planning for success.  Rikki has agreed to follow-up with our clinic in 2 weeks. She was informed of the  importance of frequent follow-up visits to maximize her success with intensive lifestyle modifications for her multiple health conditions.   Objective:   Blood pressure 138/83, pulse 72, temperature 98 F (36.7 C), height 5' 6"  (1.676 m), weight 256 lb (116.1 kg), SpO2 99 %. Body mass index is 41.32 kg/m.  General: Cooperative, alert, well developed, in no acute distress. HEENT: Conjunctivae and lids unremarkable. Cardiovascular: Regular rhythm.  Lungs: Normal work of breathing. Neurologic: No focal deficits.   Lab Results  Component Value Date   CREATININE 0.50 (L) 10/06/2020   BUN 10 10/06/2020   NA 136 10/06/2020   K 4.2 10/06/2020   CL 101 10/06/2020   CO2 21 10/06/2020   Lab Results  Component Value Date   ALT 8 10/06/2020   AST 13 10/06/2020   ALKPHOS 91 10/06/2020   BILITOT 0.6 10/06/2020   Lab Results  Component Value Date   HGBA1C 5.5 10/06/2020   Lab Results  Component Value Date   INSULIN 22.0 10/06/2020   Lab Results  Component Value Date   TSH 1.490 10/06/2020   Lab Results  Component Value Date   CHOL 174 10/06/2020   HDL 44 10/06/2020   LDLCALC 112 (H) 10/06/2020   TRIG 98 10/06/2020   Lab Results  Component Value Date   VD25OH 16.0 (L) 10/06/2020   Lab Results  Component Value Date   WBC 11.3 (H) 10/06/2020   HGB 10.3 (L) 10/06/2020   HCT 37.0 10/06/2020   MCV 74 (L) 10/06/2020   PLT 379 10/06/2020   No results found for: IRON, TIBC, FERRITIN  Attestation Statements:   Reviewed by clinician on day of visit: allergies, medications, problem list, medical history, surgical history, family history, social history, and previous encounter notes.  ILennette Bihari, CMA, am acting as transcriptionist for Dr. Jearld Lesch, DO.  I have reviewed the above documentation for accuracy and completeness, and I agree with the above. Jearld Lesch, DO

## 2020-11-02 ENCOUNTER — Encounter (INDEPENDENT_AMBULATORY_CARE_PROVIDER_SITE_OTHER): Payer: Self-pay

## 2020-11-02 DIAGNOSIS — Z4509 Encounter for adjustment and management of other cardiac device: Secondary | ICD-10-CM | POA: Diagnosis not present

## 2020-11-02 DIAGNOSIS — R002 Palpitations: Secondary | ICD-10-CM | POA: Diagnosis not present

## 2020-11-02 DIAGNOSIS — R55 Syncope and collapse: Secondary | ICD-10-CM | POA: Diagnosis not present

## 2020-11-02 DIAGNOSIS — I493 Ventricular premature depolarization: Secondary | ICD-10-CM | POA: Diagnosis not present

## 2020-11-03 ENCOUNTER — Other Ambulatory Visit: Payer: Self-pay

## 2020-11-03 ENCOUNTER — Encounter (INDEPENDENT_AMBULATORY_CARE_PROVIDER_SITE_OTHER): Payer: Self-pay | Admitting: Bariatrics

## 2020-11-03 ENCOUNTER — Ambulatory Visit (INDEPENDENT_AMBULATORY_CARE_PROVIDER_SITE_OTHER): Payer: BC Managed Care – PPO | Admitting: Bariatrics

## 2020-11-03 VITALS — BP 129/76 | HR 75 | Temp 97.3°F | Ht 66.0 in | Wt 258.0 lb

## 2020-11-03 DIAGNOSIS — I10 Essential (primary) hypertension: Secondary | ICD-10-CM

## 2020-11-03 DIAGNOSIS — E8881 Metabolic syndrome: Secondary | ICD-10-CM | POA: Diagnosis not present

## 2020-11-03 DIAGNOSIS — K5909 Other constipation: Secondary | ICD-10-CM

## 2020-11-03 DIAGNOSIS — Z6841 Body Mass Index (BMI) 40.0 and over, adult: Secondary | ICD-10-CM

## 2020-11-04 ENCOUNTER — Encounter (INDEPENDENT_AMBULATORY_CARE_PROVIDER_SITE_OTHER): Payer: Self-pay | Admitting: Bariatrics

## 2020-11-04 NOTE — Progress Notes (Signed)
Chief Complaint:   OBESITY Jacqueline Griffin is here to discuss her progress with her obesity treatment plan along with follow-up of her obesity related diagnoses. Jacqueline Griffin is on the Category 3 Plan and states she is following her eating plan approximately 75% of the time. Jacqueline Griffin states she is doing 0 minutes 0 times per week.  Today's visit was #: 3 Starting weight: 263 lbs Starting date: 10/06/2020 Today's weight: 258 lbs Today's date: 11/03/2020 Total lbs lost to date: 5 lbs Total lbs lost since last in-office visit: 0  Interim History: Jacqueline Griffin is up 2 lbs since her last visit. She is up 2 lbs of water bioimpedance.  Subjective:   1. Essential hypertension Jacqueline Griffin is taking Toprol XL.  2. Insulin resistance Jacqueline Griffin is not on medications for Insulin resistanace.   3. Other constipation Jacqueline Griffin notes constipation.     Assessment/Plan:   1. Essential hypertension Jacqueline Griffin will continue taking her medications. She is working on healthy weight loss and exercise to improve blood pressure control. We will watch for signs of hypotension as she continues her lifestyle modifications.   2. Insulin resistance Jacqueline Griffin will increase healthy fats and protein. She will decrease carbohydrates. She will continue to work on weight loss, exercise, and decreasing simple carbohydrates to help decrease the risk of diabetes. Jacqueline Griffin agreed to follow-up with [place] as directed to closely monitor her progress.   3. Other constipation Jacqueline Griffin will start the Miralax, rectal suppositories if needed and  Doculax.  She will increase protein. She will begin suppositories use as needed. She was informed that a decrease in bowel movement frequency is normal while losing weight, but stools should not be hard or painful. Orders and follow up as documented in patient record.   Counseling Getting to Good Bowel Health: Your goal is to have one soft bowel movement each day. Drink at least 8 glasses of water each day. Eat  plenty of fiber (goal is over 25 grams each day). It is best to get most of your fiber from dietary sources which includes leafy green vegetables, fresh fruit, and whole grains. You may need to add fiber with the help of OTC fiber supplements. These include Metamucil, Citrucel, and Flaxseed. If you are still having trouble, try adding Miralax or Magnesium Citrate. If all of these changes do not work, Cabin crew.   4. Obesity, current BMI 41.6 Jacqueline Griffin is currently in the action stage of change. As such, her goal is to continue with weight loss efforts. She has agreed to the Category 3 Plan.   Jacqueline Griffin will continue meal planning. She will continue with intentional eating. She will get back to eating about 90%  of the plan.  Exercise goals: No exercise has been prescribed at this time.  Behavioral modification strategies: increasing lean protein intake, decreasing simple carbohydrates, increasing vegetables, increasing water intake, decreasing eating out, no skipping meals, meal planning and cooking strategies, keeping healthy foods in the home, and planning for success.  Jacqueline Griffin has agreed to follow-up with our clinic in 2 weeks. She was informed of the importance of frequent follow-up visits to maximize her success with intensive lifestyle modifications for her multiple health conditions.   Objective:   Blood pressure 129/76, pulse 75, temperature (!) 97.3 F (36.3 C), height 5' 6"  (1.676 m), weight 258 lb (117 kg), SpO2 99 %. Body mass index is 41.64 kg/m.  General: Cooperative, alert, well developed, in no acute distress. HEENT: Conjunctivae and lids unremarkable. Cardiovascular: Regular rhythm.  Lungs: Normal work of breathing. Neurologic: No focal deficits.   Lab Results  Component Value Date   CREATININE 0.50 (L) 10/06/2020   BUN 10 10/06/2020   NA 136 10/06/2020   K 4.2 10/06/2020   CL 101 10/06/2020   CO2 21 10/06/2020   Lab Results  Component Value Date   ALT 8  10/06/2020   AST 13 10/06/2020   ALKPHOS 91 10/06/2020   BILITOT 0.6 10/06/2020   Lab Results  Component Value Date   HGBA1C 5.5 10/06/2020   Lab Results  Component Value Date   INSULIN 22.0 10/06/2020   Lab Results  Component Value Date   TSH 1.490 10/06/2020   Lab Results  Component Value Date   CHOL 174 10/06/2020   HDL 44 10/06/2020   LDLCALC 112 (H) 10/06/2020   TRIG 98 10/06/2020   Lab Results  Component Value Date   VD25OH 16.0 (L) 10/06/2020   Lab Results  Component Value Date   WBC 11.3 (H) 10/06/2020   HGB 10.3 (L) 10/06/2020   HCT 37.0 10/06/2020   MCV 74 (L) 10/06/2020   PLT 379 10/06/2020   No results found for: IRON, TIBC, FERRITIN  Attestation Statements:   Reviewed by clinician on day of visit: allergies, medications, problem list, medical history, surgical history, family history, social history, and previous encounter notes.  Time spent on visit including pre-visit chart review and post-visit care and charting was 20 minutes.   I, Lizbeth Bark, RMA, am acting as Location manager for [employer], DO.   I have reviewed the above documentation for accuracy and completeness, and I agree with the above. Jearld Lesch, DO

## 2020-11-17 ENCOUNTER — Ambulatory Visit (INDEPENDENT_AMBULATORY_CARE_PROVIDER_SITE_OTHER): Payer: BC Managed Care – PPO | Admitting: Bariatrics

## 2020-11-17 ENCOUNTER — Other Ambulatory Visit: Payer: Self-pay

## 2020-11-17 ENCOUNTER — Encounter (INDEPENDENT_AMBULATORY_CARE_PROVIDER_SITE_OTHER): Payer: Self-pay | Admitting: Bariatrics

## 2020-11-17 VITALS — BP 118/62 | HR 76 | Temp 98.5°F | Ht 66.0 in | Wt 258.0 lb

## 2020-11-17 DIAGNOSIS — E559 Vitamin D deficiency, unspecified: Secondary | ICD-10-CM

## 2020-11-17 DIAGNOSIS — I10 Essential (primary) hypertension: Secondary | ICD-10-CM | POA: Diagnosis not present

## 2020-11-17 DIAGNOSIS — Z6841 Body Mass Index (BMI) 40.0 and over, adult: Secondary | ICD-10-CM | POA: Diagnosis not present

## 2020-11-18 NOTE — Progress Notes (Signed)
Chief Complaint:   OBESITY Jacqueline Griffin is here to discuss her progress with her obesity treatment plan along with follow-up of her obesity related diagnoses. Jacqueline Griffin is on the Category 3 Plan and states she is following her eating plan approximately 50% of the time. Jacqueline Griffin states she is doing 0 minutes 0 times per week.  Today's visit was #: 3 Starting weight: 263 lbs Starting date: 10/06/2020 Today's weight: 258 lbs Today's date: 11/17/2020 Total lbs lost to date: 5 lbs Total lbs lost since last in-office visit: 0  Interim History: Jacqueline Griffin's weight remains the same since her last visit.  Subjective:   1. Vitamin D deficiency Jacqueline Griffin is currently taking Vitamin D as directed.  2. Essential hypertension Jacqueline Griffin is currently taking Toprol XL. Her hypertension is controlled.  Assessment/Plan:   1. Vitamin D deficiency Low Vitamin D level contributes to fatigue and are associated with obesity, breast, and colon cancer. [name] agrees to continue to take prescription Vitamin D 50,000 IU every week and will follow-up for routine testing of Vitamin D, at least 2-3 times per year to avoid over-replacement.   2. Essential hypertension Jacqueline Griffin will continue medications. She is working on healthy weight loss and exercise to improve blood pressure control. We will watch for signs of hypotension as she continues her lifestyle modifications.   3. Obesity, current BMI 41.7 Jacqueline Griffin is currently in the action stage of change. As such, her goal is to continue with weight loss efforts. She has agreed to the Category 3 Plan.   Jacqueline Griffin will continue meal planning. She will adhere closely to the plan (80-85%). Handouts: Protein and shakes.   Exercise goals:  Jacqueline Griffin will start walking.  Behavioral modification strategies: increasing lean protein intake, decreasing simple carbohydrates, increasing vegetables, increasing water intake, decreasing eating out, no skipping meals, meal planning and  cooking strategies, keeping healthy foods in the home, and planning for success.  Jacqueline Griffin has agreed to follow-up with our clinic in 2-3 weeks. She was informed of the importance of frequent follow-up visits to maximize her success with intensive lifestyle modifications for her multiple health conditions.   Objective:   Blood pressure 118/62, pulse 76, temperature 98.5 F (36.9 C), height 5' 6"  (1.676 m), weight 258 lb (117 kg), SpO2 97 %. Body mass index is 41.64 kg/m.  General: Cooperative, alert, well developed, in no acute distress. HEENT: Conjunctivae and lids unremarkable. Cardiovascular: Regular rhythm.  Lungs: Normal work of breathing. Neurologic: No focal deficits.   Lab Results  Component Value Date   CREATININE 0.50 (L) 10/06/2020   BUN 10 10/06/2020   NA 136 10/06/2020   K 4.2 10/06/2020   CL 101 10/06/2020   CO2 21 10/06/2020   Lab Results  Component Value Date   ALT 8 10/06/2020   AST 13 10/06/2020   ALKPHOS 91 10/06/2020   BILITOT 0.6 10/06/2020   Lab Results  Component Value Date   HGBA1C 5.5 10/06/2020   Lab Results  Component Value Date   INSULIN 22.0 10/06/2020   Lab Results  Component Value Date   TSH 1.490 10/06/2020   Lab Results  Component Value Date   CHOL 174 10/06/2020   HDL 44 10/06/2020   LDLCALC 112 (H) 10/06/2020   TRIG 98 10/06/2020   Lab Results  Component Value Date   VD25OH 16.0 (L) 10/06/2020   Lab Results  Component Value Date   WBC 11.3 (H) 10/06/2020   HGB 10.3 (L) 10/06/2020   HCT 37.0 10/06/2020  MCV 74 (L) 10/06/2020   PLT 379 10/06/2020   No results found for: IRON, TIBC, FERRITIN  Attestation Statements:   Reviewed by clinician on day of visit: allergies, medications, problem list, medical history, surgical history, family history, social history, and previous encounter notes.  Time spent on visit including pre-visit chart review and post-visit care and charting was 20 minutes.   I, Lizbeth Bark, RMA,  am acting as Location manager for [employer], DO.   I have reviewed the above documentation for accuracy and completeness, and I agree with the above. Jearld Lesch, DO

## 2020-11-19 ENCOUNTER — Encounter (INDEPENDENT_AMBULATORY_CARE_PROVIDER_SITE_OTHER): Payer: Self-pay | Admitting: Bariatrics

## 2020-11-19 DIAGNOSIS — E041 Nontoxic single thyroid nodule: Secondary | ICD-10-CM | POA: Diagnosis not present

## 2020-12-01 ENCOUNTER — Other Ambulatory Visit: Payer: Self-pay

## 2020-12-01 ENCOUNTER — Ambulatory Visit (INDEPENDENT_AMBULATORY_CARE_PROVIDER_SITE_OTHER): Payer: BC Managed Care – PPO | Admitting: Physician Assistant

## 2020-12-01 ENCOUNTER — Ambulatory Visit: Payer: Self-pay

## 2020-12-01 ENCOUNTER — Ambulatory Visit (INDEPENDENT_AMBULATORY_CARE_PROVIDER_SITE_OTHER): Payer: BC Managed Care – PPO

## 2020-12-01 ENCOUNTER — Ambulatory Visit (INDEPENDENT_AMBULATORY_CARE_PROVIDER_SITE_OTHER): Payer: BC Managed Care – PPO | Admitting: Family Medicine

## 2020-12-01 ENCOUNTER — Encounter (INDEPENDENT_AMBULATORY_CARE_PROVIDER_SITE_OTHER): Payer: Self-pay | Admitting: Physician Assistant

## 2020-12-01 VITALS — BP 127/67 | HR 93 | Temp 98.0°F | Ht 66.0 in | Wt 253.0 lb

## 2020-12-01 VITALS — BP 126/80 | HR 80 | Ht 66.0 in | Wt 256.6 lb

## 2020-12-01 DIAGNOSIS — M25512 Pain in left shoulder: Secondary | ICD-10-CM | POA: Diagnosis not present

## 2020-12-01 DIAGNOSIS — E559 Vitamin D deficiency, unspecified: Secondary | ICD-10-CM

## 2020-12-01 DIAGNOSIS — Z6841 Body Mass Index (BMI) 40.0 and over, adult: Secondary | ICD-10-CM | POA: Diagnosis not present

## 2020-12-01 DIAGNOSIS — Z9189 Other specified personal risk factors, not elsewhere classified: Secondary | ICD-10-CM | POA: Diagnosis not present

## 2020-12-01 DIAGNOSIS — K5909 Other constipation: Secondary | ICD-10-CM | POA: Diagnosis not present

## 2020-12-01 MED ORDER — HYDROCODONE-ACETAMINOPHEN 5-325 MG PO TABS: 1.0000 | ORAL_TABLET | Freq: Four times a day (QID) | ORAL | 0 refills | Status: DC | PRN

## 2020-12-01 MED ORDER — TIZANIDINE HCL 4 MG PO TABS: 4.0000 mg | ORAL_TABLET | Freq: Three times a day (TID) | ORAL | 1 refills | Status: DC | PRN

## 2020-12-01 MED ORDER — VITAMIN D (ERGOCALCIFEROL) 1.25 MG (50000 UNIT) PO CAPS: 50000.0000 [IU] | ORAL_CAPSULE | ORAL | 0 refills | Status: DC

## 2020-12-01 NOTE — Progress Notes (Signed)
Chief Complaint:   OBESITY Jacqueline Griffin is here to discuss her progress with her obesity treatment plan along with follow-up of her obesity related diagnoses. Jacqueline Griffin is on the Category 3 Plan and states she is following her eating plan approximately 25% of the time. Shaunita states she is walking 30 minutes 3 times per week.  Today's visit was #: 4 Starting weight: 263 lbs Starting date: 10/06/2020 Today's weight: 253 lbs Today's date: 12/01/2020 Total lbs lost to date: 10 Total lbs lost since last in-office visit: 5  Interim History: Jacqueline Griffin is surprised with her weight loss because she "cheated" more often. She did a great job with portion controlling things not on plan, having only 1 piece of fried chicken or 1 piece of pizza if she splurged.  Subjective:   1. Vitamin D deficiency Jacqueline Griffin reports Vit D may have upset her stomach but she is unsure at this point. She is taking it once weekly.  2. Other constipation Pt noticed increased constipation since starting the plan. She may not be drinking enough water. She is eating vegetables daily.  3. At risk for nausea Jacqueline Griffin is at risk for nausea due to constipation.  Assessment/Plan:   1. Vitamin D deficiency Low Vitamin D level contributes to fatigue and are associated with obesity, breast, and colon cancer. She agrees to continue to take prescription Vitamin D 50,000 IU every week and will follow-up for routine testing of Vitamin D, at least 2-3 times per year to avoid over-replacement.  Refill- Vitamin D, Ergocalciferol, (DRISDOL) 1.25 MG (50000 UNIT) CAPS capsule; Take 1 capsule (50,000 Units total) by mouth every 7 (seven) days.  Dispense: 4 capsule; Refill: 0  2. Other constipation Increase water intake and increase exercise. Garry was informed that a decrease in bowel movement frequency is normal while losing weight, but stools should not be hard or painful. Orders and follow up as documented in patient record.    Counseling Getting to Good Bowel Health: Your goal is to have one soft bowel movement each day. Drink at least 8 glasses of water each day. Eat plenty of fiber (goal is over 25 grams each day). It is best to get most of your fiber from dietary sources which includes leafy green vegetables, fresh fruit, and whole grains. You may need to add fiber with the help of OTC fiber supplements. These include Metamucil, Citrucel, and Flaxseed. If you are still having trouble, try adding Miralax or Magnesium Citrate. If all of these changes do not work, Cabin crew.  3. At risk for nausea Jacqueline Griffin was given approximately 15 minutes of nausea prevention counseling today. Jacqueline Griffin is at risk for nausea due to her new or current medication. She was encouraged to titrate her medication slowly, make sure to stay hydrated, eat smaller portions throughout the day, and avoid high fat meals.    4. Obesity, current BMI 40.85  Jacqueline Griffin is currently in the action stage of change. As such, her goal is to continue with weight loss efforts. She has agreed to the Category 3 Plan.   Exercise goals:  As is  Behavioral modification strategies: meal planning and cooking strategies and keeping healthy foods in the home.  [name] has agreed to follow-up with our clinic in 2 weeks. She was informed of the importance of frequent follow-up visits to maximize her success with intensive lifestyle modifications for her multiple health conditions.   Objective:   Blood pressure 127/67, pulse 93, temperature 98 F (36.7  C), height 5' 6"  (1.676 m), weight 253 lb (114.8 kg), last menstrual period 11/13/2020, SpO2 97 %. Body mass index is 40.84 kg/m.  General: Cooperative, alert, well developed, in no acute distress. HEENT: Conjunctivae and lids unremarkable. Cardiovascular: Regular rhythm.  Lungs: Normal work of breathing. Neurologic: No focal deficits.   Lab Results  Component Value Date   CREATININE 0.50  (L) 10/06/2020   BUN 10 10/06/2020   NA 136 10/06/2020   K 4.2 10/06/2020   CL 101 10/06/2020   CO2 21 10/06/2020   Lab Results  Component Value Date   ALT 8 10/06/2020   AST 13 10/06/2020   ALKPHOS 91 10/06/2020   BILITOT 0.6 10/06/2020   Lab Results  Component Value Date   HGBA1C 5.5 10/06/2020   Lab Results  Component Value Date   INSULIN 22.0 10/06/2020   Lab Results  Component Value Date   TSH 1.490 10/06/2020   Lab Results  Component Value Date   CHOL 174 10/06/2020   HDL 44 10/06/2020   LDLCALC 112 (H) 10/06/2020   TRIG 98 10/06/2020   Lab Results  Component Value Date   VD25OH 16.0 (L) 10/06/2020   Lab Results  Component Value Date   WBC 11.3 (H) 10/06/2020   HGB 10.3 (L) 10/06/2020   HCT 37.0 10/06/2020   MCV 74 (L) 10/06/2020   PLT 379 10/06/2020    Attestation Statements:   Reviewed by clinician on day of visit: allergies, medications, problem list, medical history, surgical history, family history, social history, and previous encounter notes.  Coral Ceo, CMA, am acting as transcriptionist for [employer], PA-C.  I have reviewed the above documentation for accuracy and completeness, and I agree with the above. Abby Potash, PA-C

## 2020-12-01 NOTE — Patient Instructions (Addendum)
Thank you for coming in today.   Call or go to the ER if you develop a large red swollen joint with extreme pain or oozing puss.    I've referred you to Physical Therapy.  Let us know if you don't hear from them in one week.   Please get an Xray today before you leave   Recheck in 6 weeks.   Use the medicine if needed mostly at bedtime.  You can also use them with ibuprofen if needed.   Start with the muscle relaxer first.

## 2020-12-01 NOTE — Progress Notes (Signed)
I, Wendy Poet, LAT, ATC, am serving as scribe for Dr. Lynne Leader.  Jacqueline Griffin is a 45 y.o. female who presents to [place] at [facility] today for L shoulder pain.  She was last seen by Dr. Georgina Snell on 09/30/20 for R knee/leg pain.  Today, pt reports L shoulder pain ongoing since 9/4. Pt just woke up in the morning w/ L shoulder pain and the pain has worsened since. Pt recalls she was moving a freezer the day prior, but doesn't recall experiencing pain. She locates her pain to lateral aspect of her L shoulder, w/ a "throbbing" pain all over shoulder.  She was previously seen by Dr. Georgina Snell on 09/18/2017 for R shoulder pain and completed a course of PT.  L shoulder mechanical symptoms: no Radiating pain: yes- into L elbow sometimes Neck pain: neck Aggravating factors: trying to sleep, ABD, any movements Treatments tried: heat, ice,   Diagnostic testing: R shoulder XR- 09/16/17  Pertinent review of systems: No fevers or chills  Relevant historical information: SVT.  Obesity.   Exam:  BP 126/80   Pulse 80   Ht 5' 6"  (1.676 m)   Wt 256 lb 9.6 oz (116.4 kg)   LMP 11/13/2020   SpO2 98%   BMI 41.42 kg/m  General: Well Developed, well nourished, and in no acute distress.   MSK: Left shoulder normal-appearing Tender palpation superior shoulder. Decreased range of motion with pain and guarding. Impingement testing nondiagnostic due to guarding. Strength testing nondiagnostic due to guarding however appears to be intact. Pulses cap refill and sensation are intact distally.    Lab and Radiology Results Procedure: Real-time Ultrasound Guided Injection of left shoulder subacromial bursa Device: Philips Affiniti 50G Images permanently stored and available for review in PACS Ultrasound evaluation left shoulder prior to injection reveals intact rotator cuff tendons and mild subacromial bursitis.  Of note ultrasound evaluation somewhat limited by lack of range of  motion and guarding. Verbal informed consent obtained.  Discussed risks and benefits of procedure. Warned about infection bleeding damage to structures skin hypopigmentation and fat atrophy among others. Patient expresses understanding and agreement Time-out conducted.   Noted no overlying erythema, induration, or other signs of local infection.   Skin prepped in a sterile fashion.   Local anesthesia: Topical Ethyl chloride.   With sterile technique and under real time ultrasound guidance: 40 mg of Kenalog and 2 mL of Marcaine injected into subacromial bursa. Fluid seen entering the bursa.   Completed without difficulty   Pain moderately resolved suggesting accurate placement of the medication.   Advised to call if fevers/chills, erythema, induration, drainage, or persistent bleeding.   Images permanently stored and available for review in the ultrasound unit.  Impression: Technically successful ultrasound guided injection.    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 No acute fractures.  No severe DJD. Await formal radiology review     Assessment and Plan: 45 y.o. female with left shoulder pain.  Pain occurred after lifting a heavy object.  She does not have evidence of a severe rotator cuff tear or other traumatic injury but she is hurting and guarding so much today that they good detailed shoulder exam is not possible.  Plan for subacromial injection and refer to PT and recheck back in about a month or so. Limited tizanidine and hydrocodone for pain  control at bedtime.  We will try them serially.  Try to avoid the hydrocodone if possible.  PDMP reviewed during this encounter. Orders Placed This Encounter  Procedures   [place] LIMITED JOINT SPACE STRUCTURES UP LEFT(NO LINKED CHARGES)    Standing  Status:   Future    Number of Occurrences:   1    Standing Expiration Date:   05/31/2021    Order Specific Question:   Reason for Exam (SYMPTOM  OR DIAGNOSIS REQUIRED)    Answer:   left shoulder pain    Order Specific Question:   Preferred imaging location?    Answer:   [place]   DG Shoulder Left    Standing Status:   Future    Number of Occurrences:   1    Standing Expiration Date:   12/01/2021    Order Specific Question:   Reason for Exam (SYMPTOM  OR DIAGNOSIS REQUIRED)    Answer:   eval left shoudler    Order Specific Question:   Is patient pregnant?    Answer:   No    Order Specific Question:   Preferred imaging location?    Answer:   [facility]   Ambulatory referral to Physical Therapy    Referral Priority:   Routine    Referral Type:   Physical Medicine    Referral Reason:   Specialty Services Required    Requested Specialty:   Physical Therapy    Number of Visits Requested:   1   Meds ordered this encounter  Medications   tiZANidine (ZANAFLEX) 4 MG tablet    Sig: Take 1 tablet (4 mg total) by mouth every 8 (eight) hours as needed for muscle spasms.    Dispense:  30 tablet    Refill:  1   HYDROcodone-acetaminophen (NORCO/VICODIN) 5-325 MG tablet    Sig: Take 1 tablet by mouth every 6 (six) hours as needed.    Dispense:  15 tablet    Refill:  0     Discussed warning signs or symptoms. Please see discharge instructions. Patient expresses understanding.   The above documentation has been reviewed and is accurate and complete Lynne Leader, M.D.

## 2020-12-02 ENCOUNTER — Encounter: Payer: Self-pay | Admitting: Family Medicine

## 2020-12-02 ENCOUNTER — Telehealth: Payer: Self-pay | Admitting: Family Medicine

## 2020-12-02 NOTE — Telephone Encounter (Signed)
Pt seen yesterday for shoulder, had injection. She feels better , still has limited mobility.  She missed work due to this and would like a work note to excuse Tue, Wed, and Thu of this week, if Dr. Georgina Snell agrees. She would RTW on Friday.  This can be sent via [place]. Call pt with any questions.

## 2020-12-02 NOTE — Telephone Encounter (Signed)
Letter sent via [place].  Additionally a copy of this letter has been printed and is available at the front desk.

## 2020-12-02 NOTE — Progress Notes (Signed)
Left shoulder x-ray shows the possibility of some rotator cuff calcific tendinitis.

## 2020-12-03 DIAGNOSIS — Z4509 Encounter for adjustment and management of other cardiac device: Secondary | ICD-10-CM | POA: Diagnosis not present

## 2020-12-03 DIAGNOSIS — F411 Generalized anxiety disorder: Secondary | ICD-10-CM | POA: Diagnosis not present

## 2020-12-03 DIAGNOSIS — I471 Supraventricular tachycardia: Secondary | ICD-10-CM | POA: Diagnosis not present

## 2020-12-07 DIAGNOSIS — R002 Palpitations: Secondary | ICD-10-CM | POA: Diagnosis not present

## 2020-12-07 DIAGNOSIS — Z4509 Encounter for adjustment and management of other cardiac device: Secondary | ICD-10-CM | POA: Diagnosis not present

## 2020-12-07 DIAGNOSIS — R55 Syncope and collapse: Secondary | ICD-10-CM | POA: Diagnosis not present

## 2020-12-09 ENCOUNTER — Other Ambulatory Visit (INDEPENDENT_AMBULATORY_CARE_PROVIDER_SITE_OTHER): Payer: Self-pay | Admitting: Bariatrics

## 2020-12-09 DIAGNOSIS — D508 Other iron deficiency anemias: Secondary | ICD-10-CM

## 2020-12-10 ENCOUNTER — Encounter (INDEPENDENT_AMBULATORY_CARE_PROVIDER_SITE_OTHER): Payer: Self-pay | Admitting: Emergency Medicine

## 2020-12-10 NOTE — Telephone Encounter (Signed)
Msg sent to patient

## 2020-12-10 NOTE — Telephone Encounter (Signed)
Pt last seen by Tracey Aguilar, PA-C.  

## 2020-12-14 DIAGNOSIS — Z20818 Contact with and (suspected) exposure to other bacterial communicable diseases: Secondary | ICD-10-CM | POA: Diagnosis not present

## 2020-12-14 DIAGNOSIS — Z20828 Contact with and (suspected) exposure to other viral communicable diseases: Secondary | ICD-10-CM | POA: Diagnosis not present

## 2020-12-14 DIAGNOSIS — Z20822 Contact with and (suspected) exposure to covid-19: Secondary | ICD-10-CM | POA: Diagnosis not present

## 2020-12-17 ENCOUNTER — Ambulatory Visit: Payer: BC Managed Care – PPO | Admitting: Rehabilitative and Restorative Service Providers"

## 2020-12-18 ENCOUNTER — Ambulatory Visit (INDEPENDENT_AMBULATORY_CARE_PROVIDER_SITE_OTHER): Payer: BC Managed Care – PPO | Admitting: Physician Assistant

## 2020-12-18 ENCOUNTER — Other Ambulatory Visit: Payer: Self-pay

## 2020-12-18 ENCOUNTER — Encounter (INDEPENDENT_AMBULATORY_CARE_PROVIDER_SITE_OTHER): Payer: Self-pay | Admitting: Physician Assistant

## 2020-12-18 VITALS — BP 143/75 | HR 78 | Temp 98.1°F | Ht 66.0 in | Wt 254.0 lb

## 2020-12-18 DIAGNOSIS — Z6841 Body Mass Index (BMI) 40.0 and over, adult: Secondary | ICD-10-CM

## 2020-12-18 DIAGNOSIS — I10 Essential (primary) hypertension: Secondary | ICD-10-CM

## 2020-12-21 DIAGNOSIS — R059 Cough, unspecified: Secondary | ICD-10-CM | POA: Diagnosis not present

## 2020-12-21 DIAGNOSIS — J069 Acute upper respiratory infection, unspecified: Secondary | ICD-10-CM | POA: Diagnosis not present

## 2020-12-21 NOTE — Progress Notes (Signed)
Chief Complaint:   OBESITY Jacqueline Griffin is here to discuss her progress with her obesity treatment plan along with follow-up of her obesity related diagnoses. Jacqueline Griffin is on the Category 3 Plan and states she is following her eating plan approximately 40% of the time. Jacqueline Griffin states she is doing 0 minutes 0 times per week.  Today's visit was #: 5 Starting weight: 263 lbs Starting date: 10/06/2020 Today's weight: 254 lbs Today's date: 12/18/2020 Total lbs lost to date: 9 lbs Total lbs lost since last in-office visit: 0  Interim History: Jacqueline Griffin has been sick recently with congestion and cough. Her appetite has been decreased. She is only eating once daily, if that. She is also not drinking enough water.  Subjective:   1. Essential hypertension Jacqueline Griffin is congested and has a cough today. She is on Metoprolol. She denies chest pain or headaches.  Assessment/Plan:   1. Essential hypertension Jacqueline Griffin will continue all medications. She is working on healthy weight loss and exercise to improve blood pressure control. We will watch for signs of hypotension as she continues her lifestyle modifications.  2. Obesity, current BMI 41.02 Jacqueline Griffin is currently in the action stage of change. As such, her goal is to continue with weight loss efforts. She has agreed to the Category 3 Plan.   Exercise goals: No exercise has been prescribed at this time.  Behavioral modification strategies: increasing lean protein intake, increasing water intake, and no skipping meals.  Jacqueline Griffin has agreed to follow-up with our clinic in 2 weeks. She was informed of the importance of frequent follow-up visits to maximize her success with intensive lifestyle modifications for her multiple health conditions.   Objective:   Blood pressure (!) 143/75, pulse 78, temperature 98.1 F (36.7 C), height 5' 6"  (1.676 m), weight 254 lb (115.2 kg), SpO2 99 %. Body mass index is 41 kg/m.  General: Cooperative, alert, well  developed, in no acute distress. HEENT: Conjunctivae and lids unremarkable. Cardiovascular: Regular rhythm.  Lungs: Normal work of breathing. Neurologic: No focal deficits.   Lab Results  Component Value Date   CREATININE 0.50 (L) 10/06/2020   BUN 10 10/06/2020   NA 136 10/06/2020   K 4.2 10/06/2020   CL 101 10/06/2020   CO2 21 10/06/2020   Lab Results  Component Value Date   ALT 8 10/06/2020   AST 13 10/06/2020   ALKPHOS 91 10/06/2020   BILITOT 0.6 10/06/2020   Lab Results  Component Value Date   HGBA1C 5.5 10/06/2020   Lab Results  Component Value Date   INSULIN 22.0 10/06/2020   Lab Results  Component Value Date   TSH 1.490 10/06/2020   Lab Results  Component Value Date   CHOL 174 10/06/2020   HDL 44 10/06/2020   LDLCALC 112 (H) 10/06/2020   TRIG 98 10/06/2020   Lab Results  Component Value Date   VD25OH 16.0 (L) 10/06/2020   Lab Results  Component Value Date   WBC 11.3 (H) 10/06/2020   HGB 10.3 (L) 10/06/2020   HCT 37.0 10/06/2020   MCV 74 (L) 10/06/2020   PLT 379 10/06/2020   No results found for: IRON, TIBC, FERRITIN  Attestation Statements:   Reviewed by clinician on day of visit: allergies, medications, problem list, medical history, surgical history, family history, social history, and previous encounter notes.  Time spent on visit including pre-visit chart review and post-visit care and charting was 30 minutes.   I, Tonye Pearson, am acting as Location manager for [employer]  Pricilla Holm, PA-C.   I have reviewed the above documentation for accuracy and completeness, and I agree with the above. Abby Potash, PA-C

## 2020-12-24 ENCOUNTER — Encounter: Payer: BC Managed Care – PPO | Admitting: Rehabilitative and Restorative Service Providers"

## 2020-12-31 ENCOUNTER — Encounter: Payer: BC Managed Care – PPO | Admitting: Rehabilitative and Restorative Service Providers"

## 2021-01-04 DIAGNOSIS — R55 Syncope and collapse: Secondary | ICD-10-CM | POA: Diagnosis not present

## 2021-01-04 DIAGNOSIS — R002 Palpitations: Secondary | ICD-10-CM | POA: Diagnosis not present

## 2021-01-04 DIAGNOSIS — Z4509 Encounter for adjustment and management of other cardiac device: Secondary | ICD-10-CM | POA: Diagnosis not present

## 2021-01-05 ENCOUNTER — Ambulatory Visit (INDEPENDENT_AMBULATORY_CARE_PROVIDER_SITE_OTHER): Payer: BC Managed Care – PPO | Admitting: Physician Assistant

## 2021-01-06 ENCOUNTER — Encounter (INDEPENDENT_AMBULATORY_CARE_PROVIDER_SITE_OTHER): Payer: Self-pay

## 2021-01-06 DIAGNOSIS — R002 Palpitations: Secondary | ICD-10-CM | POA: Diagnosis not present

## 2021-01-06 DIAGNOSIS — R55 Syncope and collapse: Secondary | ICD-10-CM | POA: Diagnosis not present

## 2021-01-06 DIAGNOSIS — Z4509 Encounter for adjustment and management of other cardiac device: Secondary | ICD-10-CM | POA: Diagnosis not present

## 2021-01-07 ENCOUNTER — Ambulatory Visit (INDEPENDENT_AMBULATORY_CARE_PROVIDER_SITE_OTHER): Payer: BC Managed Care – PPO | Admitting: Physician Assistant

## 2021-01-07 ENCOUNTER — Other Ambulatory Visit: Payer: Self-pay

## 2021-01-07 ENCOUNTER — Encounter (INDEPENDENT_AMBULATORY_CARE_PROVIDER_SITE_OTHER): Payer: Self-pay | Admitting: Physician Assistant

## 2021-01-07 ENCOUNTER — Encounter: Payer: BC Managed Care – PPO | Admitting: Rehabilitative and Restorative Service Providers"

## 2021-01-07 VITALS — BP 133/76 | HR 76 | Temp 98.9°F | Ht 66.0 in | Wt 250.0 lb

## 2021-01-07 DIAGNOSIS — Z9189 Other specified personal risk factors, not elsewhere classified: Secondary | ICD-10-CM | POA: Diagnosis not present

## 2021-01-07 DIAGNOSIS — E559 Vitamin D deficiency, unspecified: Secondary | ICD-10-CM | POA: Diagnosis not present

## 2021-01-07 DIAGNOSIS — Z6841 Body Mass Index (BMI) 40.0 and over, adult: Secondary | ICD-10-CM

## 2021-01-07 MED ORDER — VITAMIN D (ERGOCALCIFEROL) 1.25 MG (50000 UNIT) PO CAPS: 50000.0000 [IU] | ORAL_CAPSULE | ORAL | 0 refills | Status: DC

## 2021-01-07 NOTE — Progress Notes (Signed)
Chief Complaint:   OBESITY Jacqueline Griffin is here to discuss her progress with her obesity treatment plan along with follow-up of her obesity related diagnoses. Jacqueline Griffin is on the Category 3 Plan and states she is following her eating plan approximately 10% of the time. Jacqueline Griffin states she is not currently exercising.  Today's visit was #: 6 Starting weight: 263 lbs Starting date: 10/06/2020 Today's weight: 250 lbs Today's date: 01/07/2021 Total lbs lost to date: 13 Total lbs lost since last in-office visit: 4  Interim History: Jacqueline Griffin had a root canal last week and has been eating soft foods. She will have to continue to eat soft foods for the next 2-4 weeks. She does not eat dairy products.  Subjective:   1. Vitamin D deficiency Jacqueline Griffin is on prescription Vit D weekly.  2. At risk for osteoporosis Jacqueline Griffin is at higher risk of osteopenia and osteoporosis due to Vitamin D deficiency.   Assessment/Plan:   1. Vitamin D deficiency Low Vitamin D level contributes to fatigue and are associated with obesity, breast, and colon cancer. She agrees to continue to take prescription Vitamin D 50,000 IU every week and will follow-up for routine testing of Vitamin D, at least 2-3 times per year to avoid over-replacement.  Refill- Vitamin D, Ergocalciferol, (DRISDOL) 1.25 MG (50000 UNIT) CAPS capsule; Take 1 capsule (50,000 Units total) by mouth every 7 (seven) days.  Dispense: 4 capsule; Refill: 0  2. At risk for osteoporosis Jacqueline Griffin was given approximately 15 minutes of osteoporosis prevention counseling today. Jacqueline Griffin is at risk for osteopenia and osteoporosis due to her Vitamin D deficiency. She was encouraged to take her Vitamin D and follow her higher calcium diet and increase strengthening exercise to help strengthen her bones and decrease her risk of osteopenia and osteoporosis.  Repetitive spaced learning was employed today to elicit superior memory formation and behavioral change.  3.  Obesity, current BMI 40.37  Jacqueline Griffin is currently in the action stage of change. As such, her goal is to continue with weight loss efforts. She has agreed to the Category 3 Plan.   Soft protein foods reviewed.  Exercise goals:  As is  Behavioral modification strategies: increasing lean protein intake and no skipping meals.  Jacqueline Griffin has agreed to follow-up with our clinic in 2 weeks. She was informed of the importance of frequent follow-up visits to maximize her success with intensive lifestyle modifications for her multiple health conditions.   Objective:   Blood pressure 133/76, pulse 76, temperature 98.9 F (37.2 C), height 5' 6"  (1.676 m), weight 250 lb (113.4 kg), SpO2 99 %. Body mass index is 40.35 kg/m.  General: Cooperative, alert, well developed, in no acute distress. HEENT: Conjunctivae and lids unremarkable. Cardiovascular: Regular rhythm.  Lungs: Normal work of breathing. Neurologic: No focal deficits.   Lab Results  Component Value Date   CREATININE 0.50 (L) 10/06/2020   BUN 10 10/06/2020   NA 136 10/06/2020   K 4.2 10/06/2020   CL 101 10/06/2020   CO2 21 10/06/2020   Lab Results  Component Value Date   ALT 8 10/06/2020   AST 13 10/06/2020   ALKPHOS 91 10/06/2020   BILITOT 0.6 10/06/2020   Lab Results  Component Value Date   HGBA1C 5.5 10/06/2020   Lab Results  Component Value Date   INSULIN 22.0 10/06/2020   Lab Results  Component Value Date   TSH 1.490 10/06/2020   Lab Results  Component Value Date   CHOL 174 10/06/2020  HDL 44 10/06/2020   LDLCALC 112 (H) 10/06/2020   TRIG 98 10/06/2020   Lab Results  Component Value Date   VD25OH 16.0 (L) 10/06/2020   Lab Results  Component Value Date   WBC 11.3 (H) 10/06/2020   HGB 10.3 (L) 10/06/2020   HCT 37.0 10/06/2020   MCV 74 (L) 10/06/2020   PLT 379 10/06/2020   Attestation Statements:   Reviewed by clinician on day of visit: allergies, medications, problem list, medical history,  surgical history, family history, social history, and previous encounter notes.  Coral Ceo, CMA, am acting as transcriptionist for [employer], PA-C.  I have reviewed the above documentation for accuracy and completeness, and I agree with the above. Abby Potash, PA-C

## 2021-01-12 ENCOUNTER — Ambulatory Visit: Payer: BC Managed Care – PPO | Admitting: Family Medicine

## 2021-01-12 NOTE — Progress Notes (Deleted)
   I, Wendy Poet, LAT, ATC, am serving as scribe for Dr. Lynne Leader.  Jacqueline Griffin is a 45 y.o. female who presents to [place] at [facility] today for f/u of L shoulder pain that began on 11/29/20 after helping to move a freezer the day before.  She was last seen by Dr. Georgina Snell on 12/01/20 and had a L subacromial injection.  She was also prescribed Tizanidine and hydrocodone-acetaminophen and referred to PT to [facility].  Today, pt reports   Diagnostic testing: L shoulder XR- 12/01/20  Pertinent review of systems: ***  Relevant historical information: ***   Exam:  There were no vitals taken for this visit. General: Well Developed, well nourished, and in no acute distress.   MSK: ***    Lab and Radiology Results No results found for this or any previous visit (from the past 72 hour(s)). No results found.     Assessment and Plan: 45 y.o. female with ***   PDMP not reviewed this encounter. No orders of the defined types were placed in this encounter.  No orders of the defined types were placed in this encounter.    Discussed warning signs or symptoms. Please see discharge instructions. Patient expresses understanding.   ***

## 2021-01-14 DIAGNOSIS — F411 Generalized anxiety disorder: Secondary | ICD-10-CM | POA: Diagnosis not present

## 2021-01-26 ENCOUNTER — Ambulatory Visit (INDEPENDENT_AMBULATORY_CARE_PROVIDER_SITE_OTHER): Payer: BC Managed Care – PPO | Admitting: Physician Assistant

## 2021-01-26 ENCOUNTER — Other Ambulatory Visit: Payer: Self-pay

## 2021-01-26 ENCOUNTER — Encounter (INDEPENDENT_AMBULATORY_CARE_PROVIDER_SITE_OTHER): Payer: Self-pay | Admitting: Physician Assistant

## 2021-01-26 VITALS — BP 127/75 | HR 70 | Temp 98.3°F | Ht 66.0 in | Wt 252.0 lb

## 2021-01-26 DIAGNOSIS — E88819 Insulin resistance, unspecified: Secondary | ICD-10-CM

## 2021-01-26 DIAGNOSIS — Z6841 Body Mass Index (BMI) 40.0 and over, adult: Secondary | ICD-10-CM

## 2021-01-26 DIAGNOSIS — Z9189 Other specified personal risk factors, not elsewhere classified: Secondary | ICD-10-CM | POA: Diagnosis not present

## 2021-01-26 DIAGNOSIS — E8881 Metabolic syndrome: Secondary | ICD-10-CM | POA: Diagnosis not present

## 2021-01-26 DIAGNOSIS — E559 Vitamin D deficiency, unspecified: Secondary | ICD-10-CM | POA: Diagnosis not present

## 2021-01-26 MED ORDER — VITAMIN D (ERGOCALCIFEROL) 1.25 MG (50000 UNIT) PO CAPS: 50000.0000 [IU] | ORAL_CAPSULE | ORAL | 0 refills | Status: DC

## 2021-01-26 NOTE — Progress Notes (Signed)
Chief Complaint:   OBESITY Jacqueline Griffin is here to discuss her progress with her obesity treatment plan along with follow-up of her obesity related diagnoses. Jacqueline Griffin is on the Category 3 Plan and states she is following her eating plan approximately 10% of the time. Jacqueline Griffin states she is not currently exercising.  Today's visit was #: 7 Starting weight: 263 lbs Starting date: 10/06/2020 Today's weight: 252 lbs Today's date: 01/26/2021 Total lbs lost to date: 11 Total lbs lost since last in-office visit: 0  Interim History: [place] had a root canal yesterday. She reports her biggest issue is time to plan and prep. She is usually good with breakfast and lunch, but struggles with dinner due to homework with kids and feeling tired. She wants to use [employer] for dinners and is asking if that is a healthy way to meet her goals.  Subjective:   1. Vitamin D deficiency Pt denies nausea, vomiting, and muscle weakness and is on prescription Vit D.  2. Insulin resistance Jacqueline Griffin denies polyphagia and is not on medication.  3. At risk for diabetes mellitus Jacqueline Griffin is at higher than average risk for developing diabetes due to obesity.   Assessment/Plan:   1. Vitamin D deficiency Low Vitamin D level contributes to fatigue and are associated with obesity, breast, and colon cancer. She agrees to continue to take prescription Vitamin D 50,000 IU every week and will follow-up for routine testing of Vitamin D, at least 2-3 times per year to avoid over-replacement.  Refill Vit D 50K IU weekly, Disp #4, 0 RF  2. Insulin resistance Jacqueline Griffin will continue to work on weight loss, exercise, and decreasing simple carbohydrates to help decrease the risk of diabetes. Jacqueline Griffin agreed to follow-up with [place] as directed to closely monitor her progress. Continue with weight loss and increase activity.  3. At risk for diabetes mellitus Jacqueline Griffin was given approximately 15 minutes of diabetes education and  counseling today. We discussed intensive lifestyle modifications today with an emphasis on weight loss as well as increasing exercise and decreasing simple carbohydrates in her diet. We also reviewed medication options with an emphasis on risk versus benefit of those discussed.   Repetitive spaced learning was employed today to elicit superior memory formation and behavioral change.  4. Obesity, current BMI 40.69  Jacqueline Griffin is currently in the action stage of change. As such, her goal is to continue with weight loss efforts. She has agreed to the Category 3 Plan and keeping a food journal and adhering to recommended goals of 500-650 calories and 45 grams protein with supper.   Exercise goals:  As is  Behavioral modification strategies: no skipping meals and meal planning and cooking strategies.  Ethan has agreed to follow-up with our clinic in 2 weeks. She was informed of the importance of frequent follow-up visits to maximize her success with intensive lifestyle modifications for her multiple health conditions.   Objective:   Blood pressure 127/75, pulse 70, temperature 98.3 F (36.8 C), height 5' 6"  (1.676 m), weight 252 lb (114.3 kg), SpO2 100 %. Body mass index is 40.67 kg/m.  General: Cooperative, alert, well developed, in no acute distress. HEENT: Conjunctivae and lids unremarkable. Cardiovascular: Regular rhythm.  Lungs: Normal work of breathing. Neurologic: No focal deficits.   Lab Results  Component Value Date   CREATININE 0.50 (L) 10/06/2020   BUN 10 10/06/2020   NA 136 10/06/2020   K 4.2 10/06/2020   CL 101 10/06/2020   CO2 21 10/06/2020  Lab Results  Component Value Date   ALT 8 10/06/2020   AST 13 10/06/2020   ALKPHOS 91 10/06/2020   BILITOT 0.6 10/06/2020   Lab Results  Component Value Date   HGBA1C 5.5 10/06/2020   Lab Results  Component Value Date   INSULIN 22.0 10/06/2020   Lab Results  Component Value Date   TSH 1.490 10/06/2020   Lab Results   Component Value Date   CHOL 174 10/06/2020   HDL 44 10/06/2020   LDLCALC 112 (H) 10/06/2020   TRIG 98 10/06/2020   Lab Results  Component Value Date   VD25OH 16.0 (L) 10/06/2020   Lab Results  Component Value Date   WBC 11.3 (H) 10/06/2020   HGB 10.3 (L) 10/06/2020   HCT 37.0 10/06/2020   MCV 74 (L) 10/06/2020   PLT 379 10/06/2020    Attestation Statements:   Reviewed by clinician on day of visit: allergies, medications, problem list, medical history, surgical history, family history, social history, and previous encounter notes.  Coral Ceo, CMA, am acting as transcriptionist for [employer], PA-C.  I have reviewed the above documentation for accuracy and completeness, and I agree with the above. Abby Potash, PA-C

## 2021-01-27 DIAGNOSIS — F411 Generalized anxiety disorder: Secondary | ICD-10-CM | POA: Diagnosis not present

## 2021-02-03 DIAGNOSIS — G4733 Obstructive sleep apnea (adult) (pediatric): Secondary | ICD-10-CM | POA: Diagnosis not present

## 2021-02-04 DIAGNOSIS — R55 Syncope and collapse: Secondary | ICD-10-CM | POA: Diagnosis not present

## 2021-02-04 DIAGNOSIS — Z4509 Encounter for adjustment and management of other cardiac device: Secondary | ICD-10-CM | POA: Diagnosis not present

## 2021-02-04 DIAGNOSIS — R002 Palpitations: Secondary | ICD-10-CM | POA: Diagnosis not present

## 2021-02-05 DIAGNOSIS — I493 Ventricular premature depolarization: Secondary | ICD-10-CM | POA: Diagnosis not present

## 2021-02-05 DIAGNOSIS — R002 Palpitations: Secondary | ICD-10-CM | POA: Diagnosis not present

## 2021-02-05 DIAGNOSIS — R55 Syncope and collapse: Secondary | ICD-10-CM | POA: Diagnosis not present

## 2021-02-10 ENCOUNTER — Other Ambulatory Visit: Payer: Self-pay

## 2021-02-10 ENCOUNTER — Encounter (INDEPENDENT_AMBULATORY_CARE_PROVIDER_SITE_OTHER): Payer: Self-pay | Admitting: Physician Assistant

## 2021-02-10 ENCOUNTER — Telehealth (INDEPENDENT_AMBULATORY_CARE_PROVIDER_SITE_OTHER): Payer: BC Managed Care – PPO | Admitting: Physician Assistant

## 2021-02-10 DIAGNOSIS — Z6841 Body Mass Index (BMI) 40.0 and over, adult: Secondary | ICD-10-CM | POA: Diagnosis not present

## 2021-02-10 DIAGNOSIS — E8881 Metabolic syndrome: Secondary | ICD-10-CM

## 2021-02-10 NOTE — Progress Notes (Signed)
TeleHealth Visit:  Due to the COVID-19 pandemic, this visit was completed with telemedicine (audio/video) technology to reduce patient and provider exposure as well as to preserve personal protective equipment.   [name] has verbally consented to this TeleHealth visit. The patient is located at home, the provider is located at the [facility] office. The participants in this visit include the listed provider and patient. The visit was conducted today via video.   Chief Complaint: OBESITY Jacqueline Griffin is here to discuss her progress with her obesity treatment plan along with follow-up of her obesity related diagnoses. Jacqueline Griffin is on the Category 3 Plan and keeping a food journal and adhering to recommended goals of 500-650 calories and 45 grams protein with supper and states she is following her eating plan approximately 10% of the time. Jacqueline Griffin states she is not currently exercising.  Today's visit was #: 8 Starting weight: 263 lbs Starting date: 10/06/2020  Interim History: Jacqueline Griffin reports that her appetite has been decreased over the last week since not feeling well. She will be seeing her OBGYN next week for a check up.  Subjective:   1. Insulin resistance Jacqueline Griffin is not on medication and denies polyphagia.  Assessment/Plan:   1. Insulin resistance Intensive lifestyle modifications are the first line treatment for this issue. We discussed several lifestyle modifications today and she will continue to work on diet, exercise and weight loss efforts. Orders and follow up as documented in patient record. Continue with plan. We will monitor insulin levels.  Counseling Polyphagia is excessive hunger. Causes can include: low blood sugars, hypERthyroidism, PMS, lack of sleep, stress, insulin resistance, diabetes, certain medications, and diets that are deficient in protein and fiber.   2. Obesity, current BMI 40.69  Jacqueline Griffin is currently in the action stage of change. As such,  her goal is to continue with weight loss efforts. She has agreed to the Category 3 Plan and keeping a food journal and adhering to recommended goals of 500-650 calories and 45 grams protein with supper.   Supplement with protein shakes, if needed.  Exercise goals:  As is  Behavioral modification strategies: no skipping meals and meal planning and cooking strategies.  Jacqueline Griffin has agreed to follow-up with our clinic in 3 weeks. She was informed of the importance of frequent follow-up visits to maximize her success with intensive lifestyle modifications for her multiple health conditions.  Objective:   VITALS: Per patient if applicable, see vitals. GENERAL: Alert and in no acute distress. CARDIOPULMONARY: No increased WOB. Speaking in clear sentences.  PSYCH: Pleasant and cooperative. Speech normal rate and rhythm. Affect is appropriate. Insight and judgement are appropriate. Attention is focused, linear, and appropriate.  NEURO: Oriented as arrived to appointment on time with no prompting.   Lab Results  Component Value Date   CREATININE 0.50 (L) 10/06/2020   BUN 10 10/06/2020   NA 136 10/06/2020   K 4.2 10/06/2020   CL 101 10/06/2020   CO2 21 10/06/2020   Lab Results  Component Value Date   ALT 8 10/06/2020   AST 13 10/06/2020   ALKPHOS 91 10/06/2020   BILITOT 0.6 10/06/2020   Lab Results  Component Value Date   HGBA1C 5.5 10/06/2020   Lab Results  Component Value Date   INSULIN 22.0 10/06/2020   Lab Results  Component Value Date   TSH 1.490 10/06/2020   Lab Results  Component Value Date   CHOL 174 10/06/2020   HDL 44 10/06/2020   LDLCALC  112 (H) 10/06/2020   TRIG 98 10/06/2020   Lab Results  Component Value Date   VD25OH 16.0 (L) 10/06/2020   Lab Results  Component Value Date   WBC 11.3 (H) 10/06/2020   HGB 10.3 (L) 10/06/2020   HCT 37.0 10/06/2020   MCV 74 (L) 10/06/2020   PLT 379 10/06/2020   No results found for: IRON, TIBC, FERRITIN  Attestation  Statements:   Reviewed by clinician on day of visit: allergies, medications, problem list, medical history, surgical history, family history, social history, and previous encounter notes.  Coral Ceo, CMA, am acting as transcriptionist for [employer], PA-C.  I have reviewed the above documentation for accuracy and completeness, and I agree with the above. Abby Potash, PA-C

## 2021-02-16 DIAGNOSIS — R109 Unspecified abdominal pain: Secondary | ICD-10-CM | POA: Diagnosis not present

## 2021-02-16 DIAGNOSIS — D259 Leiomyoma of uterus, unspecified: Secondary | ICD-10-CM | POA: Diagnosis not present

## 2021-03-05 DIAGNOSIS — G4733 Obstructive sleep apnea (adult) (pediatric): Secondary | ICD-10-CM | POA: Diagnosis not present

## 2021-03-08 ENCOUNTER — Ambulatory Visit (INDEPENDENT_AMBULATORY_CARE_PROVIDER_SITE_OTHER): Payer: BC Managed Care – PPO | Admitting: Physician Assistant

## 2021-03-08 DIAGNOSIS — Z4509 Encounter for adjustment and management of other cardiac device: Secondary | ICD-10-CM | POA: Diagnosis not present

## 2021-03-08 DIAGNOSIS — Z95818 Presence of other cardiac implants and grafts: Secondary | ICD-10-CM | POA: Diagnosis not present

## 2021-03-08 DIAGNOSIS — R002 Palpitations: Secondary | ICD-10-CM | POA: Diagnosis not present

## 2021-03-08 DIAGNOSIS — I471 Supraventricular tachycardia: Secondary | ICD-10-CM | POA: Diagnosis not present

## 2021-03-08 DIAGNOSIS — R55 Syncope and collapse: Secondary | ICD-10-CM | POA: Diagnosis not present

## 2021-03-30 ENCOUNTER — Other Ambulatory Visit: Payer: Self-pay

## 2021-03-30 ENCOUNTER — Ambulatory Visit (INDEPENDENT_AMBULATORY_CARE_PROVIDER_SITE_OTHER): Payer: BC Managed Care – PPO | Admitting: Physician Assistant

## 2021-03-30 ENCOUNTER — Encounter (INDEPENDENT_AMBULATORY_CARE_PROVIDER_SITE_OTHER): Payer: Self-pay | Admitting: Physician Assistant

## 2021-03-30 VITALS — BP 142/71 | HR 82 | Temp 97.9°F | Ht 66.0 in | Wt 254.0 lb

## 2021-03-30 DIAGNOSIS — Z6841 Body Mass Index (BMI) 40.0 and over, adult: Secondary | ICD-10-CM

## 2021-03-30 DIAGNOSIS — E559 Vitamin D deficiency, unspecified: Secondary | ICD-10-CM

## 2021-03-30 DIAGNOSIS — Z9189 Other specified personal risk factors, not elsewhere classified: Secondary | ICD-10-CM

## 2021-03-30 MED ORDER — VITAMIN D (ERGOCALCIFEROL) 1.25 MG (50000 UNIT) PO CAPS: 50000.0000 [IU] | ORAL_CAPSULE | ORAL | 0 refills | Status: DC

## 2021-03-31 NOTE — Progress Notes (Signed)
Chief Complaint:   OBESITY Jacqueline Griffin is here to discuss her progress with her obesity treatment plan along with follow-up of her obesity related diagnoses. Jacqueline Griffin is on the Category 3 Plan and keeping a food journal and adhering to recommended goals of 500-650 calories and 45 grams of protein at supper daily and states she is following her eating plan approximately 0% of the time. Jacqueline Griffin states she is doing 0 minutes 0 times per week.  Today's visit was #: 9 Starting weight: 263 lbs Starting date: 10/06/2020 Today's weight: 254 lbs Today's date: 03/30/2021 Total lbs lost to date: 9 Total lbs lost since last in-office visit: 0  Interim History: Jacqueline Griffin states her appetite continues to be decreased. She often skips meals if she is busy throughout the day. [employer] meal prep information was given today.  Subjective:   1. Vitamin D deficiency Jacqueline Griffin is on Vitamin D, and she is tolerating it well.   2. At risk for osteoporosis Jacqueline Griffin is at higher risk of osteopenia and osteoporosis due to Vitamin D deficiency.   Assessment/Plan:   1. Vitamin D deficiency We will refill prescription Vitamin D for 1 month, and we will recheck labs at her next visit. Jacqueline Griffin will follow-up for routine testing of Vitamin D, at least 2-3 times per year to avoid over-replacement.  - Vitamin D, Ergocalciferol, (DRISDOL) 1.25 MG (50000 UNIT) CAPS capsule; Take 1 capsule (50,000 Units total) by mouth every 7 (seven) days.  Dispense: 4 capsule; Refill: 0  2. At risk for osteoporosis Jacqueline Griffin was given approximately 15 minutes of osteoporosis prevention counseling today. Jacqueline Griffin is at risk for osteopenia and osteoporosis due to her Vitamin D deficiency. She was encouraged to take her Vitamin D and follow her higher calcium diet and increase strengthening exercise to help strengthen her bones and decrease her risk of osteopenia and osteoporosis.  Repetitive spaced learning was employed today to elicit superior  memory formation and behavioral change.  3. Obesity, current BMI 41.02 Jacqueline Griffin is currently in the action stage of change. As such, her goal is to continue with weight loss efforts. She has agreed to the Category 3 Plan and keeping a food journal and adhering to recommended goals of 500-650 calories and 45 grams of protein at supper daily.   We will recheck labs at her next visit.  Exercise goals: No exercise has been prescribed at this time.  Behavioral modification strategies: no skipping meals and meal planning and cooking strategies.  Jacqueline Griffin has agreed to follow-up with our clinic in 2 weeks. She was informed of the importance of frequent follow-up visits to maximize her success with intensive lifestyle modifications for her multiple health conditions.   Objective:   Blood pressure (!) 142/71, pulse 82, temperature 97.9 F (36.6 C), height 5\' 6"  (1.676 m), weight 254 lb (115.2 kg), SpO2 100 %. Body mass index is 41 kg/m.  General: Cooperative, alert, well developed, in no acute distress. HEENT: Conjunctivae and lids unremarkable. Cardiovascular: Regular rhythm.  Lungs: Normal work of breathing. Neurologic: No focal deficits.   Lab Results  Component Value Date   CREATININE 0.50 (L) 10/06/2020   BUN 10 10/06/2020   NA 136 10/06/2020   K 4.2 10/06/2020   CL 101 10/06/2020   CO2 21 10/06/2020   Lab Results  Component Value Date   ALT 8 10/06/2020   AST 13 10/06/2020   ALKPHOS 91 10/06/2020   BILITOT 0.6 10/06/2020   Lab Results  Component Value Date  HGBA1C 5.5 10/06/2020   Lab Results  Component Value Date   INSULIN 22.0 10/06/2020   Lab Results  Component Value Date   TSH 1.490 10/06/2020   Lab Results  Component Value Date   CHOL 174 10/06/2020   HDL 44 10/06/2020   LDLCALC 112 (H) 10/06/2020   TRIG 98 10/06/2020   Lab Results  Component Value Date   VD25OH 16.0 (L) 10/06/2020   Lab Results  Component Value Date   WBC 11.3 (H) 10/06/2020   HGB  10.3 (L) 10/06/2020   HCT 37.0 10/06/2020   MCV 74 (L) 10/06/2020   PLT 379 10/06/2020   No results found for: IRON, TIBC, FERRITIN  Attestation Statements:   Reviewed by clinician on day of visit: allergies, medications, problem list, medical history, surgical history, family history, social history, and previous encounter notes.   Wilhemena Durie, am acting as transcriptionist for [employer], PA-C.  I have reviewed the above documentation for accuracy and completeness, and I agree with the above. Abby Potash, PA-C

## 2021-04-05 ENCOUNTER — Encounter (INDEPENDENT_AMBULATORY_CARE_PROVIDER_SITE_OTHER): Payer: Self-pay | Admitting: Physician Assistant

## 2021-04-05 DIAGNOSIS — G4733 Obstructive sleep apnea (adult) (pediatric): Secondary | ICD-10-CM | POA: Diagnosis not present

## 2021-04-05 NOTE — Telephone Encounter (Signed)
Pt last seen by Tracey Aguilar, PA-C.  

## 2021-04-08 DIAGNOSIS — R002 Palpitations: Secondary | ICD-10-CM | POA: Diagnosis not present

## 2021-04-08 DIAGNOSIS — R55 Syncope and collapse: Secondary | ICD-10-CM | POA: Diagnosis not present

## 2021-04-08 DIAGNOSIS — Z4509 Encounter for adjustment and management of other cardiac device: Secondary | ICD-10-CM | POA: Diagnosis not present

## 2021-04-09 DIAGNOSIS — R55 Syncope and collapse: Secondary | ICD-10-CM | POA: Diagnosis not present

## 2021-04-09 DIAGNOSIS — R002 Palpitations: Secondary | ICD-10-CM | POA: Diagnosis not present

## 2021-04-09 DIAGNOSIS — Z4509 Encounter for adjustment and management of other cardiac device: Secondary | ICD-10-CM | POA: Diagnosis not present

## 2021-04-12 DIAGNOSIS — L02224 Furuncle of groin: Secondary | ICD-10-CM | POA: Diagnosis not present

## 2021-04-14 ENCOUNTER — Ambulatory Visit (INDEPENDENT_AMBULATORY_CARE_PROVIDER_SITE_OTHER): Payer: BC Managed Care – PPO | Admitting: Physician Assistant

## 2021-05-05 DIAGNOSIS — G4733 Obstructive sleep apnea (adult) (pediatric): Secondary | ICD-10-CM | POA: Diagnosis not present

## 2021-05-06 ENCOUNTER — Ambulatory Visit (INDEPENDENT_AMBULATORY_CARE_PROVIDER_SITE_OTHER): Payer: BC Managed Care – PPO | Admitting: Physician Assistant

## 2021-05-06 ENCOUNTER — Other Ambulatory Visit: Payer: Self-pay

## 2021-05-06 ENCOUNTER — Encounter (INDEPENDENT_AMBULATORY_CARE_PROVIDER_SITE_OTHER): Payer: Self-pay | Admitting: Physician Assistant

## 2021-05-06 VITALS — BP 154/86 | HR 70 | Temp 98.0°F | Ht 66.0 in | Wt 255.0 lb

## 2021-05-06 DIAGNOSIS — R7309 Other abnormal glucose: Secondary | ICD-10-CM

## 2021-05-06 DIAGNOSIS — D508 Other iron deficiency anemias: Secondary | ICD-10-CM

## 2021-05-06 DIAGNOSIS — E7849 Other hyperlipidemia: Secondary | ICD-10-CM | POA: Diagnosis not present

## 2021-05-06 DIAGNOSIS — E669 Obesity, unspecified: Secondary | ICD-10-CM

## 2021-05-06 DIAGNOSIS — Z9189 Other specified personal risk factors, not elsewhere classified: Secondary | ICD-10-CM

## 2021-05-06 DIAGNOSIS — E559 Vitamin D deficiency, unspecified: Secondary | ICD-10-CM | POA: Diagnosis not present

## 2021-05-06 DIAGNOSIS — G4733 Obstructive sleep apnea (adult) (pediatric): Secondary | ICD-10-CM | POA: Diagnosis not present

## 2021-05-06 DIAGNOSIS — Z6841 Body Mass Index (BMI) 40.0 and over, adult: Secondary | ICD-10-CM

## 2021-05-06 MED ORDER — VITAMIN D (ERGOCALCIFEROL) 1.25 MG (50000 UNIT) PO CAPS: 50000.0000 [IU] | ORAL_CAPSULE | ORAL | 0 refills | Status: DC

## 2021-05-06 MED ORDER — POLYSACCHARIDE IRON COMPLEX 150 MG PO CAPS: 150.0000 mg | ORAL_CAPSULE | Freq: Every day | ORAL | 0 refills | Status: DC

## 2021-05-06 NOTE — Progress Notes (Signed)
Chief Complaint:   OBESITY [name] is here to discuss her progress with her obesity treatment plan along with follow-up of her obesity related diagnoses. Jacqueline Griffin is on the Category 3 Plan and keeping a food journal and adhering to recommended goals of 500-650 calories and 45 grams protein at supper and states she is following her eating plan approximately 40% of the time. Jacqueline Griffin states she is not currently exercising.  Today's visit was #: 10 Starting weight: 263 lbs Starting date: 10/06/2020 Today's weight: 255 lbs Today's date: 05/06/2021 Total lbs lost to date: 8 Total lbs lost since last in-office visit: 0  Interim History: Pt was skipping breakfast often since her last visit, as she states she wasn't hungry in the morning. She just picked up some long life meal prep meals for lunch and will be planning her dinner meals.  Subjective:   1. Vitamin D deficiency Jacqueline Griffin is on Vit D weekly.  2. Other hyperlipidemia Pt is not on meds and is not exercising.  3. Elevated glucose She denies polyphagia and is not on meds.  4. Other iron deficiency anemia Pt is on iron and denies excessive fatigue or SOB.  5. At risk for diabetes mellitus Jacqueline Griffin is at higher than average risk for developing diabetes due to her obesity.  Assessment/Plan:   1. Vitamin D deficiency Low Vitamin D level contributes to fatigue and are associated with obesity, breast, and colon cancer. She agrees to continue to take prescription Vitamin D @50 ,000 IU every week and will follow-up for routine testing of Vitamin D, at least 2-3 times per year to avoid over-replacement. Check labs today.  Refill- Vitamin D, Ergocalciferol, (DRISDOL) 1.25 MG (50000 UNIT) CAPS capsule; Take 1 capsule (50,000 Units total) by mouth every 7 (seven) days.  Dispense: 4 capsule; Refill: 0  - VITAMIN D 25 Hydroxy (Vit-D Deficiency, Fractures)  2. Other hyperlipidemia Cardiovascular risk and specific lipid/LDL goals reviewed.   We discussed several lifestyle modifications today and Jacqueline Griffin will continue to work on diet, exercise and weight loss efforts. Orders and follow up as documented in patient record.   Counseling Intensive lifestyle modifications are the first line treatment for this issue. Dietary changes: Increase soluble fiber. Decrease simple carbohydrates. Exercise changes: Moderate to vigorous-intensity aerobic activity 150 minutes per week if tolerated. Lipid-lowering medications: see documented in medical record. Check labs today.  - Lipid panel  3. Elevated glucose Fasting labs will be obtained and results with be discussed with Jacqueline Griffin in 2 weeks at her follow up visit. In the meanwhile Jacqueline Griffin was started on a lower simple carbohydrate diet and will work on weight loss efforts.  - Comprehensive metabolic panel - Insulin, random  4. Other iron deficiency anemia Orders and follow up as documented in patient record. Check labs today.  Counseling Iron is essential for our bodies to make red blood cells.  Reasons that someone may be deficient include: an iron-deficient diet (more likely in those following vegan or vegetarian diets), women with heavy menses, patients with GI disorders or poor absorption, patients that have had bariatric surgery, frequent blood donors, patients with cancer, and patients with heart disease.   Iron-rich foods include dark leafy greens, red and white meats, eggs, seafood, and beans.   Certain foods and drinks prevent your body from absorbing iron properly. Avoid eating these foods in the same meal as iron-rich foods or with iron supplements. These foods include: coffee, black tea, and red wine; milk, dairy products, and foods that  are high in calcium; beans and soybeans; whole grains.  Constipation can be a side effect of iron supplementation. Increased water and fiber intake are helpful. Water goal: > 2 liters/day. Fiber goal: > 25 grams/day.  Refill- iron polysaccharides  (NU-IRON) 150 MG capsule; Take 1 capsule (150 mg total) by mouth daily.  Dispense: 30 capsule; Refill: 0  - Anemia panel - Hemoglobin A1c - CBC with Differential/Platelet  5. At risk for diabetes mellitus Jacqueline Griffin was given approximately 15 minutes of diabetic education and counseling today. We discussed intensive lifestyle modifications today with an emphasis on weight loss as well as increasing exercise and decreasing simple carbohydrates in her diet. We also reviewed medication options with an emphasis on risk versus benefits of those discussed.  Repetitive spaced learning was employed today to elicit superior memory formation and behavioral change.  6. Obesity, current BMI 41.18 Jacqueline Griffin is currently in the action stage of change. As such, her goal is to continue with weight loss efforts. She has agreed to the Category 3 Plan and keeping a food journal and adhering to recommended goals of 500-600 calories and 45 grams protein at supper.   Exercise goals:  As is  Behavioral modification strategies: increasing lean protein intake, decreasing simple carbohydrates, and no skipping meals.  Jacqueline Griffin has agreed to follow-up with our clinic in 2 weeks. She was informed of the importance of frequent follow-up visits to maximize her success with intensive lifestyle modifications for her multiple health conditions.   Jacqueline Griffin was informed we would discuss her lab results at her next visit unless there is a critical issue that needs to be addressed sooner. Jacqueline Griffin agreed to keep her next visit at the agreed upon time to discuss these results.  Objective:   Blood pressure (!) 154/86, pulse 70, temperature 98 F (36.7 C), height 5\' 6"  (1.676 m), weight 255 lb (115.7 kg), SpO2 100 %. Body mass index is 41.16 kg/m.  General: Cooperative, alert, well developed, in no acute distress. HEENT: Conjunctivae and lids unremarkable. Cardiovascular: Regular rhythm.  Lungs: Normal work of  breathing. Neurologic: No focal deficits.   Lab Results  Component Value Date   CREATININE 0.50 (L) 10/06/2020   BUN 10 10/06/2020   NA 136 10/06/2020   K 4.2 10/06/2020   CL 101 10/06/2020   CO2 21 10/06/2020   Lab Results  Component Value Date   ALT 8 10/06/2020   AST 13 10/06/2020   ALKPHOS 91 10/06/2020   BILITOT 0.6 10/06/2020   Lab Results  Component Value Date   HGBA1C 5.5 10/06/2020   Lab Results  Component Value Date   INSULIN 22.0 10/06/2020   Lab Results  Component Value Date   TSH 1.490 10/06/2020   Lab Results  Component Value Date   CHOL 174 10/06/2020   HDL 44 10/06/2020   LDLCALC 112 (H) 10/06/2020   TRIG 98 10/06/2020   Lab Results  Component Value Date   VD25OH 16.0 (L) 10/06/2020   Lab Results  Component Value Date   WBC 11.3 (H) 10/06/2020   HGB 10.3 (L) 10/06/2020   HCT 37.0 10/06/2020   MCV 74 (L) 10/06/2020   PLT 379 10/06/2020   Attestation Statements:   Reviewed by clinician on day of visit: allergies, medications, problem list, medical history, surgical history, family history, social history, and previous encounter notes.  Coral Ceo, CMA, am acting as transcriptionist for [employer], PA-C.  I have reviewed the above documentation for accuracy and completeness, and I  agree with the above. Abby Potash, PA-C

## 2021-05-06 NOTE — Progress Notes (Signed)
I, Jacqueline Griffin, LAT, ATC, am serving as scribe for Dr. Lynne Leader.  Jacqueline Griffin is a 46 y.o. female who presents to [place] at [facility] today for f/u of L shoulder pain.  She was last seen by Dr. Georgina Snell on 12/01/20 and had a L subacromial steroid injection.  She was also referred to PT but never attended any visits and was prescribed tizanidine and hydrocodone.  Today, pt reports that her L shoulder pain flared up beginning yesterday.  She states that she may have slept wrong and also notes that she started a new position at work that has her keyboard at a different height that may be contributing.  She locates her pain to her L superior shoulder/ACJ area.  She is having difficulty w/ L shoulder aBd  and functional IR.  Diagnostic testing: L shoulder XR- 12/01/20  Pertinent review of systems: No fevers or chills  Relevant historical information: SVT.  Obesity.   Exam:  BP 112/68 (BP Location: Right Arm, Patient Position: Sitting, Cuff Size: Large)    Pulse 80    Ht 5\' 6"  (1.676 m)    Wt 259 lb 6.4 oz (117.7 kg)    SpO2 99%    BMI 41.87 kg/m  General: Well Developed, well nourished, and in no acute distress.   MSK: Left shoulder: Normal-appearing Nontender. Range of motion: Abduction 90 degrees.  Functional internal rotation posterior iliac crest external rotation full. Strength 4/5 abduction 5/5 external rotation and internal rotation. Positive Hawkins and Neer's test.  Negative Yergason's and speeds test. Pulses cap refill and sensation are intact distally.    Lab and Radiology Results  Procedure: Real-time Ultrasound Guided Injection of left shoulder subacromial bursa device: Philips Affiniti 50G Images permanently stored and available for review in PACS Verbal informed consent obtained.  Discussed risks and benefits of procedure. Warned about infection bleeding damage to structures skin hypopigmentation and fat atrophy among others. Patient expresses  understanding and agreement Time-out conducted.   Noted no overlying erythema, induration, or other signs of local infection.   Skin prepped in a sterile fashion.   Local anesthesia: Topical Ethyl chloride.   With sterile technique and under real time ultrasound guidance: 40 mg of Kenalog and 2 mL of Marcaine injected into subacromial bursa. Fluid seen entering the bursa.   Completed without difficulty   Pain immediately resolved suggesting accurate placement of the medication.   Advised to call if fevers/chills, erythema, induration, drainage, or persistent bleeding.   Images permanently stored and available for review in the ultrasound unit.  Impression: Technically successful ultrasound guided injection.         Assessment and Plan: 46 y.o. female with left shoulder pain thought to be due to subacromial bursitis.  This is now chronic issue with an acute recurrence.  She did not go to physical therapy last time but I think it is helpful that she should go this time.  Referral placed to PT. repeat injection today.  Recheck in 8 weeks.   PDMP not reviewed this encounter. Orders Placed This Encounter  Procedures   [place] LIMITED JOINT SPACE STRUCTURES UP LEFT(NO LINKED CHARGES)    Order Specific Question:   Reason for Exam (SYMPTOM  OR DIAGNOSIS REQUIRED)    Answer:   L shoulder pain    Order Specific Question:   Preferred imaging location?    Answer:   [facility]   Ambulatory referral to Physical Therapy    Referral Priority:  Routine    Referral Type:   Physical Medicine    Referral Reason:   Specialty Services Required    Requested Specialty:   Physical Therapy    Number of Visits Requested:   1   No orders of the defined types were placed in this encounter.    Discussed warning signs or symptoms. Please see discharge instructions. Patient expresses understanding.  The above documentation has been reviewed and is accurate and complete Lynne Leader,  M.D.

## 2021-05-07 ENCOUNTER — Ambulatory Visit: Payer: Self-pay

## 2021-05-07 ENCOUNTER — Encounter: Payer: Self-pay | Admitting: Family Medicine

## 2021-05-07 ENCOUNTER — Ambulatory Visit: Payer: BC Managed Care – PPO | Admitting: Family Medicine

## 2021-05-07 VITALS — BP 112/68 | HR 80 | Ht 66.0 in | Wt 259.4 lb

## 2021-05-07 DIAGNOSIS — M25512 Pain in left shoulder: Secondary | ICD-10-CM

## 2021-05-07 LAB — LIPID PANEL

## 2021-05-07 NOTE — Patient Instructions (Addendum)
Good to see you today.  You had a L shoulder injection.  Call or go to the ER if you develop a large red swollen joint with extreme pain or oozing puss.   I've re-referred you to PT.  Their office will call you to schedule but please let us know if you haven't heard from them in one week regarding scheduling.  Follow-up: 2 months

## 2021-05-08 LAB — CBC WITH DIFFERENTIAL/PLATELET
Basophils Absolute: 0 10*3/uL (ref 0.0–0.2)
Basos: 0 %
EOS (ABSOLUTE): 0.2 10*3/uL (ref 0.0–0.4)
Eos: 2 %
Hemoglobin: 10.7 g/dL — ABNORMAL LOW (ref 11.1–15.9)
Immature Grans (Abs): 0 10*3/uL (ref 0.0–0.1)
Immature Granulocytes: 0 %
Lymphocytes Absolute: 2 10*3/uL (ref 0.7–3.1)
Lymphs: 23 %
MCH: 22.1 pg — ABNORMAL LOW (ref 26.6–33.0)
MCHC: 29.8 g/dL — ABNORMAL LOW (ref 31.5–35.7)
MCV: 74 fL — ABNORMAL LOW (ref 79–97)
Monocytes Absolute: 0.6 10*3/uL (ref 0.1–0.9)
Monocytes: 7 %
Neutrophils Absolute: 5.9 10*3/uL (ref 1.4–7.0)
Neutrophils: 68 %
Platelets: 375 10*3/uL (ref 150–450)
RBC: 4.84 x10E6/uL (ref 3.77–5.28)
RDW: 17.1 % — ABNORMAL HIGH (ref 11.7–15.4)
WBC: 8.6 10*3/uL (ref 3.4–10.8)

## 2021-05-08 LAB — ANEMIA PANEL
Ferritin: 7 ng/mL — ABNORMAL LOW (ref 15–150)
Folate, Hemolysate: 319 ng/mL
Folate, RBC: 889 ng/mL (ref >498)
Hematocrit: 35.9 % (ref 34.0–46.6)
Iron Saturation: 6 % — CL (ref 15–55)
Iron: 22 ug/dL — ABNORMAL LOW (ref 27–159)
Retic Ct Pct: 1.4 % (ref 0.6–2.6)
Total Iron Binding Capacity: 369 ug/dL (ref 250–450)
UIBC: 347 ug/dL (ref 131–425)
Vitamin B-12: 666 pg/mL (ref 232–1245)

## 2021-05-08 LAB — COMPREHENSIVE METABOLIC PANEL
ALT: 6 IU/L (ref 0–32)
AST: 17 IU/L (ref 0–40)
Albumin/Globulin Ratio: 1.1 — ABNORMAL LOW (ref 1.2–2.2)
Albumin: 4 g/dL (ref 3.8–4.8)
Alkaline Phosphatase: 94 IU/L (ref 44–121)
BUN/Creatinine Ratio: 14 (ref 9–23)
BUN: 8 mg/dL (ref 6–24)
Bilirubin Total: 0.6 mg/dL (ref 0.0–1.2)
CO2: 21 mmol/L (ref 20–29)
Calcium: 9 mg/dL (ref 8.7–10.2)
Chloride: 102 mmol/L (ref 96–106)
Creatinine, Ser: 0.57 mg/dL (ref 0.57–1.00)
Globulin, Total: 3.6 g/dL (ref 1.5–4.5)
Glucose: 74 mg/dL (ref 70–99)
Potassium: 4.1 mmol/L (ref 3.5–5.2)
Sodium: 140 mmol/L (ref 134–144)
Total Protein: 7.6 g/dL (ref 6.0–8.5)
eGFR: 114 mL/min/{1.73_m2} (ref >59)

## 2021-05-08 LAB — LIPID PANEL
Chol/HDL Ratio: 3.8 ratio (ref 0.0–4.4)
Cholesterol, Total: 184 mg/dL (ref 100–199)
HDL: 49 mg/dL (ref >39)
LDL Chol Calc (NIH): 117 mg/dL — ABNORMAL HIGH (ref 0–99)
Triglycerides: 98 mg/dL (ref 0–149)
VLDL Cholesterol Cal: 18 mg/dL (ref 5–40)

## 2021-05-08 LAB — HEMOGLOBIN A1C
Est. average glucose Bld gHb Est-mCnc: 111 mg/dL
Hgb A1c MFr Bld: 5.5 % (ref 4.8–5.6)

## 2021-05-08 LAB — INSULIN, RANDOM: INSULIN: 13.5 u[IU]/mL (ref 2.6–24.9)

## 2021-05-08 LAB — VITAMIN D 25 HYDROXY (VIT D DEFICIENCY, FRACTURES): Vit D, 25-Hydroxy: 29.3 ng/mL — ABNORMAL LOW (ref 30.0–100.0)

## 2021-05-10 DIAGNOSIS — R002 Palpitations: Secondary | ICD-10-CM | POA: Diagnosis not present

## 2021-05-10 DIAGNOSIS — R55 Syncope and collapse: Secondary | ICD-10-CM | POA: Diagnosis not present

## 2021-05-10 DIAGNOSIS — Z4509 Encounter for adjustment and management of other cardiac device: Secondary | ICD-10-CM | POA: Diagnosis not present

## 2021-05-14 DIAGNOSIS — Z1231 Encounter for screening mammogram for malignant neoplasm of breast: Secondary | ICD-10-CM | POA: Diagnosis not present

## 2021-05-26 ENCOUNTER — Encounter: Payer: Self-pay | Admitting: Physical Therapy

## 2021-05-26 ENCOUNTER — Ambulatory Visit: Payer: BC Managed Care – PPO | Attending: Family Medicine | Admitting: Physical Therapy

## 2021-05-26 ENCOUNTER — Other Ambulatory Visit: Payer: Self-pay

## 2021-05-26 DIAGNOSIS — M6281 Muscle weakness (generalized): Secondary | ICD-10-CM | POA: Diagnosis not present

## 2021-05-26 DIAGNOSIS — R001 Bradycardia, unspecified: Secondary | ICD-10-CM | POA: Diagnosis not present

## 2021-05-26 DIAGNOSIS — R55 Syncope and collapse: Secondary | ICD-10-CM | POA: Diagnosis not present

## 2021-05-26 DIAGNOSIS — M25512 Pain in left shoulder: Secondary | ICD-10-CM | POA: Insufficient documentation

## 2021-05-26 NOTE — Therapy (Signed)
[facility] ?[facility] ?[address] ?[place], [place], [address] ?Phone: [phone]   Fax:  [phone] ? ?Physical Therapy Evaluation ? ?Patient Details  ?Name: Jacqueline Griffin ?MRN: [id number] ?Date of Birth: 07-27-1975 ?Referring Provider (PT): Lynne Leader ? ? ?Encounter Date: 05/26/2021 ? ? PT End of Session - 05/26/21 1628   ? ? Visit Number 1   ? Number of Visits 6   ? Date for PT Re-Evaluation 07/07/21   ? PT Start Time 9242   ? PT Stop Time 6834   ? PT Time Calculation (min) 44 min   ? Activity Tolerance Patient tolerated treatment well   ? Behavior During Therapy [facility] for tasks assessed/performed   ? ?  ?  ? ?  ? ? ?Past Medical History:  ?Diagnosis Date  ? Anemia   ? Anxiety   ? Chronic GERD   ? Depression   ? Fatty liver   ? Food allergy   ? Hypertension 09/18/2017  ? Iron deficiency   ? Joint pain   ? Morbid obesity ([place]) 04/01/2012  ? NASH (nonalcoholic steatohepatitis) 11/28/2012  ? OSA (obstructive sleep apnea)   ? OSA on CPAP 04/29/2016  ? Palpitations   ? Paroxysmal supraventricular tachycardia ([place]) 04/01/2009  ? Overview:   ? Supraventricular tachycardia ([place]) 1997  ? Symptomatic PVCs   ? Thyroid nodule 11/07/2014  ? Uterine fibroid   ? ? ?Past Surgical History:  ?Procedure Laterality Date  ? CESAREAN SECTION    ? HIP SURGERY    ? MANDIBLE RECONSTRUCTION  46 yo  ? ? ?There were no vitals filed for this visit. ? ? ? Subjective Assessment - 05/26/21 1551   ? ? Subjective Pt had insiduous onset of Lt shoulder pain about 2 months ago. She got a cortisone shot about 3 weeks ago and pain has decreased and mobility has improved. pt states she still feels "tightness" in Lt anterior and lateral shoulder. Pain increases with sleeping on Lt side and with abduction, improves with cortisone injections.   ? Pertinent History Rt shoulder pain that improved with cortisone injection and PT   ? Limitations Lifting   ? Diagnostic tests x ray: No acute osseous  abnormality.  2. Questionable soft tissue calcifications projecting over the  lateral humeral head, may represent calcific tendinopathy.  3. Tiny soft tissue calcification just inferior to the  acromioclavicular joint.   ? Patient Stated Goals decrease pain and tightness   ? Currently in Pain? Yes   ? Pain Score 2    ? Pain Location Shoulder   ? Pain Orientation Left   ? Pain Descriptors / Indicators Aching;Sore   ? Pain Type Acute pain   ? Pain Onset More than a month ago   ? Pain Frequency Intermittent   ? Aggravating Factors  sleeping on Lt, Lt shoulder abduction   ? Pain Relieving Factors cortisone shot   ? ?  ?  ? ?  ? ? ? ? ? [facility] PT Assessment - 05/26/21 0001   ? ?  ? Assessment  ? Medical Diagnosis Left shoulder pain   ? Referring Provider (PT) Lynne Leader   ? Onset Date/Surgical Date 03/29/21   ? Hand Dominance Right   ? Next MD Visit PRN   ? Prior Therapy for Rt shoulder   ?  ? Precautions  ? Precautions None   ? Precaution Comments implanted heart monitor   ?  ? Restrictions  ? Weight Bearing Restrictions No   ?  ?  Balance Screen  ? Has the patient fallen in the past 6 months No   ?  ? Prior Function  ? Level of Independence Independent   ?  ? Observation/Other Assessments  ? Focus on Therapeutic Outcomes (FOTO)  63   ?  ? ROM / Strength  ? AROM / PROM / Strength AROM;Strength   ?  ? AROM  ? AROM Assessment Site Shoulder   ? Right/Left Shoulder Right;Left   ? Left Shoulder Extension 30 Degrees   ? Left Shoulder Flexion 145 Degrees   ? Left Shoulder ABduction 140 Degrees   ? Left Shoulder Internal Rotation 80 Degrees   ? Left Shoulder External Rotation 72 Degrees   ?  ? Strength  ? Strength Assessment Site Shoulder   ? Right/Left Shoulder Right;Left   ? Right Shoulder Flexion 4/5   ? Right Shoulder ABduction 4/5   ? Left Shoulder Flexion 4/5   ? Left Shoulder ABduction 4/5   ? Left Shoulder Internal Rotation 4-/5   ? Left Shoulder External Rotation 4+/5   ?  ? Palpation  ? Palpation comment TTP with  inferior and anterior GH mobs, decreased pain with [place] jt distraction   ?  ? Special Tests  ? Other special tests empty can (+) Lt   ? ?  ?  ? ?  ? ? ? ? ? ? ? ? ? ? ? ? ? ?Objective measurements completed on examination: See above findings.  ? ? ? ? ? [facility] Adult PT Treatment/Exercise - 05/26/21 0001   ? ?  ? Exercises  ? Exercises Shoulder   ?  ? Shoulder Exercises: Standing  ? Extension 10 reps   ? Theraband Level (Shoulder Extension) Level 2 (Red)   ? Row 10 reps   ? Theraband Level (Shoulder Row) Level 2 (Red)   ?  ? Shoulder Exercises: Stretch  ? Other Shoulder Stretches bicep stretch at counter x 20 sec, doorway stretch 90 degrees 2 x 20sec   ?  ? Modalities  ? Modalities Moist Heat   ?  ? Moist Heat Therapy  ? Number Minutes Moist Heat 10 Minutes   ? Moist Heat Location Shoulder   ? ?  ?  ? ?  ? ? ? ? ? ? ? ? ? ? PT Education - 05/26/21 1628   ? ? Education Details PT POC and goals, HEP   ? Person(s) Educated Patient   ? Methods Explanation;Demonstration;Handout   ? Comprehension Returned demonstration;Verbalized understanding   ? ?  ?  ? ?  ? ? ? ? ? ? PT Long Term Goals - 05/26/21 1642   ? ?  ? PT LONG TERM GOAL #1  ? Title Pt will be independent with HEP   ? Time 6   ? Period Weeks   ? Status New   ? Target Date 07/07/21   ?  ? PT LONG TERM GOAL #2  ? Title Pt will improve FOTO to >= 71 to demo improved functional mobility   ? Time 6   ? Period Weeks   ? Status New   ? Target Date 07/07/21   ?  ? PT LONG TERM GOAL #3  ? Title Pt will report decrease in pain with lifting objects overhead to <= 2/10   ? Time 6   ? Period Weeks   ? Status New   ? Target Date 07/07/21   ?  ? PT LONG TERM GOAL #4  ? Title  Pt will tolerate sleeping on Lt side with pain <=2/10   ? Time 6   ? Period Weeks   ? Status New   ? Target Date 07/07/21   ? ?  ?  ? ?  ? ? ? ? ? ? ? ? ? Plan - 05/26/21 1640   ? ? Clinical Impression Statement Pt is a 46 y/o female referred for LT shoulder pain. Pt presents with decreased postural strengthy,  GH jt hypomobililty, increased pain, decreased flexibility, decreased functional activity tolerance. Pt will benefit from skilled PT to address deficits and improve functional mobility   ? Personal Factors and Comorbidities Past/Current Experience;Time since onset of injury/illness/exacerbation   ? Examination-Activity Limitations Reach Overhead;Lift   ? Examination-Participation Restrictions Occupation;Cleaning   ? Stability/Clinical Decision Making Stable/Uncomplicated   ? Clinical Decision Making Low   ? Rehab Potential Good   ? PT Frequency 1x / week   ? PT Duration 6 weeks   ? PT Treatment/Interventions Manual techniques;Patient/family education;Passive range of motion;Dry needling;Vasopneumatic Device;Cryotherapy;Moist Heat;Iontophoresis 50m/ml Dexamethasone;Neuromuscular re-education;Therapeutic exercise;Therapeutic activities   ? PT Next Visit Plan assess HEP, progress postural strength   ? PT Home Exercise Plan [phone]  ? Consulted and Agree with Plan of Care Patient   ? ?  ?  ? ?  ? ? ?Patient will benefit from skilled therapeutic intervention in order to improve the following deficits and impairments:  Pain, Impaired flexibility, Decreased strength, Decreased activity tolerance, Hypomobility ? ?Visit Diagnosis: ?Muscle weakness (generalized) - Plan: PT plan of care cert/re-cert ? ?Left shoulder pain, unspecified chronicity - Plan: PT plan of care cert/re-cert ? ? ? ? ?Problem List ?Patient Active Problem List  ? Diagnosis Date Noted  ? Vitamin D deficiency 10/07/2020  ? Iron deficiency anemia 10/07/2020  ? Hearing loss 09/18/2017  ? Heart burn 09/18/2017  ? Hypertension 09/18/2017  ? OSA on CPAP 04/29/2016  ? Thyroid nodule 11/07/2014  ? NASH (nonalcoholic steatohepatitis) 11/28/2012  ? Morbid obesity ([place] 04/01/2012  ? Menorrhagia 09/25/2010  ? Paroxysmal supraventricular tachycardia ([place] 04/01/2009  ? ? ?Elianis Fischbach, PT ?05/26/2021, 4:46 PM ? ? ?[facility] ?[address]?[place] [address]?Phone: [phone]  Fax:  [phone]? ?Name: CCristina Griffin?MRN: [id number]?Date of Birth: [birth year]? ? ?

## 2021-05-26 NOTE — Patient Instructions (Signed)
Access Code: [email or web address] ?URL: [email or web address] ?Date: 05/26/2021 ?Prepared by: Isabelle Course ? ?Exercises ?Doorway Pec Stretch at 90 Degrees Abduction - 1 x daily - 7 x weekly - 1 sets - 3 reps - 20-30 sec hold ?Bicep Stretch at Table - 1 x daily - 7 x weekly - 1 sets - 3 reps - 20-30 sec hold ?Standing Bilateral Low Shoulder Row with Anchored Resistance - 1 x daily - 7 x weekly - 3 sets - 10 reps ?Shoulder extension with resistance - Neutral - 1 x daily - 7 x weekly - 3 sets - 10 reps ? ?

## 2021-06-01 ENCOUNTER — Ambulatory Visit (INDEPENDENT_AMBULATORY_CARE_PROVIDER_SITE_OTHER): Payer: BC Managed Care – PPO | Admitting: Physician Assistant

## 2021-06-02 ENCOUNTER — Ambulatory Visit: Payer: BC Managed Care – PPO | Admitting: Physical Therapy

## 2021-06-02 ENCOUNTER — Other Ambulatory Visit: Payer: Self-pay

## 2021-06-02 DIAGNOSIS — G4733 Obstructive sleep apnea (adult) (pediatric): Secondary | ICD-10-CM | POA: Diagnosis not present

## 2021-06-02 DIAGNOSIS — M6281 Muscle weakness (generalized): Secondary | ICD-10-CM | POA: Diagnosis not present

## 2021-06-02 DIAGNOSIS — M25512 Pain in left shoulder: Secondary | ICD-10-CM

## 2021-06-02 NOTE — Therapy (Signed)
[place] [facility] ?[facility] ?[address] ?[place], [place], [address] ?Phone: [phone]   Fax:  [phone] ? ?Physical Therapy Treatment ? ?Patient Details  ?Name: Jacqueline Griffin ?MRN: [id number] ?Date of Birth: 1975-12-14 ?Referring Provider (PT): Lynne Leader ? ? ?Encounter Date: 06/02/2021 ? ? PT End of Session - 06/02/21 1603   ? ? Visit Number 2   ? Number of Visits 6   ? Date for PT Re-Evaluation 07/07/21   ? PT Start Time 1603   ? PT Stop Time 6962   ? PT Time Calculation (min) 42 min   ? Activity Tolerance Patient tolerated treatment well   ? Behavior During Therapy [facility] for tasks assessed/performed   ? ?  ?  ? ?  ? ? ?Past Medical History:  ?Diagnosis Date  ? Anemia   ? Anxiety   ? Chronic GERD   ? Depression   ? Fatty liver   ? Food allergy   ? Hypertension 09/18/2017  ? Iron deficiency   ? Joint pain   ? Morbid obesity ([place]) 04/01/2012  ? NASH (nonalcoholic steatohepatitis) 11/28/2012  ? OSA (obstructive sleep apnea)   ? OSA on CPAP 04/29/2016  ? Palpitations   ? Paroxysmal supraventricular tachycardia ([place]) 04/01/2009  ? Overview:   ? Supraventricular tachycardia ([place]) 1997  ? Symptomatic PVCs   ? Thyroid nodule 11/07/2014  ? Uterine fibroid   ? ? ?Past Surgical History:  ?Procedure Laterality Date  ? CESAREAN SECTION    ? HIP SURGERY    ? MANDIBLE RECONSTRUCTION  46 yo  ? ? ?There were no vitals filed for this visit. ? ? Subjective Assessment - 06/02/21 1604   ? ? Subjective Pt states she's doing the stretches the most. She feels that they are helping some.   ? Pertinent History Rt shoulder pain that improved with cortisone injection and PT   ? Limitations Lifting   ? Diagnostic tests x ray: No acute osseous abnormality.  2. Questionable soft tissue calcifications projecting over the  lateral humeral head, may represent calcific tendinopathy.  3. Tiny soft tissue calcification just inferior to the  acromioclavicular joint.   ? Patient Stated Goals decrease  pain and tightness   ? Currently in Pain? Yes   ? Pain Score 4    ? Pain Location Shoulder   ? Pain Orientation Left   ? Pain Descriptors / Indicators Tightness   ? Pain Type Acute pain   ? Pain Onset More than a month ago   ? ?  ?  ? ?  ? ? ? ? ? ? ? ? ? ? ? ? ? ? ? ? ? ? ? ? [facility] Adult PT Treatment/Exercise - 06/02/21 0001   ? ?  ? Shoulder Exercises: Seated  ? External Rotation Strengthening;20 reps;Theraband   ? Theraband Level (Shoulder External Rotation) Level 2 (Red)   ?  ? Shoulder Exercises: Standing  ? Extension Strengthening;Both;20 reps;Theraband   ? Theraband Level (Shoulder Extension) Level 2 (Red)   ? Row 20 reps;Theraband   ? Theraband Level (Shoulder Row) Level 2 (Red)   ? Other Standing Exercises Against pool noodle: "W" 2x10, "T" 2x10   ? Other Standing Exercises low trap setting x10   ?  ? Shoulder Exercises: Pulleys  ? Flexion 1 minute   ? Scaption 1 minute   ?  ? Shoulder Exercises: Stretch  ? Other Shoulder Stretches bicep stretch at counter x 20 sec, doorway stretch 90 degrees  2 x 20sec   ?  ? Moist Heat Therapy  ? Number Minutes Moist Heat 5 Minutes   ? Moist Heat Location Shoulder   ?  ? Manual Therapy  ? Manual Therapy Soft tissue mobilization   ? Manual therapy comments STM and TPR L pecs and bicep   ? Soft tissue mobilization self myofacial release and massage with soft ball against pec   ? ?  ?  ? ?  ? ? ? ? ? ? ? ? ? ? ? ? ? ? ? PT Long Term Goals - 05/26/21 1642   ? ?  ? PT LONG TERM GOAL #1  ? Title Pt will be independent with HEP   ? Time 6   ? Period Weeks   ? Status New   ? Target Date 07/07/21   ?  ? PT LONG TERM GOAL #2  ? Title Pt will improve FOTO to >= 71 to demo improved functional mobility   ? Time 6   ? Period Weeks   ? Status New   ? Target Date 07/07/21   ?  ? PT LONG TERM GOAL #3  ? Title Pt will report decrease in pain with lifting objects overhead to <= 2/10   ? Time 6   ? Period Weeks   ? Status New   ? Target Date 07/07/21   ?  ? PT LONG TERM GOAL #4  ? Title Pt  will tolerate sleeping on Lt side with pain <=2/10   ? Time 6   ? Period Weeks   ? Status New   ? Target Date 07/07/21   ? ?  ?  ? ?  ? ? ? ? ? ? ? ? Plan - 06/02/21 1650   ? ? Clinical Impression Statement Reviewed pt's HEP. Continued to work on postural strengthening. Discussed providing self myofacial release with ball against her pec.   ? Personal Factors and Comorbidities Past/Current Experience;Time since onset of injury/illness/exacerbation   ? Examination-Activity Limitations Reach Overhead;Lift   ? Examination-Participation Restrictions Occupation;Cleaning   ? Stability/Clinical Decision Making Stable/Uncomplicated   ? Rehab Potential Good   ? PT Frequency 1x / week   ? PT Duration 6 weeks   ? PT Treatment/Interventions Manual techniques;Patient/family education;Passive range of motion;Dry needling;Vasopneumatic Device;Cryotherapy;Moist Heat;Iontophoresis 78m/ml Dexamethasone;Neuromuscular re-education;Therapeutic exercise;Therapeutic activities   ? PT Next Visit Plan assess HEP, progress postural strength   ? PT Home Exercise Plan [phone]  ? Consulted and Agree with Plan of Care Patient   ? ?  ?  ? ?  ? ? ?Patient will benefit from skilled therapeutic intervention in order to improve the following deficits and impairments:  Pain, Impaired flexibility, Decreased strength, Decreased activity tolerance, Hypomobility ? ?Visit Diagnosis: ?Muscle weakness (generalized) ? ?Left shoulder pain, unspecified chronicity ? ? ? ? ?Problem List ?Patient Active Problem List  ? Diagnosis Date Noted  ? Vitamin D deficiency 10/07/2020  ? Iron deficiency anemia 10/07/2020  ? Hearing loss 09/18/2017  ? Heart burn 09/18/2017  ? Hypertension 09/18/2017  ? OSA on CPAP 04/29/2016  ? Thyroid nodule 11/07/2014  ? NASH (nonalcoholic steatohepatitis) 11/28/2012  ? Morbid obesity ([facility] 04/01/2012  ? Menorrhagia 09/25/2010  ? Paroxysmal supraventricular tachycardia ([facility] 04/01/2009  ? ? ?Kaetlin Bullen April MGordy Levan PT, DPT ?06/02/2021,  4:55 PM ? ?[facility] ?[facility] ?[address]?[place] [address]?Phone: [phone]  Fax:  [phone]? ?Name: CCristina Griffin?MRN: [id number]?  Date of Birth: 20-Oct-1975 ? ? ? ?

## 2021-06-03 ENCOUNTER — Other Ambulatory Visit (INDEPENDENT_AMBULATORY_CARE_PROVIDER_SITE_OTHER): Payer: Self-pay | Admitting: Physician Assistant

## 2021-06-03 ENCOUNTER — Encounter (INDEPENDENT_AMBULATORY_CARE_PROVIDER_SITE_OTHER): Payer: Self-pay | Admitting: Physician Assistant

## 2021-06-03 ENCOUNTER — Ambulatory Visit (INDEPENDENT_AMBULATORY_CARE_PROVIDER_SITE_OTHER): Payer: BC Managed Care – PPO | Admitting: Physician Assistant

## 2021-06-03 VITALS — BP 142/75 | HR 87 | Temp 97.4°F | Ht 66.0 in | Wt 256.0 lb

## 2021-06-03 DIAGNOSIS — E669 Obesity, unspecified: Secondary | ICD-10-CM

## 2021-06-03 DIAGNOSIS — D508 Other iron deficiency anemias: Secondary | ICD-10-CM

## 2021-06-03 DIAGNOSIS — E559 Vitamin D deficiency, unspecified: Secondary | ICD-10-CM

## 2021-06-03 DIAGNOSIS — Z6841 Body Mass Index (BMI) 40.0 and over, adult: Secondary | ICD-10-CM

## 2021-06-03 DIAGNOSIS — Z9189 Other specified personal risk factors, not elsewhere classified: Secondary | ICD-10-CM

## 2021-06-03 MED ORDER — VITAMIN D (ERGOCALCIFEROL) 1.25 MG (50000 UNIT) PO CAPS: 50000.0000 [IU] | ORAL_CAPSULE | ORAL | 0 refills | Status: DC

## 2021-06-03 MED ORDER — POLYSACCHARIDE IRON COMPLEX 150 MG PO CAPS: 150.0000 mg | ORAL_CAPSULE | Freq: Every day | ORAL | 0 refills | Status: DC

## 2021-06-03 NOTE — Progress Notes (Signed)
? ? ? ?Chief Complaint:  ? ?OBESITY ?Jacqueline Griffin is here to discuss her progress with her obesity treatment plan along with follow-up of her obesity related diagnoses. [name] is on the Category 3 Plan and states she is following her eating plan approximately 20% of the time. [name] states she is doing 0 minutes 0 times per week. ? ?Today's visit was #: 16 ?Starting weight: 263 lbs ?Starting date: 10/06/2020 ?Today's weight: 256 lbs ?Today's date: 06/03/2021 ?Total lbs lost to date: 7 lbs ?Total lbs lost since last in-office visit: 0 ? ?Interim History: [name] has been skipping less meals. She has been ordering from [employer] prep for lunch. She is making good choices but only eats 1/2 of her meals due to decreased hunger. She is thinking about doing [employer] meal prep for dinner as well.  ? ?Subjective:  ? ?1. Vitamin D deficiency ?Jacqueline Griffin's last level improved but not at goal. We discussed labs today.  ? ?2. Other iron deficiency anemia ?Jacqueline Griffin started taking iron as prescribed recently. She is having ultra sound of pelvis in the next few weeks due to Metromenorrhagia.  ? ?3. At risk for impaired metabolic function ?Jacqueline Griffin is at increased risk for impaired metabolic function due to skipping meals.  ? ?Assessment/Plan:  ? ?1. Vitamin D deficiency ?Low Vitamin D level contributes to fatigue and are associated with obesity, breast, and colon cancer. We will refill prescription Vitamin D 50,000 IU every week for 1 month with no refills and Aliceson will follow-up for routine testing of Vitamin D, at least 2-3 times per year to avoid over-replacement. ? ?- Vitamin D, Ergocalciferol, (DRISDOL) 1.25 MG (50000 UNIT) CAPS capsule; Take 1 capsule (50,000 Units total) by mouth every 7 (seven) days.  Dispense: 4 capsule; Refill: 0 ? ?2. Other iron deficiency anemia ?Orders and follow up as documented in patient record. We will refill Iron for 1 month with no refills.  ? ?Counseling ?Iron is essential for our bodies to make  red blood cells.  Reasons that someone may be deficient include: an iron-deficient diet (more likely in those following vegan or vegetarian diets), women with heavy menses, patients with GI disorders or poor absorption, patients that have had bariatric surgery, frequent blood donors, patients with cancer, and patients with heart disease.   ?An iron supplement has been recommended. This is found over-the-counter.  ?Iron-rich foods include dark leafy greens, red and white meats, eggs, seafood, and beans.   ?Certain foods and drinks prevent your body from absorbing iron properly. Avoid eating these foods in the same meal as iron-rich foods or with iron supplements. These foods include: coffee, black tea, and red wine; milk, dairy products, and foods that are high in calcium; beans and soybeans; whole grains.  ?Constipation can be a side effect of iron supplementation. Increased water and fiber intake are helpful. Water goal: > 2 liters/day. Fiber goal: > 25 grams/day.  ?- iron polysaccharides (NU-IRON) 150 MG capsule; Take 1 capsule (150 mg total) by mouth daily.  Dispense: 30 capsule; Refill: 0 ? ?3. At risk for impaired metabolic function ?[name] was given approximately 15 minutes of impaired  metabolic function prevention counseling today. We discussed intensive lifestyle modifications today with an emphasis on specific nutrition and exercise instructions and strategies.  ? ?Repetitive spaced learning was employed today to elicit superior memory formation and behavioral change.  ? ?4. Obesity, current BMI 41.34 ?Jacqueline Griffin is currently in the action stage of change. As such, her goal is to continue  with weight loss efforts. She has agreed to the Category 3 Plan and keeping a food journal and adhering to recommended goals of 500-600 calories and 45 grams of  protein at supper and 450-550 calories and 40 grams of protein at lunch.  ? ?Jacqueline Griffin will break up meal and eat every 2 hours.  ? ?Exercise goals:  Jacqueline Griffin will  start walking 15 minutes daily.  ? ?Behavioral modification strategies: increasing lean protein intake and meal planning and cooking strategies. ? ?Jacqueline Griffin has agreed to follow-up with our clinic in 2 weeks. She was informed of the importance of frequent follow-up visits to maximize her success with intensive lifestyle modifications for her multiple health conditions.  ? ?Objective:  ? ?Blood pressure (!) 142/75, pulse 87, temperature (!) 97.4 ?F (36.3 ?C), height 5' 6"  (1.676 m), weight 256 lb (116.1 kg), SpO2 98 %. ?Body mass index is 41.32 kg/m?. ? ?General: Cooperative, alert, well developed, in no acute distress. ?HEENT: Conjunctivae and lids unremarkable. ?Cardiovascular: Regular rhythm.  ?Lungs: Normal work of breathing. ?Neurologic: No focal deficits.  ? ?Lab Results  ?Component Value Date  ? CREATININE 0.57 05/06/2021  ? BUN 8 05/06/2021  ? NA 140 05/06/2021  ? K 4.1 05/06/2021  ? CL 102 05/06/2021  ? CO2 21 05/06/2021  ? ?Lab Results  ?Component Value Date  ? ALT 6 05/06/2021  ? AST 17 05/06/2021  ? ALKPHOS 94 05/06/2021  ? BILITOT 0.6 05/06/2021  ? ?Lab Results  ?Component Value Date  ? HGBA1C 5.5 05/06/2021  ? HGBA1C 5.5 10/06/2020  ? ?Lab Results  ?Component Value Date  ? INSULIN 13.5 05/06/2021  ? INSULIN 22.0 10/06/2020  ? ?Lab Results  ?Component Value Date  ? TSH 1.490 10/06/2020  ? ?Lab Results  ?Component Value Date  ? CHOL 184 05/06/2021  ? HDL 49 05/06/2021  ? LDLCALC 117 (H) 05/06/2021  ? TRIG 98 05/06/2021  ? CHOLHDL 3.8 05/06/2021  ? ?Lab Results  ?Component Value Date  ? VD25OH 29.3 (L) 05/06/2021  ? VD25OH 16.0 (L) 10/06/2020  ? ?Lab Results  ?Component Value Date  ? WBC 8.6 05/06/2021  ? HGB 10.7 (L) 05/06/2021  ? HCT 35.9 05/06/2021  ? MCV 74 (L) 05/06/2021  ? PLT 375 05/06/2021  ? ?Lab Results  ?Component Value Date  ? IRON 22 (L) 05/06/2021  ? TIBC 369 05/06/2021  ? FERRITIN 7 (L) 05/06/2021  ? ?Attestation Statements:  ? ?Reviewed by clinician on day of visit: allergies, medications,  problem list, medical history, surgical history, family history, social history, and previous encounter notes. ? ?I, Tonye Pearson, am acting as Location manager for [employer], PA-C. ? ?I have reviewed the above documentation for accuracy and completeness, and I agree with the above. Abby Potash, PA-C ? ?

## 2021-06-09 ENCOUNTER — Ambulatory Visit: Payer: BC Managed Care – PPO | Admitting: Physical Therapy

## 2021-06-10 DIAGNOSIS — Z4509 Encounter for adjustment and management of other cardiac device: Secondary | ICD-10-CM | POA: Diagnosis not present

## 2021-06-10 DIAGNOSIS — I493 Ventricular premature depolarization: Secondary | ICD-10-CM | POA: Diagnosis not present

## 2021-06-16 ENCOUNTER — Ambulatory Visit: Payer: BC Managed Care – PPO | Admitting: Physical Therapy

## 2021-06-21 ENCOUNTER — Other Ambulatory Visit (INDEPENDENT_AMBULATORY_CARE_PROVIDER_SITE_OTHER): Payer: Self-pay | Admitting: Physician Assistant

## 2021-06-21 DIAGNOSIS — D508 Other iron deficiency anemias: Secondary | ICD-10-CM

## 2021-06-23 ENCOUNTER — Ambulatory Visit (INDEPENDENT_AMBULATORY_CARE_PROVIDER_SITE_OTHER): Payer: BC Managed Care – PPO | Admitting: Physician Assistant

## 2021-06-23 ENCOUNTER — Encounter: Payer: BC Managed Care – PPO | Admitting: Physical Therapy

## 2021-06-23 DIAGNOSIS — E041 Nontoxic single thyroid nodule: Secondary | ICD-10-CM | POA: Diagnosis not present

## 2021-06-23 DIAGNOSIS — I493 Ventricular premature depolarization: Secondary | ICD-10-CM | POA: Diagnosis not present

## 2021-06-23 DIAGNOSIS — Z4509 Encounter for adjustment and management of other cardiac device: Secondary | ICD-10-CM | POA: Diagnosis not present

## 2021-06-23 DIAGNOSIS — R55 Syncope and collapse: Secondary | ICD-10-CM | POA: Diagnosis not present

## 2021-06-23 DIAGNOSIS — Z880 Allergy status to penicillin: Secondary | ICD-10-CM | POA: Diagnosis not present

## 2021-06-23 DIAGNOSIS — Z6841 Body Mass Index (BMI) 40.0 and over, adult: Secondary | ICD-10-CM | POA: Diagnosis not present

## 2021-06-23 DIAGNOSIS — Z95818 Presence of other cardiac implants and grafts: Secondary | ICD-10-CM | POA: Diagnosis not present

## 2021-06-23 DIAGNOSIS — R002 Palpitations: Secondary | ICD-10-CM | POA: Diagnosis not present

## 2021-06-23 DIAGNOSIS — I471 Supraventricular tachycardia: Secondary | ICD-10-CM | POA: Diagnosis not present

## 2021-06-23 DIAGNOSIS — Z95810 Presence of automatic (implantable) cardiac defibrillator: Secondary | ICD-10-CM | POA: Diagnosis not present

## 2021-06-23 DIAGNOSIS — Z881 Allergy status to other antibiotic agents status: Secondary | ICD-10-CM | POA: Diagnosis not present

## 2021-06-23 DIAGNOSIS — F419 Anxiety disorder, unspecified: Secondary | ICD-10-CM | POA: Diagnosis not present

## 2021-06-30 ENCOUNTER — Ambulatory Visit: Payer: BC Managed Care – PPO | Admitting: Physical Therapy

## 2021-07-02 ENCOUNTER — Ambulatory Visit: Payer: BC Managed Care – PPO | Attending: Family Medicine | Admitting: Physical Therapy

## 2021-07-02 DIAGNOSIS — M6281 Muscle weakness (generalized): Secondary | ICD-10-CM | POA: Insufficient documentation

## 2021-07-02 DIAGNOSIS — M25512 Pain in left shoulder: Secondary | ICD-10-CM | POA: Diagnosis not present

## 2021-07-02 NOTE — Therapy (Addendum)
[place] ?[facility] ?[address] ?[place], [place], [address] ?Phone: [phone]   Fax:  [phone] ? ?Physical Therapy Treatment and Discharge ? ?Patient Details  ?Name: Jacqueline Griffin ?MRN: [id number] ?Date of Birth: 1975/06/09 ?Referring Provider (PT): Lynne Leader ? ? ?Encounter Date: 07/02/2021 ? ? PT End of Session - 07/02/21 1941   ? ? Visit Number 3   ? Number of Visits 6   ? Date for PT Re-Evaluation 07/07/21   ? PT Start Time 0845   ? PT Stop Time [phone]   ? PT Time Calculation (min) 38 min   ? Activity Tolerance Patient tolerated treatment well   ? Behavior During Therapy [facility] for tasks assessed/performed   ? ?  ?  ? ?  ? ? ?Past Medical History:  ?Diagnosis Date  ? Anemia   ? Anxiety   ? Chronic GERD   ? Depression   ? Fatty liver   ? Food allergy   ? Hypertension 09/18/2017  ? Iron deficiency   ? Joint pain   ? Morbid obesity ([place]) 04/01/2012  ? NASH (nonalcoholic steatohepatitis) 11/28/2012  ? OSA (obstructive sleep apnea)   ? OSA on CPAP 04/29/2016  ? Palpitations   ? Paroxysmal supraventricular tachycardia ([place]) 04/01/2009  ? Overview:   ? Supraventricular tachycardia ([place]) 1997  ? Symptomatic PVCs   ? Thyroid nodule 11/07/2014  ? Uterine fibroid   ? ? ?Past Surgical History:  ?Procedure Laterality Date  ? CESAREAN SECTION    ? HIP SURGERY    ? MANDIBLE RECONSTRUCTION  46 yo  ? ? ?There were no vitals filed for this visit. ? ? Subjective Assessment - 07/02/21 0848   ? ? Subjective Pt states she feels her shoulder is better. "Not 100%, but much better"   ? Patient Stated Goals decrease pain and tightness   ? Currently in Pain? No/denies   ? ?  ?  ? ?  ? ? ? ? ? [facility] PT Assessment - 07/02/21 0001   ? ?  ? Assessment  ? Medical Diagnosis Left shoulder pain   ? Referring Provider (PT) Lynne Leader   ? Onset Date/Surgical Date 03/29/21   ? Hand Dominance Right   ? Next MD Visit PRN   ? Prior Therapy for Rt shoulder   ?  ? Precautions  ? Precautions None   ?  Precaution Comments implanted heart monitor   ?  ? AROM  ? Left Shoulder Flexion 140 Degrees   ? Left Shoulder ABduction 145 Degrees   ? Left Shoulder Internal Rotation 90 Degrees   ? Left Shoulder External Rotation 90 Degrees   ? ?  ?  ? ?  ? ? ? ? ? ? ? ? ? ? ? ? ? ? ? ? [facility] Adult PT Treatment/Exercise - 07/02/21 0001   ? ?  ? Shoulder Exercises: Supine  ? Protraction 20 reps;Left   ? Protraction Weight (lbs) 2#   ?  ? Shoulder Exercises: Standing  ? Extension 20 reps   ? Theraband Level (Shoulder Extension) Level 3 (Green)   ? Row 20 reps   ? Theraband Level (Shoulder Row) Level 3 (Green)   ? Other Standing Exercises against pool noodle "w" with red TB, diagonals with no resistance   ?  ? Shoulder Exercises: Pulleys  ? Flexion 1 minute   ? Scaption 1 minute   ?  ? Shoulder Exercises: Stretch  ? Other Shoulder Stretches doorway stretch at  90 2 x20, Lt ER stretch at doorway 2 x 20sec   ? Other Shoulder Stretches supine shoulder flexion stretch with cane x 1 min hold   ?  ? Manual Therapy  ? Manual therapy comments STM, TPR Lt subscap, pecs   ? ?  ?  ? ?  ? ? ? ? ? ? ? ? ? ? PT Education - 07/02/21 0923   ? ? Education Details updated HEP   ? Person(s) Educated Patient   ? Methods Explanation;Demonstration;Handout   ? Comprehension Returned demonstration;Verbalized understanding   ? ?  ?  ? ?  ? ? ? ? ? ? PT Long Term Goals - 05/26/21 1642   ? ?  ? PT LONG TERM GOAL #1  ? Title Pt will be independent with HEP   ? Time 6   ? Period Weeks   ? Status New   ? Target Date 07/07/21   ?  ? PT LONG TERM GOAL #2  ? Title Pt will improve FOTO to >= 71 to demo improved functional mobility   ? Time 6   ? Period Weeks   ? Status New   ? Target Date 07/07/21   ?  ? PT LONG TERM GOAL #3  ? Title Pt will report decrease in pain with lifting objects overhead to <= 2/10   ? Time 6   ? Period Weeks   ? Status New   ? Target Date 07/07/21   ?  ? PT LONG TERM GOAL #4  ? Title Pt will tolerate sleeping on Lt side with pain <=2/10   ?  Time 6   ? Period Weeks   ? Status New   ? Target Date 07/07/21   ? ?  ?  ? ?  ? ? ? ? ? ? ? ? Plan - 07/02/21 0923   ? ? Clinical Impression Statement Lt shoulder with improved ROM in ER/IR, still with limitations in flexion and abduction. Pt responds well to stretching and manual to improve ROM and reduce mm spasticity. Pt challenged by D2 flexion, requiring rest breaks due to mm fatigue. HEP updated.   ? PT Next Visit Plan RECERT! Lt shoulder ROM and strength   ? PT Home Exercise Plan [phone]   ? Consulted and Agree with Plan of Care Patient   ? ?  ?  ? ?  ? ? ?Patient will benefit from skilled therapeutic intervention in order to improve the following deficits and impairments:    ? ?Visit Diagnosis: ?Muscle weakness (generalized) ? ?Left shoulder pain, unspecified chronicity ? ? ? ? ?Problem List ?Patient Active Problem List  ? Diagnosis Date Noted  ? Vitamin D deficiency 10/07/2020  ? Iron deficiency anemia 10/07/2020  ? Hearing loss 09/18/2017  ? Heart burn 09/18/2017  ? Hypertension 09/18/2017  ? OSA on CPAP 04/29/2016  ? Thyroid nodule 11/07/2014  ? NASH (nonalcoholic steatohepatitis) 11/28/2012  ? Morbid obesity ([place]) 04/01/2012  ? Menorrhagia 09/25/2010  ? Paroxysmal supraventricular tachycardia ([place]) 04/01/2009  ? ?PHYSICAL THERAPY DISCHARGE SUMMARY ? ?Visits from Start of Care: 3 ? ?Current functional level related to goals / functional outcomes: ?Improved strength, decreased pain ?  ?Remaining deficits: ?See above ?  ?Education / Equipment: ?HEP  ? ?Patient agrees to discharge. Patient goals were partially met. Patient is being discharged due to not returning since the last visit. ?Isabelle Course, PT,DPT04/18/2310:33 AM ? ? ?Shone Leventhal, PT ?07/02/2021, 9:25 AM ? ? ?[facility] ?[place]  [address] ?[place], [place], [address] ?Phone: [phone]   Fax:  [phone] ? ?Name: Jacqueline Griffin ?MRN: [id number] ?Date of Birth: 1975-12-31 ? ? ? ?

## 2021-07-02 NOTE — Patient Instructions (Signed)
Access Code: [email or web address] ?URL: [email or web address] ?Date: 07/02/2021 ?Prepared by: Isabelle Course ? ?Exercises ?- Doorway Pec Stretch at 90 Degrees Abduction  - 1 x daily - 7 x weekly - 1 sets - 3 reps - 20-30 sec hold ?- Bicep Stretch at Table  - 1 x daily - 7 x weekly - 1 sets - 3 reps - 20-30 sec hold ?- Standing Bilateral Low Shoulder Row with Anchored Resistance  - 1 x daily - 7 x weekly - 3 sets - 10 reps ?- Shoulder extension with resistance - Neutral  - 1 x daily - 7 x weekly - 3 sets - 10 reps ?- Standing Shoulder External Rotation with Resistance  - 1 x daily - 7 x weekly - 3 sets - 10 reps ?- Low Trap Setting at English 1 x daily - 7 x weekly - 3 sets - 10 reps ?- Standing Shoulder External Rotation Stretch in Doorway  - 1 x daily - 7 x weekly - 1 sets - 3 reps - 20-30 sec hold ?- Shoulder PNF D2 Flexion  - 1 x daily - 7 x weekly - 2 sets - 10 reps ?

## 2021-07-03 DIAGNOSIS — G4733 Obstructive sleep apnea (adult) (pediatric): Secondary | ICD-10-CM | POA: Diagnosis not present

## 2021-07-08 ENCOUNTER — Ambulatory Visit: Payer: BC Managed Care – PPO | Admitting: Physical Therapy

## 2021-07-08 ENCOUNTER — Ambulatory Visit (INDEPENDENT_AMBULATORY_CARE_PROVIDER_SITE_OTHER): Payer: BC Managed Care – PPO | Admitting: Physician Assistant

## 2021-07-08 DIAGNOSIS — D219 Benign neoplasm of connective and other soft tissue, unspecified: Secondary | ICD-10-CM | POA: Diagnosis not present

## 2021-07-08 DIAGNOSIS — Z01419 Encounter for gynecological examination (general) (routine) without abnormal findings: Secondary | ICD-10-CM | POA: Diagnosis not present

## 2021-07-08 DIAGNOSIS — Z6841 Body Mass Index (BMI) 40.0 and over, adult: Secondary | ICD-10-CM | POA: Diagnosis not present

## 2021-07-12 ENCOUNTER — Ambulatory Visit (INDEPENDENT_AMBULATORY_CARE_PROVIDER_SITE_OTHER): Payer: BC Managed Care – PPO | Admitting: Physician Assistant

## 2021-07-12 DIAGNOSIS — I493 Ventricular premature depolarization: Secondary | ICD-10-CM | POA: Diagnosis not present

## 2021-07-12 DIAGNOSIS — Z4509 Encounter for adjustment and management of other cardiac device: Secondary | ICD-10-CM | POA: Diagnosis not present

## 2021-07-14 ENCOUNTER — Ambulatory Visit (INDEPENDENT_AMBULATORY_CARE_PROVIDER_SITE_OTHER): Payer: BC Managed Care – PPO | Admitting: Family Medicine

## 2021-07-14 ENCOUNTER — Encounter (INDEPENDENT_AMBULATORY_CARE_PROVIDER_SITE_OTHER): Payer: Self-pay | Admitting: Family Medicine

## 2021-07-14 VITALS — BP 133/71 | HR 77 | Temp 98.1°F | Ht 66.0 in | Wt 255.0 lb

## 2021-07-14 DIAGNOSIS — E559 Vitamin D deficiency, unspecified: Secondary | ICD-10-CM | POA: Diagnosis not present

## 2021-07-14 DIAGNOSIS — E669 Obesity, unspecified: Secondary | ICD-10-CM

## 2021-07-14 DIAGNOSIS — F419 Anxiety disorder, unspecified: Secondary | ICD-10-CM

## 2021-07-14 DIAGNOSIS — D508 Other iron deficiency anemias: Secondary | ICD-10-CM

## 2021-07-14 DIAGNOSIS — Z6841 Body Mass Index (BMI) 40.0 and over, adult: Secondary | ICD-10-CM

## 2021-07-14 MED ORDER — POLYSACCHARIDE IRON COMPLEX 150 MG PO CAPS: 150.0000 mg | ORAL_CAPSULE | Freq: Every day | ORAL | 0 refills | Status: DC

## 2021-07-14 MED ORDER — VITAMIN D (ERGOCALCIFEROL) 1.25 MG (50000 UNIT) PO CAPS: 50000.0000 [IU] | ORAL_CAPSULE | ORAL | 0 refills | Status: DC

## 2021-07-15 ENCOUNTER — Telehealth: Payer: Self-pay | Admitting: Hematology

## 2021-07-15 DIAGNOSIS — I493 Ventricular premature depolarization: Secondary | ICD-10-CM | POA: Diagnosis not present

## 2021-07-15 DIAGNOSIS — I471 Supraventricular tachycardia: Secondary | ICD-10-CM | POA: Diagnosis not present

## 2021-07-15 NOTE — Telephone Encounter (Signed)
Scheduled appt per 4/19 referral. Pt is aware of appt date and time. Pt is aware to arrive 15 mins prior to appt time and to bring and updated insurance card. Pt is aware of appt location.   ?

## 2021-07-25 DIAGNOSIS — Z4509 Encounter for adjustment and management of other cardiac device: Secondary | ICD-10-CM | POA: Diagnosis not present

## 2021-07-25 DIAGNOSIS — R55 Syncope and collapse: Secondary | ICD-10-CM | POA: Diagnosis not present

## 2021-07-25 DIAGNOSIS — R002 Palpitations: Secondary | ICD-10-CM | POA: Diagnosis not present

## 2021-07-25 NOTE — Progress Notes (Signed)
Chief Complaint:   OBESITY Jacqueline Griffin is here to discuss her progress with her obesity treatment plan along with follow-up of her obesity related diagnoses. Jacqueline Griffin is on the Category 3 Plan and keeping a food journal and adhering to recommended goals of 450 to 550 calories and 40 grams of protein for lunch and 500 to 600 calories and 45 grams of protein for supper and states she is following her eating plan approximately 5% of the time. Jacqueline Griffin states she is not exercising.  Today's visit was #: 12 Starting weight: 263 lbs Starting date: 10/06/2020 Today's weight: 255 lbs Today's date: 07/14/2021 Total lbs lost to date: 8 Total lbs lost since last in-office visit: 1  Interim History: Jacqueline Griffin felt a bit overwhelmed since her last appointment. 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 Jacqueline Griffin started with a reset this week. She is having cereal for breakfast and [employer] for lunch with extra protein. Supper is hit or miss and sometimes she won't eat.  Subjective:   1. Vitamin D deficiency [name] has a vitamin D level of 29.3 and she is on prescription vitamin D. She notes fatigue and denies nausea, vomiting, or muscle weakness.  2. Other iron deficiency anemia Jacqueline Griffin is on PO iron, but is not consistent. She has had significant anemia for the past 11+ years. Jacqueline Griffin's MCV and Hgb are decreased and her reticulocyte count is elevated.  3. Anxiety [name] is having significant feelings of being overwhelmed and she is not on medication.  Assessment/Plan:   1. Vitamin D deficiency Jacqueline Griffin agrees to continue taking prescription vitamin D and will follow up as directed.  - Vitamin D, Ergocalciferol, (DRISDOL) 1.25 MG (50000 UNIT) CAPS capsule; Take 1 capsule (50,000 Units total) by mouth every 7 (seven) days.  Dispense: 4 capsule; Refill: 0  2. Other iron deficiency anemia Jacqueline Griffin agrees to a referral to Hematology.  - iron polysaccharides (NU-IRON) 150 MG capsule; Take  1 capsule (150 mg total) by mouth daily.  Dispense: 30 capsule; Refill: 0 - Ambulatory referral to Hematology / Oncology  3. Anxiety Jacqueline Griffin agrees to talk to cardiology about Sertraline and to follow up as directed.  4. Obesity with current BMI of 41.2 Jacqueline Griffin is currently in the action stage of change. As such, her goal is to continue with weight loss efforts. She has agreed to keeping a food journal and adhering to recommended goals of 1600 to 1700 calories and 95+ grams of protein.   Exercise goals: All adults should avoid inactivity. Some physical activity is better than none, and adults who participate in any amount of physical activity gain some health benefits.  Behavioral modification strategies: increasing lean protein intake, meal planning and cooking strategies, keeping healthy foods in the home, and planning for success.  Jacqueline Griffin has agreed to follow-up with our clinic in 4 weeks. She was informed of the importance of frequent follow-up visits to maximize her success with intensive lifestyle modifications for her multiple health conditions.   Objective:   Blood pressure 133/71, pulse 77, temperature 98.1 F (36.7 C), height 5' 6"  (1.676 m), weight 255 lb (115.7 kg), SpO2 98 %. Body mass index is 41.16 kg/m.  General: Cooperative, alert, well developed, in no acute distress. HEENT: Conjunctivae and lids unremarkable. Cardiovascular: Regular rhythm.  Lungs: Normal work of breathing. Neurologic: No focal deficits.   Lab Results  Component Value Date   CREATININE 0.57 05/06/2021   BUN 8 05/06/2021   NA 140 05/06/2021  K 4.1 05/06/2021   CL 102 05/06/2021   CO2 21 05/06/2021   Lab Results  Component Value Date   ALT 6 05/06/2021   AST 17 05/06/2021   ALKPHOS 94 05/06/2021   BILITOT 0.6 05/06/2021   Lab Results  Component Value Date   HGBA1C 5.5 05/06/2021   HGBA1C 5.5 10/06/2020   Lab Results  Component Value Date   INSULIN 13.5 05/06/2021   INSULIN 22.0  10/06/2020   Lab Results  Component Value Date   TSH 1.490 10/06/2020   Lab Results  Component Value Date   CHOL 184 05/06/2021   HDL 49 05/06/2021   LDLCALC 117 (H) 05/06/2021   TRIG 98 05/06/2021   CHOLHDL 3.8 05/06/2021   Lab Results  Component Value Date   VD25OH 29.3 (L) 05/06/2021   VD25OH 16.0 (L) 10/06/2020   Lab Results  Component Value Date   WBC 8.6 05/06/2021   HGB 10.7 (L) 05/06/2021   HCT 35.9 05/06/2021   MCV 74 (L) 05/06/2021   PLT 375 05/06/2021   Lab Results  Component Value Date   IRON 22 (L) 05/06/2021   TIBC 369 05/06/2021   FERRITIN 7 (L) 05/06/2021   Attestation Statements:   Reviewed by clinician on day of visit: allergies, medications, problem list, medical history, surgical history, family history, social history, and previous encounter notes.  IMarcille Blanco, CMA, am acting as transcriptionist for Coralie Common, MD  I have reviewed the above documentation for accuracy and completeness, and I agree with the above. - Coralie Common, MD

## 2021-08-03 DIAGNOSIS — G4733 Obstructive sleep apnea (adult) (pediatric): Secondary | ICD-10-CM | POA: Diagnosis not present

## 2021-08-06 ENCOUNTER — Other Ambulatory Visit (INDEPENDENT_AMBULATORY_CARE_PROVIDER_SITE_OTHER): Payer: Self-pay | Admitting: Family Medicine

## 2021-08-06 DIAGNOSIS — D508 Other iron deficiency anemias: Secondary | ICD-10-CM

## 2021-08-09 ENCOUNTER — Ambulatory Visit (INDEPENDENT_AMBULATORY_CARE_PROVIDER_SITE_OTHER): Payer: BC Managed Care – PPO | Admitting: Physician Assistant

## 2021-08-11 ENCOUNTER — Ambulatory Visit (INDEPENDENT_AMBULATORY_CARE_PROVIDER_SITE_OTHER): Payer: BC Managed Care – PPO | Admitting: Family Medicine

## 2021-08-11 ENCOUNTER — Encounter (INDEPENDENT_AMBULATORY_CARE_PROVIDER_SITE_OTHER): Payer: Self-pay | Admitting: Family Medicine

## 2021-08-11 VITALS — BP 114/70 | HR 84 | Temp 98.1°F | Ht 66.0 in | Wt 256.0 lb

## 2021-08-11 DIAGNOSIS — E559 Vitamin D deficiency, unspecified: Secondary | ICD-10-CM | POA: Diagnosis not present

## 2021-08-11 DIAGNOSIS — E669 Obesity, unspecified: Secondary | ICD-10-CM

## 2021-08-11 DIAGNOSIS — Z6841 Body Mass Index (BMI) 40.0 and over, adult: Secondary | ICD-10-CM

## 2021-08-11 DIAGNOSIS — F419 Anxiety disorder, unspecified: Secondary | ICD-10-CM

## 2021-08-11 DIAGNOSIS — D508 Other iron deficiency anemias: Secondary | ICD-10-CM

## 2021-08-11 MED ORDER — POLYSACCHARIDE IRON COMPLEX 150 MG PO CAPS: 150.0000 mg | ORAL_CAPSULE | Freq: Every day | ORAL | 0 refills | Status: DC

## 2021-08-11 MED ORDER — SERTRALINE HCL 50 MG PO TABS: 50.0000 mg | ORAL_TABLET | Freq: Every day | ORAL | 0 refills | Status: DC

## 2021-08-11 MED ORDER — VITAMIN D (ERGOCALCIFEROL) 1.25 MG (50000 UNIT) PO CAPS: 50000.0000 [IU] | ORAL_CAPSULE | ORAL | 0 refills | Status: DC

## 2021-08-16 ENCOUNTER — Inpatient Hospital Stay: Payer: BC Managed Care – PPO | Attending: Hematology | Admitting: Hematology

## 2021-08-16 ENCOUNTER — Inpatient Hospital Stay: Payer: BC Managed Care – PPO

## 2021-08-16 ENCOUNTER — Other Ambulatory Visit: Payer: Self-pay

## 2021-08-16 VITALS — BP 149/77 | HR 72 | Temp 97.7°F | Resp 20 | Wt 261.2 lb

## 2021-08-16 DIAGNOSIS — Z79899 Other long term (current) drug therapy: Secondary | ICD-10-CM | POA: Insufficient documentation

## 2021-08-16 DIAGNOSIS — D509 Iron deficiency anemia, unspecified: Secondary | ICD-10-CM | POA: Insufficient documentation

## 2021-08-16 DIAGNOSIS — I10 Essential (primary) hypertension: Secondary | ICD-10-CM | POA: Insufficient documentation

## 2021-08-16 DIAGNOSIS — N920 Excessive and frequent menstruation with regular cycle: Secondary | ICD-10-CM | POA: Insufficient documentation

## 2021-08-16 DIAGNOSIS — D508 Other iron deficiency anemias: Secondary | ICD-10-CM

## 2021-08-16 LAB — CMP (CANCER CENTER ONLY)
ALT: 6 U/L (ref 0–44)
AST: 13 U/L — ABNORMAL LOW (ref 15–41)
Albumin: 3.9 g/dL (ref 3.5–5.0)
Alkaline Phosphatase: 84 U/L (ref 38–126)
Anion gap: 5 (ref 5–15)
BUN: 8 mg/dL (ref 6–20)
CO2: 29 mmol/L (ref 22–32)
Calcium: 9 mg/dL (ref 8.9–10.3)
Chloride: 104 mmol/L (ref 98–111)
Creatinine: 0.55 mg/dL (ref 0.44–1.00)
GFR, Estimated: >60 mL/min (ref >60)
Glucose, Bld: 87 mg/dL (ref 70–99)
Potassium: 3.8 mmol/L (ref 3.5–5.1)
Sodium: 138 mmol/L (ref 135–145)
Total Bilirubin: 0.6 mg/dL (ref 0.3–1.2)
Total Protein: 8.4 g/dL — ABNORMAL HIGH (ref 6.5–8.1)

## 2021-08-16 LAB — CBC WITH DIFFERENTIAL/PLATELET
Abs Immature Granulocytes: 0.03 10*3/uL (ref 0.00–0.07)
Basophils Absolute: 0 10*3/uL (ref 0.0–0.1)
Basophils Relative: 0 %
Eosinophils Absolute: 0.2 10*3/uL (ref 0.0–0.5)
Eosinophils Relative: 2 %
HCT: 34.2 % — ABNORMAL LOW (ref 36.0–46.0)
Hemoglobin: 10.7 g/dL — ABNORMAL LOW (ref 12.0–15.0)
Immature Granulocytes: 0 %
Lymphocytes Relative: 22 %
Lymphs Abs: 2.3 10*3/uL (ref 0.7–4.0)
MCH: 22 pg — ABNORMAL LOW (ref 26.0–34.0)
MCHC: 31.3 g/dL (ref 30.0–36.0)
MCV: 70.2 fL — ABNORMAL LOW (ref 80.0–100.0)
Monocytes Absolute: 0.6 10*3/uL (ref 0.1–1.0)
Monocytes Relative: 6 %
Neutro Abs: 7 10*3/uL (ref 1.7–7.7)
Neutrophils Relative %: 70 %
Platelets: 358 10*3/uL (ref 150–400)
RBC: 4.87 MIL/uL (ref 3.87–5.11)
RDW: 18.7 % — ABNORMAL HIGH (ref 11.5–15.5)
WBC: 10.2 10*3/uL (ref 4.0–10.5)
nRBC: 0 % (ref 0.0–0.2)

## 2021-08-16 LAB — IRON AND IRON BINDING CAPACITY (CC-WL,HP ONLY)
Iron: 24 ug/dL — ABNORMAL LOW (ref 28–170)
Saturation Ratios: 5 % — ABNORMAL LOW (ref 10.4–31.8)
TIBC: 454 ug/dL — ABNORMAL HIGH (ref 250–450)
UIBC: 430 ug/dL (ref 148–442)

## 2021-08-16 LAB — FERRITIN: Ferritin: 5 ng/mL — ABNORMAL LOW (ref 11–307)

## 2021-08-16 NOTE — Progress Notes (Signed)
HEMATOLOGY/ONCOLOGY CONSULTATION NOTE  Date of Service: 08/16/2021  Patient Care Team: Pcp, No as PCP - General  CHIEF COMPLAINTS/PURPOSE OF CONSULTATION:  Evaluation and consultation for iron deficiency anemia.  HISTORY OF PRESENTING ILLNESS:   Jacqueline Griffin is a wonderful 46 y.o. female who has been referred to [place] by her Dr. Jearld Shines, Earvin Hansen, MD for evaluation and consultation for iron deficiency anemia. She reports She is doing well.  She notes heavy periods. She notes that in February of 2023 she was menstruating for almost a month. She reports excessive clotting during menstruation. She notes some fibroids.  We discussed that iron deficiency might be related to heavier menstruation.  She notes that she recently started taking the iron polysaccharide 150 mg 1x p.o daily. She says she does not like them as they change her bowel habits and give her some abdominal discomfort. We discussed trying the liquid form of iron polysaccharide and reducing frequency of intake to help with abdominal pain.   She reports some fatigue. She says that she is not able to move as quickly as she generally would.  She reports symptoms of PICA saying that she has been eating ice for the last 6-7 months.   We discussed getting IV iron such as Venofer of Injectafer based on labs during next visit.  We further discussed potential for alpha thalassemia trait.  No smoking or alcohol.  No fever, chills, or night sweats. No other new changes in bowel habits. No other new or acute focal symptoms.  Labs done 05/06/2021 were reviewed in detail. CBC stable.  CMP unremarkable. Iron sat ratio is 6%. Ferritin is 7.  MEDICAL HISTORY:  Past Medical History:  Diagnosis Date   Anemia    Anxiety    Chronic GERD    Depression    Fatty liver    Food allergy    Hypertension 09/18/2017   Iron deficiency    Joint pain    Morbid obesity ([place]) 04/01/2012   NASH (nonalcoholic steatohepatitis)  11/28/2012   OSA (obstructive sleep apnea)    OSA on CPAP 04/29/2016   Palpitations    Paroxysmal supraventricular tachycardia ([place]) 04/01/2009   Overview:    Supraventricular tachycardia ([place]) 1997   Symptomatic PVCs    Thyroid nodule 11/07/2014   Uterine fibroid     SURGICAL HISTORY: Past Surgical History:  Procedure Laterality Date   CESAREAN SECTION     HIP SURGERY     MANDIBLE RECONSTRUCTION  46 yo    SOCIAL HISTORY: Social History   Socioeconomic History   Marital status: Single    Spouse name: Not on file   Number of children: Not on file   Years of education: Not on file   Highest education level: Not on file  Occupational History   Occupation: Sales executive Document Specialist - [employer]  Tobacco Use   Smoking status: Never   Smokeless tobacco: Never  Vaping Use   Vaping Use: Never used  Substance and Sexual Activity   Alcohol use: No   Drug use: No   Sexual activity: Yes    Birth control/protection: Condom  Other Topics Concern   Not on file  Social History Narrative   Not on file   Social Determinants of Health   Financial Resource Strain: Not on file  Food Insecurity: Not on file  Transportation Needs: Not on file  Physical Activity: Not on file  Stress: Not on file  Social Connections: Not on file  Intimate  Partner Violence: Not on file    FAMILY HISTORY: Family History  Problem Relation Age of Onset   Leukemia Mother    Hypertension Father    Diabetes Father    Heart disease Father    Kidney disease Father    Sleep apnea Father    Drug abuse Father    Obesity Father     ALLERGIES:  is allergic to shrimp [shellfish allergy], amoxicillin, cephalosporins, ciprofloxacin, clindamycin/lincomycin, doxycycline, and shrimp flavor.  MEDICATIONS:  Current Outpatient Medications  Medication Sig Dispense Refill   iron polysaccharides (NU-IRON) 150 MG capsule Take 1 capsule (150 mg total) by mouth daily. 30 capsule 0   metoprolol succinate  (TOPROL-XL) 100 MG 24 hr tablet Take 100 mg by mouth daily. Take with or immediately following a meal.     sertraline (ZOLOFT) 50 MG tablet Take 1 tablet (50 mg total) by mouth daily. 30 tablet 0   Vitamin D, Ergocalciferol, (DRISDOL) 1.25 MG (50000 UNIT) CAPS capsule Take 1 capsule (50,000 Units total) by mouth every 7 (seven) days. 4 capsule 0   No current facility-administered medications for this visit.    REVIEW OF SYSTEMS:    10 Point review of Systems was done is negative except as noted above.  PHYSICAL EXAMINATION: ECOG PERFORMANCE STATUS: 1 - Symptomatic but completely ambulatory  . Vitals:   08/16/21 1058  BP: (!) 149/77  Pulse: 72  Resp: 20  Temp: 97.7 F (36.5 C)  SpO2: 99%   Filed Weights   08/16/21 1058  Weight: 261 lb 2.6 oz (118.5 kg)   .Body mass index is 42.15 kg/m. NAD GENERAL:alert, in no acute distress and comfortable SKIN: no acute rashes, no significant lesions EYES: conjunctiva are pink and non-injected, sclera anicteric NECK: supple, no JVD LYMPH:  no palpable lymphadenopathy in the cervical, axillary or inguinal regions LUNGS: clear to auscultation b/l with normal respiratory effort HEART: regular rate & rhythm ABDOMEN:  normoactive bowel sounds , non tender, not distended. Extremity: no pedal edema PSYCH: alert & oriented x 3 with fluent speech NEURO: no focal motor/sensory deficits  LABORATORY DATA:  I have reviewed the data as listed  .[name]    Latest Ref Rng & Units 08/16/2021   12:11 PM 05/06/2021    9:31 AM 10/06/2020    8:31 AM  CBC  WBC 4.0 - 10.5 K/uL 10.2   8.6   11.3    Hemoglobin 12.0 - 15.0 g/dL 10.7   10.7   10.3    Hematocrit 36.0 - 46.0 % 34.2   35.9   37.0    Platelets 150 - 400 K/uL 358   375   379     .    Latest Ref Rng & Units 08/16/2021   12:11 PM 05/06/2021    9:31 AM 10/06/2020    8:31 AM  CMP  Glucose 70 - 99 mg/dL 87   74   83    BUN 6 - 20 mg/dL 8   8   10     Creatinine 0.44 - 1.00 mg/dL 0.55   0.57   0.50     Sodium 135 - 145 mmol/L 138   140   136    Potassium 3.5 - 5.1 mmol/L 3.8   4.1   4.2    Chloride 98 - 111 mmol/L 104   102   101    CO2 22 - 32 mmol/L 29   21   21     Calcium 8.9 - 10.3 mg/dL  9.0   9.0   8.9    Total Protein 6.5 - 8.1 g/dL 8.4   7.6   7.7    Total Bilirubin 0.3 - 1.2 mg/dL 0.6   0.6   0.6    Alkaline Phos 38 - 126 U/L 84   94   91    AST 15 - 41 U/L 13   17   13     ALT 0 - 44 U/L 6   6   8      . Lab Results  Component Value Date   IRON 24 (L) 08/16/2021   TIBC 454 (H) 08/16/2021   IRONPCTSAT 5 (L) 08/16/2021   (Iron and TIBC)  Lab Results  Component Value Date   FERRITIN 5 (L) 08/16/2021       RADIOGRAPHIC STUDIES: I have personally reviewed the radiological images as listed and agreed with the findings in the report. No results found.  ASSESSMENT & PLAN:   46 y.o. very pleasant female with  1.Microcytic Anemia likely due to  Iron deficiency anemia -Labs done 05/06/2021 show iron sat of 6 and ferritin of 7. 2. Relative polycythemia -- r/o concurrent hemoglobinopathy  Plan -I discussed available labs with the patient in details Labs done 05/06/2021 were reviewed in detail. CBC stable. CMP unremarkable. Iron sat ratio is 6%. Ferritin is 7. -Patient's chart reviewed in detail -We will get labs today to evaluate her current status further and for treatment planning. -We discussed trying the liquid form of iron polysaccharide and reducing frequency of intake to help with abdominal pain.  -We discussed getting IV iron such as Venofer of Injectafer based on labs during next visit. -Phone visit in 1 week with labs.  Follow up: Labs today Phone visit with Dr Irene Limbo in 1 week   . Orders Placed This Encounter  Procedures   CBC with Differential/Platelet    Standing Status:   Future    Number of Occurrences:   1    Standing Expiration Date:   08/17/2022   CMP ([place] only)    Standing Status:   Future    Number of Occurrences:   1     Standing Expiration Date:   08/17/2022   Alpha-Thalassemia GenotypR    Standing Status:   Future    Number of Occurrences:   1    Standing Expiration Date:   08/17/2022   Hgb Fractionation Cascade    Standing Status:   Future    Number of Occurrences:   1    Standing Expiration Date:   08/17/2022   Ferritin    Standing Status:   Future    Number of Occurrences:   1    Standing Expiration Date:   08/17/2022    All of the patients questions were answered with apparent satisfaction. The patient knows to call the clinic with any problems, questions or concerns.  I spent 30 minutes counseling the patient face to face. The total time spent in the appointment was 30mnutes and more than 50% was on counseling and direct patient cares.    GSullivan LoneMD MSeffnerAAHIVMS SFirsthealth Moore Regional Hospital - Hoke CampusCCentennial Asc LLCHematology/Oncology Physician CWilliam [facility] Sharpe Jr [facility] (Office):       [phone](Work cell):  [phone](Fax):           [phone]  I, GMelene Muller am acting as scribe for GFabienne Bruns MD.   .I have reviewed the above documentation for accuracy and completeness, and I agree with the above. .Suzan Slick  Juleen China MD

## 2021-08-18 DIAGNOSIS — R002 Palpitations: Secondary | ICD-10-CM | POA: Diagnosis not present

## 2021-08-18 DIAGNOSIS — Z4509 Encounter for adjustment and management of other cardiac device: Secondary | ICD-10-CM | POA: Diagnosis not present

## 2021-08-18 DIAGNOSIS — R55 Syncope and collapse: Secondary | ICD-10-CM | POA: Diagnosis not present

## 2021-08-18 LAB — HGB FRACTIONATION CASCADE
Hgb A2: 2.1 % (ref 1.8–3.2)
Hgb A: 97.9 % (ref 96.4–98.8)
Hgb F: 0 % (ref 0.0–2.0)
Hgb S: 0 %

## 2021-08-18 NOTE — Progress Notes (Signed)
Chief Complaint:   OBESITY Jacqueline Griffin is here to discuss her progress with her obesity treatment plan along with follow-up of her obesity related diagnoses. Jacqueline Griffin is on keeping a food journal and adhering to recommended goals of 1600-1700 calories and 95+ grams of protein and states she is following her eating plan approximately 20% of the time. Jacqueline Griffin states she is walking  20 minutes 3 times per week.  Today's visit was #: 30 Starting weight: 263 lbs Starting date: 10/06/2021 Today's weight: 256 lbs Today's date: 08/11/2021 Total lbs lost to date: 7 Total lbs lost since last in-office visit: 0  Interim History: Jacqueline Griffin was cleared by cardiology to start Zoloft. She has been trying to start healthier quicker options for food. She is doing Materials engineer for lunch, 300 cal (tries to focus on increase protein options). Using just bare chicken cutlets for supper options.  Subjective:   1. Vitamin D deficiency Jacqueline Griffin is currently taking prescription Vit D. Denies any nausea, vomiting or muscle weakness. She notes fatigue. Her last Vit D level of 29.3.  2. Other iron deficiency anemia Jacqueline Griffin's hemoglobin at 10.7, MCV 74, iron 22, ferritin 7, Iron sat 6.   3. Anxiety Jacqueline Griffin cleared by cardiology to start Sertraline. She is still experiencing significant anxiety.  Assessment/Plan:   1. Vitamin D deficiency We will refill Vit D 50K/IU weekly for 1 month with no refills.  -Refill Vitamin D, Ergocalciferol, (DRISDOL) 1.25 MG (50000 UNIT) CAPS capsule; Take 1 capsule (50,000 Units total) by mouth every 7 (seven) days.  Dispense: 4 capsule; Refill: 0  2. Other iron deficiency anemia Jacqueline Griffin will start Nu-iron 150 mg by mouth for 1 month with no refills.  -Start iron polysaccharides (NU-IRON) 150 MG capsule; Take 1 capsule (150 mg total) by mouth daily.  Dispense: 30 capsule; Refill: 0  3. Anxiety We will refill Sertraline 50 mg by mouth daily for 1 month with no refills.  -Refill  sertraline (ZOLOFT) 50 MG tablet; Take 1 tablet (50 mg total) by mouth daily.  Dispense: 30 tablet; Refill: 0  4. Obesity with current BMI of 41.4 Jacqueline Griffin is currently in the action stage of change. As such, her goal is to continue with weight loss efforts. She has agreed to keeping a food journal and adhering to recommended goals of 1600-1770 calories and 95 + grams of  protein daily.   Exercise goals: As is.  Behavioral modification strategies: increasing lean protein intake, meal planning and cooking strategies, keeping healthy foods in the home, and planning for success.  Jacqueline Griffin has agreed to follow-up with our clinic in 3 weeks. She was informed of the importance of frequent follow-up visits to maximize her success with intensive lifestyle modifications for her multiple health conditions.   Objective:   Blood pressure 114/70, pulse 84, temperature 98.1 F (36.7 C), height 5' 6"  (1.676 m), weight 256 lb (116.1 kg), last menstrual period 08/03/2021, SpO2 99 %. Body mass index is 41.32 kg/m.  General: Cooperative, alert, well developed, in no acute distress. HEENT: Conjunctivae and lids unremarkable. Cardiovascular: Regular rhythm.  Lungs: Normal work of breathing. Neurologic: No focal deficits.   Lab Results  Component Value Date   CREATININE 0.55 08/16/2021   BUN 8 08/16/2021   NA 138 08/16/2021   K 3.8 08/16/2021   CL 104 08/16/2021   CO2 29 08/16/2021   Lab Results  Component Value Date   ALT 6 08/16/2021   AST 13 (L) 08/16/2021   ALKPHOS 84 08/16/2021  BILITOT 0.6 08/16/2021   Lab Results  Component Value Date   HGBA1C 5.5 05/06/2021   HGBA1C 5.5 10/06/2020   Lab Results  Component Value Date   INSULIN 13.5 05/06/2021   INSULIN 22.0 10/06/2020   Lab Results  Component Value Date   TSH 1.490 10/06/2020   Lab Results  Component Value Date   CHOL 184 05/06/2021   HDL 49 05/06/2021   LDLCALC 117 (H) 05/06/2021   TRIG 98 05/06/2021   CHOLHDL 3.8  05/06/2021   Lab Results  Component Value Date   VD25OH 29.3 (L) 05/06/2021   VD25OH 16.0 (L) 10/06/2020   Lab Results  Component Value Date   WBC 10.2 08/16/2021   HGB 10.7 (L) 08/16/2021   HCT 34.2 (L) 08/16/2021   MCV 70.2 (L) 08/16/2021   PLT 358 08/16/2021   Lab Results  Component Value Date   IRON 24 (L) 08/16/2021   TIBC 454 (H) 08/16/2021   FERRITIN 5 (L) 08/16/2021   Attestation Statements:   Reviewed by clinician on day of visit: allergies, medications, problem list, medical history, surgical history, family history, social history, and previous encounter notes.  I, Elnora Morrison, RMA am acting as transcriptionist for Coralie Common, MD.  I have reviewed the above documentation for accuracy and completeness, and I agree with the above. - Coralie Common, MD

## 2021-08-24 ENCOUNTER — Telehealth: Payer: Self-pay | Admitting: Hematology

## 2021-08-24 NOTE — Telephone Encounter (Signed)
Scheduled follow-up appointment per 5/22 los. Patient is aware.

## 2021-08-25 DIAGNOSIS — Z0000 Encounter for general adult medical examination without abnormal findings: Secondary | ICD-10-CM | POA: Diagnosis not present

## 2021-08-25 DIAGNOSIS — I10 Essential (primary) hypertension: Secondary | ICD-10-CM | POA: Diagnosis not present

## 2021-08-26 LAB — ALPHA-THALASSEMIA GENOTYPR

## 2021-08-30 ENCOUNTER — Encounter (INDEPENDENT_AMBULATORY_CARE_PROVIDER_SITE_OTHER): Payer: Self-pay | Admitting: Family Medicine

## 2021-08-30 ENCOUNTER — Inpatient Hospital Stay: Payer: BC Managed Care – PPO | Attending: Hematology | Admitting: Hematology

## 2021-08-30 ENCOUNTER — Ambulatory Visit (INDEPENDENT_AMBULATORY_CARE_PROVIDER_SITE_OTHER): Payer: BC Managed Care – PPO | Admitting: Family Medicine

## 2021-08-30 VITALS — BP 125/70 | HR 82 | Temp 97.5°F | Ht 66.0 in | Wt 257.0 lb

## 2021-08-30 DIAGNOSIS — E559 Vitamin D deficiency, unspecified: Secondary | ICD-10-CM

## 2021-08-30 DIAGNOSIS — D509 Iron deficiency anemia, unspecified: Secondary | ICD-10-CM | POA: Diagnosis not present

## 2021-08-30 DIAGNOSIS — D508 Other iron deficiency anemias: Secondary | ICD-10-CM | POA: Diagnosis not present

## 2021-08-30 DIAGNOSIS — N920 Excessive and frequent menstruation with regular cycle: Secondary | ICD-10-CM | POA: Insufficient documentation

## 2021-08-30 DIAGNOSIS — D563 Thalassemia minor: Secondary | ICD-10-CM | POA: Diagnosis not present

## 2021-08-30 DIAGNOSIS — Z6841 Body Mass Index (BMI) 40.0 and over, adult: Secondary | ICD-10-CM | POA: Diagnosis not present

## 2021-08-30 DIAGNOSIS — Z79899 Other long term (current) drug therapy: Secondary | ICD-10-CM | POA: Diagnosis not present

## 2021-08-30 DIAGNOSIS — E669 Obesity, unspecified: Secondary | ICD-10-CM

## 2021-08-30 MED ORDER — VITAMIN D (ERGOCALCIFEROL) 1.25 MG (50000 UNIT) PO CAPS: 50000.0000 [IU] | ORAL_CAPSULE | ORAL | 0 refills | Status: DC

## 2021-08-30 NOTE — Progress Notes (Signed)
HEMATOLOGY/ONCOLOGY PHONE VISIT NOTE  Date of Service: 08/30/2021  Patient Care Team: Pcp, No as PCP - General  CHIEF COMPLAINTS/PURPOSE OF CONSULTATION:  Evaluation and consultation for iron deficiency anemia.  HISTORY OF PRESENTING ILLNESS: Jacqueline Griffin is a wonderful 46 y.o. female who has been referred to [place] by her Dr. Jearld Shines, Earvin Hansen, MD for evaluation and consultation for iron deficiency anemia. She reports She is doing well.  She notes heavy periods. She notes that in February of 2023 she was menstruating for almost a month. She reports excessive clotting during menstruation. She notes some fibroids.  We discussed that iron deficiency might be related to heavier menstruation.  She notes that she recently started taking the iron polysaccharide 150 mg 1x p.o daily. She says she does not like them as they change her bowel habits and give her some abdominal discomfort. We discussed trying the liquid form of iron polysaccharide and reducing frequency of intake to help with abdominal pain.   She reports some fatigue. She says that she is not able to move as quickly as she generally would.  She reports symptoms of PICA saying that she has been eating ice for the last 6-7 months.   We discussed getting IV iron such as Venofer of Injectafer based on labs during next visit.  We further discussed potential for alpha thalassemia trait.  No smoking or alcohol.  No fever, chills, or night sweats. No other new changes in bowel habits. No other new or acute focal symptoms.  Labs done 05/06/2021 were reviewed in detail. CBC stable.  CMP unremarkable. Iron sat ratio is 6%. Ferritin is 7.  INTERVAL HISTORY:   I connected with Jacqueline Griffin on 08/30/2021 at 3:30 PM by telephone visit and verified that I am speaking with the correct person using two identifiers.   I discussed the limitations, risks, security and privacy concerns of performing an evaluation and  management service by telemedicine and the availability of in-person appointments. I also discussed with the patient that there may be a patient responsible charge related to this service. The patient expressed understanding and agreed to proceed.   Other persons participating in the visit and their role in the encounter: None  Patient's location: Home Provider's location: [place] is a wonderful 46 y.o. female who was contacted via phone for evaluation and consultation for iron deficiency anemia. She reports She is doing well.  We discussed starting Venofer IV iron which she was agreeable to.  No other new or acute focal symptoms.  Labs done 08/16/2021 were reviewed in detail. CBC stable. WBC count is 10.2k, Hgb is 10.7, and 358k. Persistent microcytic anemia. MCV is 70.2. CMP unremarkable. Iron sat ratio is 5%. Ferritin is 5. 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 Revealed "Normal hemoglobin present; no hemoglobin variant or beta thalassemia identified."  MEDICAL HISTORY:  Past Medical History:  Diagnosis Date   Anemia    Anxiety    Chronic GERD    Depression    Fatty liver    Food allergy    Hypertension 09/18/2017   Iron deficiency    Joint pain    Morbid obesity ([place]) 04/01/2012   NASH (nonalcoholic steatohepatitis) 11/28/2012   OSA (obstructive sleep apnea)    OSA on CPAP 04/29/2016  Palpitations    Paroxysmal supraventricular tachycardia ([place]) 04/01/2009   Overview:    Supraventricular tachycardia ([place]) 1997   Symptomatic PVCs    Thyroid nodule 11/07/2014   Uterine fibroid     SURGICAL HISTORY: Past Surgical History:  Procedure Laterality Date   CESAREAN SECTION     HIP SURGERY     MANDIBLE RECONSTRUCTION  45 yo    SOCIAL HISTORY: Social History    Socioeconomic History   Marital status: Single    Spouse name: Not on file   Number of children: Not on file   Years of education: Not on file   Highest education level: Not on file  Occupational History   Occupation: Sales executive Document Specialist - [employer]  Tobacco Use   Smoking status: Never   Smokeless tobacco: Never  Vaping Use   Vaping Use: Never used  Substance and Sexual Activity   Alcohol use: No   Drug use: No   Sexual activity: Yes    Birth control/protection: Condom  Other Topics Concern   Not on file  Social History Narrative   Not on file   Social Determinants of Health   Financial Resource Strain: Not on file  Food Insecurity: Not on file  Transportation Needs: Not on file  Physical Activity: Not on file  Stress: Not on file  Social Connections: Not on file  Intimate Partner Violence: Not on file    FAMILY HISTORY: Family History  Problem Relation Age of Onset   Leukemia Mother    Hypertension Father    Diabetes Father    Heart disease Father    Kidney disease Father    Sleep apnea Father    Drug abuse Father    Obesity Father     ALLERGIES:  is allergic to shrimp [shellfish allergy], amoxicillin, cephalosporins, ciprofloxacin, clindamycin/lincomycin, doxycycline, and shrimp flavor.  MEDICATIONS:  Current Outpatient Medications  Medication Sig Dispense Refill   iron polysaccharides (NU-IRON) 150 MG capsule Take 1 capsule (150 mg total) by mouth daily. 30 capsule 0   metoprolol succinate (TOPROL-XL) 100 MG 24 hr tablet Take 100 mg by mouth daily. Take with or immediately following a meal.     sertraline (ZOLOFT) 50 MG tablet Take 1 tablet (50 mg total) by mouth daily. (Patient not taking: Reported on 08/30/2021) 30 tablet 0   Vitamin D, Ergocalciferol, (DRISDOL) 1.25 MG (50000 UNIT) CAPS capsule Take 1 capsule (50,000 Units total) by mouth every 7 (seven) days. 4 capsule 0   No current facility-administered medications for this visit.     REVIEW OF SYSTEMS:   10 Point review of Systems was done is negative except as noted above.  PHYSICAL EXAMINATION: Telemedicine appointment  LABORATORY DATA:  I have reviewed the data as listed  .[name]    Latest Ref Rng & Units 08/16/2021   12:11 PM 05/06/2021    9:31 AM 10/06/2020    8:31 AM  CBC  WBC 4.0 - 10.5 K/uL 10.2   8.6   11.3    Hemoglobin 12.0 - 15.0 g/dL 10.7   10.7   10.3    Hematocrit 36.0 - 46.0 % 34.2   35.9   37.0    Platelets 150 - 400 K/uL 358   375   379     .    Latest Ref Rng & Units 08/16/2021   12:11 PM 05/06/2021    9:31 AM 10/06/2020    8:31 AM  CMP  Glucose 70 - 99 mg/dL 87  74   83    BUN 6 - 20 mg/dL 8   8   10     Creatinine 0.44 - 1.00 mg/dL 0.55   0.57   0.50    Sodium 135 - 145 mmol/L 138   140   136    Potassium 3.5 - 5.1 mmol/L 3.8   4.1   4.2    Chloride 98 - 111 mmol/L 104   102   101    CO2 22 - 32 mmol/L 29   21   21     Calcium 8.9 - 10.3 mg/dL 9.0   9.0   8.9    Total Protein 6.5 - 8.1 g/dL 8.4   7.6   7.7    Total Bilirubin 0.3 - 1.2 mg/dL 0.6   0.6   0.6    Alkaline Phos 38 - 126 U/L 84   94   91    AST 15 - 41 U/L 13   17   13     ALT 0 - 44 U/L 6   6   8      . Lab Results  Component Value Date   IRON 24 (L) 08/16/2021   TIBC 454 (H) 08/16/2021   IRONPCTSAT 5 (L) 08/16/2021   (Iron and TIBC)  Lab Results  Component Value Date   FERRITIN 5 (L) 08/16/2021       RADIOGRAPHIC STUDIES: I have personally reviewed the radiological images as listed and agreed with the findings in the report. No results found.  ASSESSMENT & PLAN:   46 y.o. very pleasant female with  1.Microcytic Anemia likely due to Iron deficiency anemia -Labs done 05/06/2021 show iron sat of 6 and ferritin of 7. -Labs done 08/16/2021 show  Iron sat ratio is 5%. Ferritin is 5.  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 CBC stable. WBC count is 10.2k, Hgb is 10.7, and 358k. Persistent microcytic anemia. MCV is 70.2. CMP unremarkable. Iron sat ratio is 5%. Ferritin is 5. 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 Revealed "Normal hemoglobin present; no hemoglobin variant or beta thalassemia identified." -patient with significant iron deficiency and discussed and patient is agreeable with proceeding with IV Iron -Schedule IV Venofer 300 mg weekly x 3 doses ASAP -RTC in 10 weeks with labs  Follow up: IV Venofer 35m weekly x 3 doses as soon as possible RTC with Dr KIrene Limbowith labs in 10 weeks  No orders of the defined types were placed in this encounter.   All of the patients questions were answered with apparent satisfaction. The patient knows to call the clinic with any problems, questions or concerns.  .The total time spent in the appointment was 15 minutes* .  All of the patient's questions were answered with apparent satisfaction. The patient knows to call the clinic with any problems, questions or concerns.   GSullivan LoneMD MS AAHIVMS SSalt Lake Behavioral HealthCCentral Ohio Surgical InstituteHematology/Oncology Physician CNortheast [facility] .*Total Encounter Time as defined by the Centers for Medicare and Medicaid Services includes, in addition to the face-to-face time of a patient visit (documented in the note above) non-face-to-face time: obtaining and reviewing outside history, ordering and reviewing medications, tests or  procedures, care coordination (communications with other health care professionals or caregivers) and documentation in the medical record.  I, Melene Muller, am acting as scribe for Dr. Sullivan Lone, MD. .I have reviewed the above documentation for accuracy and completeness, and I agree with the above. Brunetta Genera MD

## 2021-08-31 ENCOUNTER — Telehealth: Payer: Self-pay | Admitting: Hematology

## 2021-08-31 NOTE — Telephone Encounter (Signed)
Scheduled follow-up appointments per 6/5 los. Patient is aware.

## 2021-09-02 NOTE — Progress Notes (Unsigned)
Chief Complaint:   OBESITY Jacqueline Griffin is here to discuss her progress with her obesity treatment plan along with follow-up of her obesity related diagnoses. Jacqueline Griffin is on keeping a food journal and adhering to recommended goals of 1600-1700 calories and 95 grams of protein and states she is following her eating plan approximately 10% of the time. Jacqueline Griffin states she is exercising 0 minutes 0 times per week.  Today's visit was #: 14 Starting weight: 263 lbs Starting date: 10/06/2021 Today's weight: 257 lbs Today's date: 08/30/2021 Total lbs lost to date: 6 lbs Total lbs lost since last in-office visit: 0  Interim History: Jacqueline Griffin has realized that she was not following plan as strictly as she would have liked. She has noted some rumination over last few weeks. She already cooks breakfast and supper. With school ending this week, she wants to recommit to journaling.  Subjective:   1. Vitamin D deficiency Jacqueline Griffin is currently taking prescription Vit D 50,000 IU once a week. Denies any nausea, vomiting or muscle weakness. She notes fatigue. Her last Vit D level of 29.3.  2. Microcytic anemia Jacqueline Griffin has iron deficiency and alpha thalassemia (+) trait. She does not tolerate iron by mouth well.  Assessment/Plan:   1. Vitamin D deficiency We will refill Vit D 50,000 IU once a week for 1 month with 0 refills.  -REFILL Vitamin D, Ergocalciferol, (DRISDOL) 1.25 MG (50000 UNIT) CAPS capsule; Take 1 capsule (50,000 Units total) by mouth every 7 (seven) days.  Dispense: 4 capsule; Refill: 0  2. Microcytic anemia Jacqueline Griffin will follow up with Oncology/hematology this afternoon.  3. Obesity with current BMI of 41.5 Jacqueline Griffin is currently in the action stage of change. As such, her goal is to continue with weight loss efforts. She has agreed to keeping a food journal and adhering to recommended goals of 1600-1700 calories and 95+ grams of protein daily.  Exercise goals: All adults should avoid  inactivity. Some physical activity is better than none, and adults who participate in any amount of physical activity gain some health benefits.  Behavioral modification strategies: increasing lean protein intake, meal planning and cooking strategies, keeping healthy foods in the home, and planning for success.  Jacqueline Griffin has agreed to follow-up with our clinic in 3 weeks. She was informed of the importance of frequent follow-up visits to maximize her success with intensive lifestyle modifications for her multiple health conditions.   Objective:   Blood pressure 125/70, pulse 82, temperature (!) 97.5 F (36.4 C), height 5' 6"  (1.676 m), weight 257 lb (116.6 kg), last menstrual period 08/03/2021, SpO2 98 %. Body mass index is 41.48 kg/m.  General: Cooperative, alert, well developed, in no acute distress. HEENT: Conjunctivae and lids unremarkable. Cardiovascular: Regular rhythm.  Lungs: Normal work of breathing. Neurologic: No focal deficits.   Lab Results  Component Value Date   CREATININE 0.55 08/16/2021   BUN 8 08/16/2021   NA 138 08/16/2021   K 3.8 08/16/2021   CL 104 08/16/2021   CO2 29 08/16/2021   Lab Results  Component Value Date   ALT 6 08/16/2021   AST 13 (L) 08/16/2021   ALKPHOS 84 08/16/2021   BILITOT 0.6 08/16/2021   Lab Results  Component Value Date   HGBA1C 5.5 05/06/2021   HGBA1C 5.5 10/06/2020   Lab Results  Component Value Date   INSULIN 13.5 05/06/2021   INSULIN 22.0 10/06/2020   Lab Results  Component Value Date   TSH 1.490 10/06/2020   Lab  Results  Component Value Date   CHOL 184 05/06/2021   HDL 49 05/06/2021   LDLCALC 117 (H) 05/06/2021   TRIG 98 05/06/2021   CHOLHDL 3.8 05/06/2021   Lab Results  Component Value Date   VD25OH 29.3 (L) 05/06/2021   VD25OH 16.0 (L) 10/06/2020   Lab Results  Component Value Date   WBC 10.2 08/16/2021   HGB 10.7 (L) 08/16/2021   HCT 34.2 (L) 08/16/2021   MCV 70.2 (L) 08/16/2021   PLT 358 08/16/2021    Lab Results  Component Value Date   IRON 24 (L) 08/16/2021   TIBC 454 (H) 08/16/2021   FERRITIN 5 (L) 08/16/2021   Attestation Statements:   Reviewed by clinician on day of visit: allergies, medications, problem list, medical history, surgical history, family history, social history, and previous encounter notes.  I, Elnora Morrison, RMA am acting as transcriptionist for Coralie Common, MD.  I have reviewed the above documentation for accuracy and completeness, and I agree with the above. -  ***

## 2021-09-03 ENCOUNTER — Encounter: Payer: Self-pay | Admitting: Hematology

## 2021-09-03 DIAGNOSIS — G4733 Obstructive sleep apnea (adult) (pediatric): Secondary | ICD-10-CM | POA: Diagnosis not present

## 2021-09-04 ENCOUNTER — Encounter: Payer: Self-pay | Admitting: Hematology

## 2021-09-06 ENCOUNTER — Inpatient Hospital Stay: Payer: BC Managed Care – PPO

## 2021-09-06 ENCOUNTER — Other Ambulatory Visit: Payer: Self-pay

## 2021-09-06 VITALS — BP 139/89 | HR 81 | Temp 98.8°F | Resp 17 | Wt 261.2 lb

## 2021-09-06 DIAGNOSIS — N920 Excessive and frequent menstruation with regular cycle: Secondary | ICD-10-CM | POA: Diagnosis not present

## 2021-09-06 DIAGNOSIS — D509 Iron deficiency anemia, unspecified: Secondary | ICD-10-CM | POA: Diagnosis not present

## 2021-09-06 DIAGNOSIS — Z79899 Other long term (current) drug therapy: Secondary | ICD-10-CM | POA: Diagnosis not present

## 2021-09-06 DIAGNOSIS — D563 Thalassemia minor: Secondary | ICD-10-CM | POA: Diagnosis not present

## 2021-09-06 DIAGNOSIS — D508 Other iron deficiency anemias: Secondary | ICD-10-CM

## 2021-09-06 MED ORDER — LORATADINE 10 MG PO TABS
10.0000 mg | ORAL_TABLET | Freq: Once | ORAL | Status: AC
  Administered 2021-09-06: 10 mg via ORAL
  Filled 2021-09-06: qty 1

## 2021-09-06 MED ORDER — SODIUM CHLORIDE 0.9 % IV SOLN: Freq: Once | INTRAVENOUS | Status: AC

## 2021-09-06 MED ORDER — ACETAMINOPHEN 325 MG PO TABS: 650.0000 mg | ORAL_TABLET | Freq: Once | ORAL | Status: DC

## 2021-09-06 MED ORDER — SODIUM CHLORIDE 0.9 % IV SOLN
300.0000 mg | Freq: Once | INTRAVENOUS | Status: AC
  Administered 2021-09-06: 300 mg via INTRAVENOUS
  Filled 2021-09-06: qty 300

## 2021-09-06 NOTE — Patient Instructions (Signed)

## 2021-09-08 ENCOUNTER — Other Ambulatory Visit (INDEPENDENT_AMBULATORY_CARE_PROVIDER_SITE_OTHER): Payer: Self-pay | Admitting: Family Medicine

## 2021-09-08 DIAGNOSIS — F419 Anxiety disorder, unspecified: Secondary | ICD-10-CM

## 2021-09-08 DIAGNOSIS — D508 Other iron deficiency anemias: Secondary | ICD-10-CM

## 2021-09-13 ENCOUNTER — Other Ambulatory Visit: Payer: Self-pay

## 2021-09-13 ENCOUNTER — Inpatient Hospital Stay: Payer: BC Managed Care – PPO

## 2021-09-13 VITALS — BP 148/73 | HR 84 | Temp 98.0°F | Resp 17 | Wt 263.2 lb

## 2021-09-13 DIAGNOSIS — D563 Thalassemia minor: Secondary | ICD-10-CM | POA: Diagnosis not present

## 2021-09-13 DIAGNOSIS — N920 Excessive and frequent menstruation with regular cycle: Secondary | ICD-10-CM | POA: Diagnosis not present

## 2021-09-13 DIAGNOSIS — D508 Other iron deficiency anemias: Secondary | ICD-10-CM

## 2021-09-13 DIAGNOSIS — Z79899 Other long term (current) drug therapy: Secondary | ICD-10-CM | POA: Diagnosis not present

## 2021-09-13 DIAGNOSIS — D509 Iron deficiency anemia, unspecified: Secondary | ICD-10-CM | POA: Diagnosis not present

## 2021-09-13 MED ORDER — LORATADINE 10 MG PO TABS
10.0000 mg | ORAL_TABLET | Freq: Once | ORAL | Status: AC
  Administered 2021-09-13: 10 mg via ORAL
  Filled 2021-09-13: qty 1

## 2021-09-13 MED ORDER — SODIUM CHLORIDE 0.9 % IV SOLN
300.0000 mg | Freq: Once | INTRAVENOUS | Status: AC
  Administered 2021-09-13: 300 mg via INTRAVENOUS
  Filled 2021-09-13: qty 300

## 2021-09-13 MED ORDER — SODIUM CHLORIDE 0.9 % IV SOLN: Freq: Once | INTRAVENOUS | Status: AC

## 2021-09-13 MED ORDER — ACETAMINOPHEN 325 MG PO TABS: 650.0000 mg | ORAL_TABLET | Freq: Once | ORAL | Status: DC

## 2021-09-13 NOTE — Patient Instructions (Signed)

## 2021-09-19 DIAGNOSIS — R002 Palpitations: Secondary | ICD-10-CM | POA: Diagnosis not present

## 2021-09-19 DIAGNOSIS — R55 Syncope and collapse: Secondary | ICD-10-CM | POA: Diagnosis not present

## 2021-09-20 ENCOUNTER — Inpatient Hospital Stay: Payer: BC Managed Care – PPO

## 2021-09-20 ENCOUNTER — Other Ambulatory Visit: Payer: Self-pay

## 2021-09-20 VITALS — BP 147/72 | HR 86 | Temp 98.3°F | Resp 16

## 2021-09-20 DIAGNOSIS — Z4509 Encounter for adjustment and management of other cardiac device: Secondary | ICD-10-CM | POA: Diagnosis not present

## 2021-09-20 DIAGNOSIS — Z79899 Other long term (current) drug therapy: Secondary | ICD-10-CM | POA: Diagnosis not present

## 2021-09-20 DIAGNOSIS — I493 Ventricular premature depolarization: Secondary | ICD-10-CM | POA: Diagnosis not present

## 2021-09-20 DIAGNOSIS — N920 Excessive and frequent menstruation with regular cycle: Secondary | ICD-10-CM | POA: Diagnosis not present

## 2021-09-20 DIAGNOSIS — D508 Other iron deficiency anemias: Secondary | ICD-10-CM

## 2021-09-20 DIAGNOSIS — D563 Thalassemia minor: Secondary | ICD-10-CM | POA: Diagnosis not present

## 2021-09-20 DIAGNOSIS — D509 Iron deficiency anemia, unspecified: Secondary | ICD-10-CM | POA: Diagnosis not present

## 2021-09-20 MED ORDER — SODIUM CHLORIDE 0.9 % IV SOLN
300.0000 mg | Freq: Once | INTRAVENOUS | Status: AC
  Administered 2021-09-20: 300 mg via INTRAVENOUS
  Filled 2021-09-20: qty 300

## 2021-09-20 MED ORDER — ACETAMINOPHEN 325 MG PO TABS: 650.0000 mg | ORAL_TABLET | Freq: Once | ORAL | Status: DC

## 2021-09-20 MED ORDER — LORATADINE 10 MG PO TABS
10.0000 mg | ORAL_TABLET | Freq: Once | ORAL | Status: AC
  Administered 2021-09-20: 10 mg via ORAL
  Filled 2021-09-20: qty 1

## 2021-09-20 MED ORDER — SODIUM CHLORIDE 0.9 % IV SOLN: Freq: Once | INTRAVENOUS | Status: AC

## 2021-09-27 ENCOUNTER — Encounter (INDEPENDENT_AMBULATORY_CARE_PROVIDER_SITE_OTHER): Payer: Self-pay | Admitting: Family Medicine

## 2021-09-27 ENCOUNTER — Ambulatory Visit (INDEPENDENT_AMBULATORY_CARE_PROVIDER_SITE_OTHER): Payer: BC Managed Care – PPO | Admitting: Family Medicine

## 2021-09-27 VITALS — BP 132/62 | HR 76 | Temp 98.1°F | Ht 66.0 in | Wt 255.0 lb

## 2021-09-27 DIAGNOSIS — E559 Vitamin D deficiency, unspecified: Secondary | ICD-10-CM | POA: Diagnosis not present

## 2021-09-27 DIAGNOSIS — Z6841 Body Mass Index (BMI) 40.0 and over, adult: Secondary | ICD-10-CM | POA: Diagnosis not present

## 2021-09-27 DIAGNOSIS — E669 Obesity, unspecified: Secondary | ICD-10-CM | POA: Diagnosis not present

## 2021-09-27 DIAGNOSIS — D508 Other iron deficiency anemias: Secondary | ICD-10-CM

## 2021-09-27 MED ORDER — VITAMIN D (ERGOCALCIFEROL) 1.25 MG (50000 UNIT) PO CAPS: 50000.0000 [IU] | ORAL_CAPSULE | ORAL | 0 refills | Status: DC

## 2021-09-29 NOTE — Progress Notes (Signed)
Chief Complaint:   OBESITY Jacqueline Griffin is here to discuss her progress with her obesity treatment plan along with follow-up of her obesity related diagnoses. Jacqueline Griffin is on keeping a food journal and adhering to recommended goals of 1600-1700 calories and 95+ grams of protein and states she is following her eating plan approximately 80% of the time. Jacqueline Griffin states she is exercising/walking 8,000 steps 7 times per week.  Today's visit was #: 15 Starting weight: 263 lbs Starting date: 10/06/2020 Today's weight: 255 lbs Today's date: 09/27/2021 Total lbs lost to date: 8 lbs Total lbs lost since last in-office visit: 2  Interim History: Jacqueline Griffin has gotten a whole week of journaling in over last few weeks. She has been logging food before she eats so she knows calories and nutrition in before she eats. She has had 2 days of going over calories. She got an Apple watch and has been moving around more. She realizes she was probably getting 3000-4000 cal/day in previous. She realizes planning and accessibility of food to be the biggest obstacle.   Subjective:   1. Vitamin D deficiency Jacqueline Griffin is currently taking prescription Vit D 50,000 IU once a week. Her last Vit D level of 29.3.  2. Other iron deficiency anemia Jacqueline Griffin has received 3 infusions of iron so far. Feels markedly better than 1 month ago.  Assessment/Plan:   1. Vitamin D deficiency We will refill Vit D 50,000 IU once a week for 1 month with 0 refills.  -Refill Vitamin D, Ergocalciferol, (DRISDOL) 1.25 MG (50000 UNIT) CAPS capsule; Take 1 capsule (50,000 Units total) by mouth every 7 (seven) days.  Dispense: 4 capsule; Refill: 0  2. Other iron deficiency anemia Jacqueline Griffin will follow up on labs in Aug per hematology/oncology.  3. Obesity with current BMI of 41.3 Jacqueline Griffin is currently in the action stage of change. As such, her goal is to continue with weight loss efforts. She has agreed to keeping a food journal and adhering to  recommended goals of 1600-1700 calories and 95+ grams of protein.   Exercise goals: All adults should avoid inactivity. Some physical activity is better than none, and adults who participate in any amount of physical activity gain some health benefits.  Behavioral modification strategies: increasing lean protein intake, meal planning and cooking strategies, keeping healthy foods in the home, and planning for success.  Jacqueline Griffin has agreed to follow-up with our clinic in 3 weeks. She was informed of the importance of frequent follow-up visits to maximize her success with intensive lifestyle modifications for her multiple health conditions.   Objective:   Blood pressure 132/62, pulse 76, temperature 98.1 F (36.7 C), height 5' 6"  (1.676 m), weight 255 lb (115.7 kg), SpO2 98 %. Body mass index is 41.16 kg/m.  General: Cooperative, alert, well developed, in no acute distress. HEENT: Conjunctivae and lids unremarkable. Cardiovascular: Regular rhythm.  Lungs: Normal work of breathing. Neurologic: No focal deficits.   Lab Results  Component Value Date   CREATININE 0.55 08/16/2021   BUN 8 08/16/2021   NA 138 08/16/2021   K 3.8 08/16/2021   CL 104 08/16/2021   CO2 29 08/16/2021   Lab Results  Component Value Date   ALT 6 08/16/2021   AST 13 (L) 08/16/2021   ALKPHOS 84 08/16/2021   BILITOT 0.6 08/16/2021   Lab Results  Component Value Date   HGBA1C 5.5 05/06/2021   HGBA1C 5.5 10/06/2020   Lab Results  Component Value Date   INSULIN 13.5  05/06/2021   INSULIN 22.0 10/06/2020   Lab Results  Component Value Date   TSH 1.490 10/06/2020   Lab Results  Component Value Date   CHOL 184 05/06/2021   HDL 49 05/06/2021   LDLCALC 117 (H) 05/06/2021   TRIG 98 05/06/2021   CHOLHDL 3.8 05/06/2021   Lab Results  Component Value Date   VD25OH 29.3 (L) 05/06/2021   VD25OH 16.0 (L) 10/06/2020   Lab Results  Component Value Date   WBC 10.2 08/16/2021   HGB 10.7 (L) 08/16/2021   HCT  34.2 (L) 08/16/2021   MCV 70.2 (L) 08/16/2021   PLT 358 08/16/2021   Lab Results  Component Value Date   IRON 24 (L) 08/16/2021   TIBC 454 (H) 08/16/2021   FERRITIN 5 (L) 08/16/2021   Attestation Statements:   Reviewed by clinician on day of visit: allergies, medications, problem list, medical history, surgical history, family history, social history, and previous encounter notes.  I, Elnora Morrison, RMA am acting as transcriptionist for Coralie Common, MD.  I have reviewed the above documentation for accuracy and completeness, and I agree with the above. - Coralie Common, MD

## 2021-10-03 DIAGNOSIS — G4733 Obstructive sleep apnea (adult) (pediatric): Secondary | ICD-10-CM | POA: Diagnosis not present

## 2021-10-12 DIAGNOSIS — Z4509 Encounter for adjustment and management of other cardiac device: Secondary | ICD-10-CM | POA: Diagnosis not present

## 2021-10-19 ENCOUNTER — Ambulatory Visit (INDEPENDENT_AMBULATORY_CARE_PROVIDER_SITE_OTHER): Payer: BC Managed Care – PPO | Admitting: Family Medicine

## 2021-10-19 ENCOUNTER — Encounter (INDEPENDENT_AMBULATORY_CARE_PROVIDER_SITE_OTHER): Payer: Self-pay | Admitting: Family Medicine

## 2021-10-19 VITALS — BP 130/78 | HR 76 | Temp 97.8°F | Ht 66.0 in | Wt 256.0 lb

## 2021-10-19 DIAGNOSIS — E559 Vitamin D deficiency, unspecified: Secondary | ICD-10-CM

## 2021-10-19 DIAGNOSIS — E8881 Metabolic syndrome: Secondary | ICD-10-CM

## 2021-10-19 DIAGNOSIS — Z6841 Body Mass Index (BMI) 40.0 and over, adult: Secondary | ICD-10-CM

## 2021-10-19 DIAGNOSIS — E669 Obesity, unspecified: Secondary | ICD-10-CM

## 2021-10-19 MED ORDER — VITAMIN D (ERGOCALCIFEROL) 1.25 MG (50000 UNIT) PO CAPS: 50000.0000 [IU] | ORAL_CAPSULE | ORAL | 0 refills | Status: DC

## 2021-10-21 DIAGNOSIS — Z4509 Encounter for adjustment and management of other cardiac device: Secondary | ICD-10-CM | POA: Diagnosis not present

## 2021-10-21 DIAGNOSIS — R002 Palpitations: Secondary | ICD-10-CM | POA: Diagnosis not present

## 2021-10-21 DIAGNOSIS — R55 Syncope and collapse: Secondary | ICD-10-CM | POA: Diagnosis not present

## 2021-10-21 NOTE — Progress Notes (Signed)
Chief Complaint:   OBESITY Jacqueline Griffin is here to discuss her progress with her obesity treatment plan along with follow-up of her obesity related diagnoses. Chirstina is on keeping a food journal and adhering to recommended goals of 1600-1700 calories and 95+ grams of protein and states she is following her eating plan approximately 80% of the time. Jacqueline Griffin states she is walking [phone] steps 1-7 times per week.  Today's visit was #: 79 Starting weight: 263 lbs Starting date: 10/06/2020 Today's weight: 256 lbs Today's date: 10/19/2021 Total lbs lost to date: 7 lbs Total lbs lost since last in-office visit: 0  Interim History: Jacqueline Griffin has been focusing on trying to get protein content in daily. She has focused on being active and logging daily. Weekdays are easier than weekends. Alternating between breakfast options. Still being mindful of choices she is taking. Averages 57 grams of protein for the day. Realizing dinner tends to be difficult.  Subjective:   1. Vitamin D deficiency Jacqueline Griffin is currently taking prescription Vit D 50,000 IU once a week. Denies any nausea, vomiting or muscle weakness. She notes fatigue. Her last Vit D level of 29.3.  2. Insulin resistance Jacqueline Griffin's A1c 5.5, insulin 13.5. She is not on medications. She did ask about GLP-1.  Assessment/Plan:   1. Vitamin D deficiency We will refill Vit D 50,000 IU once a week for 1 month with 0 refills. Labs in the next 1-2 months.  -Refill Vitamin D, Ergocalciferol, (DRISDOL) 1.25 MG (50000 UNIT) CAPS capsule; Take 1 capsule (50,000 Units total) by mouth every 7 (seven) days.  Dispense: 4 capsule; Refill: 0  2. Insulin resistance Will follow up labs in 1-2 months. May want to discuss GLP-1 initiating pending results.   3. Obesity with current BMI of 41.4 Jacqueline Griffin is currently in the action stage of change. As such, her goal is to continue with weight loss efforts. She has agreed to keeping a food journal and adhering to  recommended goals of 1600-1700 calories and 95+ grams of protein daily.  Exercise goals: As is.  Behavioral modification strategies: increasing lean protein intake, meal planning and cooking strategies, keeping healthy foods in the home, and planning for success.  Jacqueline Griffin has agreed to follow-up with our clinic in 4 weeks. She was informed of the importance of frequent follow-up visits to maximize her success with intensive lifestyle modifications for her multiple health conditions.   Objective:   Blood pressure 130/78, pulse 76, temperature 97.8 F (36.6 C), height 5' 6"  (1.676 m), weight 256 lb (116.1 kg), last menstrual period 10/03/2021, SpO2 100 %. Body mass index is 41.32 kg/m.  General: Cooperative, alert, well developed, in no acute distress. HEENT: Conjunctivae and lids unremarkable. Cardiovascular: Regular rhythm.  Lungs: Normal work of breathing. Neurologic: No focal deficits.   Lab Results  Component Value Date   CREATININE 0.55 08/16/2021   BUN 8 08/16/2021   NA 138 08/16/2021   K 3.8 08/16/2021   CL 104 08/16/2021   CO2 29 08/16/2021   Lab Results  Component Value Date   ALT 6 08/16/2021   AST 13 (L) 08/16/2021   ALKPHOS 84 08/16/2021   BILITOT 0.6 08/16/2021   Lab Results  Component Value Date   HGBA1C 5.5 05/06/2021   HGBA1C 5.5 10/06/2020   Lab Results  Component Value Date   INSULIN 13.5 05/06/2021   INSULIN 22.0 10/06/2020   Lab Results  Component Value Date   TSH 1.490 10/06/2020   Lab Results  Component  Value Date   CHOL 184 05/06/2021   HDL 49 05/06/2021   LDLCALC 117 (H) 05/06/2021   TRIG 98 05/06/2021   CHOLHDL 3.8 05/06/2021   Lab Results  Component Value Date   VD25OH 29.3 (L) 05/06/2021   VD25OH 16.0 (L) 10/06/2020   Lab Results  Component Value Date   WBC 10.2 08/16/2021   HGB 10.7 (L) 08/16/2021   HCT 34.2 (L) 08/16/2021   MCV 70.2 (L) 08/16/2021   PLT 358 08/16/2021   Lab Results  Component Value Date   IRON 24  (L) 08/16/2021   TIBC 454 (H) 08/16/2021   FERRITIN 5 (L) 08/16/2021   Attestation Statements:   Reviewed by clinician on day of visit: allergies, medications, problem list, medical history, surgical history, family history, social history, and previous encounter notes.  I, Elnora Morrison, RMA am acting as transcriptionist for Coralie Common, MD.  I have reviewed the above documentation for accuracy and completeness, and I agree with the above. - Coralie Common, MD

## 2021-10-24 DIAGNOSIS — Z4509 Encounter for adjustment and management of other cardiac device: Secondary | ICD-10-CM | POA: Diagnosis not present

## 2021-10-24 DIAGNOSIS — R55 Syncope and collapse: Secondary | ICD-10-CM | POA: Diagnosis not present

## 2021-10-24 DIAGNOSIS — R002 Palpitations: Secondary | ICD-10-CM | POA: Diagnosis not present

## 2021-11-03 ENCOUNTER — Encounter (INDEPENDENT_AMBULATORY_CARE_PROVIDER_SITE_OTHER): Payer: Self-pay

## 2021-11-05 ENCOUNTER — Other Ambulatory Visit: Payer: Self-pay | Admitting: *Deleted

## 2021-11-05 DIAGNOSIS — D508 Other iron deficiency anemias: Secondary | ICD-10-CM

## 2021-11-08 ENCOUNTER — Inpatient Hospital Stay: Payer: BC Managed Care – PPO | Attending: Hematology | Admitting: Hematology

## 2021-11-08 ENCOUNTER — Other Ambulatory Visit: Payer: Self-pay

## 2021-11-08 ENCOUNTER — Inpatient Hospital Stay: Payer: BC Managed Care – PPO

## 2021-11-08 VITALS — BP 140/75 | HR 68 | Temp 97.7°F | Resp 16 | Wt 259.7 lb

## 2021-11-08 DIAGNOSIS — N920 Excessive and frequent menstruation with regular cycle: Secondary | ICD-10-CM | POA: Insufficient documentation

## 2021-11-08 DIAGNOSIS — D508 Other iron deficiency anemias: Secondary | ICD-10-CM

## 2021-11-08 DIAGNOSIS — D509 Iron deficiency anemia, unspecified: Secondary | ICD-10-CM | POA: Diagnosis not present

## 2021-11-08 LAB — CMP (CANCER CENTER ONLY)
ALT: 6 U/L (ref 0–44)
AST: 10 U/L — ABNORMAL LOW (ref 15–41)
Albumin: 3.9 g/dL (ref 3.5–5.0)
Alkaline Phosphatase: 76 U/L (ref 38–126)
Anion gap: 3 — ABNORMAL LOW (ref 5–15)
BUN: 10 mg/dL (ref 6–20)
CO2: 26 mmol/L (ref 22–32)
Calcium: 8.9 mg/dL (ref 8.9–10.3)
Chloride: 106 mmol/L (ref 98–111)
Creatinine: 0.53 mg/dL (ref 0.44–1.00)
GFR, Estimated: >60 mL/min (ref >60)
Glucose, Bld: 101 mg/dL — ABNORMAL HIGH (ref 70–99)
Potassium: 3.7 mmol/L (ref 3.5–5.1)
Sodium: 135 mmol/L (ref 135–145)
Total Bilirubin: 0.6 mg/dL (ref 0.3–1.2)
Total Protein: 7.7 g/dL (ref 6.5–8.1)

## 2021-11-08 LAB — CBC WITH DIFFERENTIAL (CANCER CENTER ONLY)
Abs Immature Granulocytes: 0.03 10*3/uL (ref 0.00–0.07)
Basophils Absolute: 0 10*3/uL (ref 0.0–0.1)
Basophils Relative: 0 %
Eosinophils Absolute: 0.2 10*3/uL (ref 0.0–0.5)
Eosinophils Relative: 2 %
HCT: 36.5 % (ref 36.0–46.0)
Hemoglobin: 12 g/dL (ref 12.0–15.0)
Immature Granulocytes: 0 %
Lymphocytes Relative: 24 %
Lymphs Abs: 2.5 10*3/uL (ref 0.7–4.0)
MCH: 24.7 pg — ABNORMAL LOW (ref 26.0–34.0)
MCHC: 32.9 g/dL (ref 30.0–36.0)
MCV: 75.1 fL — ABNORMAL LOW (ref 80.0–100.0)
Monocytes Absolute: 0.6 10*3/uL (ref 0.1–1.0)
Monocytes Relative: 6 %
Neutro Abs: 7.3 10*3/uL (ref 1.7–7.7)
Neutrophils Relative %: 68 %
Platelet Count: 309 10*3/uL (ref 150–400)
RBC: 4.86 MIL/uL (ref 3.87–5.11)
RDW: 19.3 % — ABNORMAL HIGH (ref 11.5–15.5)
WBC Count: 10.6 10*3/uL — ABNORMAL HIGH (ref 4.0–10.5)
nRBC: 0 % (ref 0.0–0.2)

## 2021-11-08 LAB — IRON AND IRON BINDING CAPACITY (CC-WL,HP ONLY)
Iron: 41 ug/dL (ref 28–170)
Saturation Ratios: 12 % (ref 10.4–31.8)
TIBC: 344 ug/dL (ref 250–450)
UIBC: 303 ug/dL (ref 148–442)

## 2021-11-08 NOTE — Progress Notes (Signed)
HEMATOLOGY/ONCOLOGY CLINIC NOTE  Date of Service: 11/08/2021  Patient Care Team: Pcp, No as PCP - General  CHIEF COMPLAINTS/PURPOSE OF CONSULTATION:  Evaluation and consultation for iron deficiency anemia.  HISTORY OF PRESENTING ILLNESS: Jacqueline Griffin is a wonderful 46 y.o. female who has been referred to [place] by her Dr. Jearld Shines, Earvin Hansen, MD for evaluation and consultation for iron deficiency anemia. She reports She is doing well.  She notes heavy periods. She notes that in February of 2023 she was menstruating for almost a month. She reports excessive clotting during menstruation. She notes some fibroids.  We discussed that iron deficiency might be related to heavier menstruation.  She notes that she recently started taking the iron polysaccharide 150 mg 1x p.o daily. She says she does not like them as they change her bowel habits and give her some abdominal discomfort. We discussed trying the liquid form of iron polysaccharide and reducing frequency of intake to help with abdominal pain.   She reports some fatigue. She says that she is not able to move as quickly as she generally would.  She reports symptoms of PICA saying that she has been eating ice for the last 6-7 months.   We discussed getting IV iron such as Venofer of Injectafer based on labs during next visit.  We further discussed potential for alpha thalassemia trait.  No smoking or alcohol.  No fever, chills, or night sweats. No other new changes in bowel habits. No other new or acute focal symptoms.  Labs done 05/06/2021 were reviewed in detail. CBC stable.  CMP unremarkable. Iron sat ratio is 6%. Ferritin is 7.  INTERVAL HISTORY:   Jacqueline Griffin is a wonderful 46 y.o. female who was contacted via phone for evaluation and consultation for iron deficiency anemia. She reports She is doing well.  She notes she has a lot of clots when menstruating. She has been told this is likely due to  fibroids.  We discussed scheduling IV iron and adjusting as needed based on pending iron labs.  We discussed starting an OTC Vitamin B complex.  She is going to follow up with Dr. Mallie Mussel OB/GY[facility] for continued care of heavy periods and menorrhagia.   No other new or acute focal symptoms.  Labs done today were reviewed in detail. We discussed US pelvis done 07/08/2021.  MEDICAL HISTORY:  Past Medical History:  Diagnosis Date   Anemia    Anxiety    Chronic GERD    Depression    Fatty liver    Food allergy    Hypertension 09/18/2017   Iron deficiency    Joint pain    Morbid obesity ([place]) 04/01/2012   NASH (nonalcoholic steatohepatitis) 11/28/2012   OSA (obstructive sleep apnea)    OSA on CPAP 04/29/2016   Palpitations    Paroxysmal supraventricular tachycardia ([place]) 04/01/2009   Overview:    Supraventricular tachycardia ([place]) 1997   Symptomatic PVCs    Thyroid nodule 11/07/2014   Uterine fibroid     SURGICAL HISTORY: Past Surgical History:  Procedure Laterality Date   CESAREAN SECTION     HIP SURGERY     MANDIBLE RECONSTRUCTION  46 yo    SOCIAL HISTORY: Social History   Socioeconomic History   Marital status: Single    Spouse name: Not on file   Number of children: Not on file   Years of education: Not on file   Highest education level: Not on file  Occupational History   Occupation:  Quality Control Document Specialist - [employer]  Tobacco Use   Smoking status: Never   Smokeless tobacco: Never  Vaping Use   Vaping Use: Never used  Substance and Sexual Activity   Alcohol use: No   Drug use: No   Sexual activity: Yes    Birth control/protection: Condom  Other Topics Concern   Not on file  Social History Narrative   Not on file   Social Determinants of Health   Financial Resource Strain: Not on file  Food Insecurity: Not on file  Transportation Needs: Not on file  Physical Activity: Not on file  Stress: Not on file  Social Connections: Not on file   Intimate Partner Violence: Not on file    FAMILY HISTORY: Family History  Problem Relation Age of Onset   Leukemia Mother    Hypertension Father    Diabetes Father    Heart disease Father    Kidney disease Father    Sleep apnea Father    Drug abuse Father    Obesity Father     ALLERGIES:  is allergic to shrimp [shellfish allergy], amoxicillin, cephalosporins, ciprofloxacin, clindamycin/lincomycin, doxycycline, and shrimp flavor.  MEDICATIONS:  Current Outpatient Medications  Medication Sig Dispense Refill   metoprolol succinate (TOPROL-XL) 100 MG 24 hr tablet Take 100 mg by mouth daily. Take with or immediately following a meal.     sertraline (ZOLOFT) 50 MG tablet Take 1 tablet (50 mg total) by mouth daily. 30 tablet 0   Vitamin D, Ergocalciferol, (DRISDOL) 1.25 MG (50000 UNIT) CAPS capsule Take 1 capsule (50,000 Units total) by mouth every 7 (seven) days. 4 capsule 0   No current facility-administered medications for this visit.    REVIEW OF SYSTEMS:   10 Point review of Systems was done is negative except as noted above.  PHYSICAL EXAMINATION: Vitals:   11/08/21 1246  BP: (!) 140/75  Pulse: 68  Resp: 16  Temp: 97.7 F (36.5 C)  SpO2: 99%   NAD GENERAL:alert, in no acute distress and comfortable SKIN: no acute rashes, no significant lesions EYES: conjunctiva are pink and non-injected, sclera anicteric NECK: supple, no JVD LYMPH:  no palpable lymphadenopathy in the cervical, axillary or inguinal regions LUNGS: clear to auscultation b/l with normal respiratory effort HEART: regular rate & rhythm ABDOMEN:  normoactive bowel sounds , non tender, not distended. Extremity: no pedal edema PSYCH: alert & oriented x 3 with fluent speech NEURO: no focal motor/sensory deficits  LABORATORY DATA:  I have reviewed the data as listed  .[name]    Latest Ref Rng & Units 11/08/2021   12:12 PM 08/16/2021   12:11 PM 05/06/2021    9:31 AM  CBC  WBC 4.0 - 10.5 K/uL 10.6  10.2   8.6   Hemoglobin 12.0 - 15.0 g/dL 12.0  10.7  10.7   Hematocrit 36.0 - 46.0 % 36.5  34.2  35.9   Platelets 150 - 400 K/uL 309  358  375    .    Latest Ref Rng & Units 11/08/2021   12:12 PM 08/16/2021   12:11 PM 05/06/2021    9:31 AM  CMP  Glucose 70 - 99 mg/dL 101  87  74   BUN 6 - 20 mg/dL 10  8  8    Creatinine 0.44 - 1.00 mg/dL 0.53  0.55  0.57   Sodium 135 - 145 mmol/L 135  138  140   Potassium 3.5 - 5.1 mmol/L 3.7  3.8  4.1  Chloride 98 - 111 mmol/L 106  104  102   CO2 22 - 32 mmol/L 26  29  21    Calcium 8.9 - 10.3 mg/dL 8.9  9.0  9.0   Total Protein 6.5 - 8.1 g/dL 7.7  8.4  7.6   Total Bilirubin 0.3 - 1.2 mg/dL 0.6  0.6  0.6   Alkaline Phos 38 - 126 U/L 76  84  94   AST 15 - 41 U/L 10  13  17    ALT 0 - 44 U/L 6  6  6     . Lab Results  Component Value Date   IRON 41 11/08/2021   TIBC 344 11/08/2021   IRONPCTSAT 12 11/08/2021   (Iron and TIBC)  Lab Results  Component Value Date   FERRITIN 33 11/08/2021       RADIOGRAPHIC STUDIES: I have personally reviewed the radiological images as listed and agreed with the findings in the report. No results found.  US pelvis done 07/08/2021 revealed "Uterus: 9.48 x 5.23 x 6.82 cm. anteverted  Endometrial stripe: 11.17 mm  Fibroids: yes: 1) 3.49 x 3.45 x 3.30 cm and 2) 0.80 x 0.80 x 0.95 cm  Endometrial mass: none seen  Cervix: normal  Right ovary: normal, no masses  Left ovary: normal, no masses  Other findings of note: none   Stable fibroid uterus. Discussed in depth at her visit "  ASSESSMENT & PLAN:   46 y.o. very pleasant female with  1.Microcytic Anemia likely due to Iron deficiency anemia -Labs done 05/06/2021 show iron sat of 6 and ferritin of 7. -Labs done 08/16/2021 show  Iron sat ratio is 5%. Ferritin is 5. -Labs done 11/08/2021 show iron sat of 12% and ferritin of 33.  2. Relative polycythemia --Noted to have Alpha thalassemia trait  Plan -I discussed available labs with the patient in details Labs  done 11/08/2021 were reviewed in detail. CBC stable. WBC count is 10.6k, Hgb is 12, and platelets 309k. Persistent microcytic anemia. MCV is 70.2. CMP unremarkable. Iron sat ratio is 12%. Ferritin is 33 -- goal 100. 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 Revealed "Normal hemoglobin present; no hemoglobin variant or beta thalassemia identified." -patient with significant iron deficiency and discussed and patient is agreeable with proceeding with IV Iron -Schedule IV Venofer 300 mg weekly x 3 doses. -We discussed US pelvis done 07/08/2021. -We discussed scheduling IV iron and adjusting as needed based on pending iron labs. -We discussed starting an OTC Vitamin B complex. -She is going to follow up with Dr. Mallie Mussel OB/GYN for continued care of heavy periods and menorrhagia.   Follow up: IV Venofer 311m weekly x 3 doses RTC with Dr KIrene Limbowith labs in 4 months  No orders of the defined types were placed in this encounter.   All of the patients questions were answered with apparent satisfaction. The patient knows to call the clinic with any problems, questions or concerns.  .The total time spent in the appointment was 20 minutes* .  All of the patient's questions were answered with apparent satisfaction. The patient knows to call the clinic with any problems, questions or concerns.   GSullivan LoneMD MS AAHIVMS SDoctors Surgery Center LLCCRegency Hospital [facility] LLCHematology/Oncology Physician [facility] .*Total Encounter Time as defined by the Centers for Medicare  and Medicaid Services includes, in addition to the face-to-face time of a patient visit (documented in the note above) non-face-to-face time: obtaining and reviewing outside history, ordering and reviewing medications, tests or procedures, care coordination  (communications with other health care professionals or caregivers) and documentation in the medical record.  I, Melene Muller, am acting as scribe for Dr. Sullivan Lone, MD.  .I have reviewed the above documentation for accuracy and completeness, and I agree with the above.   Brunetta Genera MD

## 2021-11-09 LAB — FERRITIN: Ferritin: 33 ng/mL (ref 11–307)

## 2021-11-13 DIAGNOSIS — L02821 Furuncle of head [any part, except face]: Secondary | ICD-10-CM | POA: Diagnosis not present

## 2021-11-13 DIAGNOSIS — Z6841 Body Mass Index (BMI) 40.0 and over, adult: Secondary | ICD-10-CM | POA: Diagnosis not present

## 2021-11-13 DIAGNOSIS — L2089 Other atopic dermatitis: Secondary | ICD-10-CM | POA: Diagnosis not present

## 2021-11-15 ENCOUNTER — Encounter: Payer: Self-pay | Admitting: Hematology

## 2021-11-15 ENCOUNTER — Inpatient Hospital Stay: Payer: BC Managed Care – PPO

## 2021-11-16 ENCOUNTER — Inpatient Hospital Stay: Payer: BC Managed Care – PPO

## 2021-11-16 ENCOUNTER — Other Ambulatory Visit: Payer: Self-pay

## 2021-11-16 ENCOUNTER — Ambulatory Visit (INDEPENDENT_AMBULATORY_CARE_PROVIDER_SITE_OTHER): Payer: BC Managed Care – PPO | Admitting: Family Medicine

## 2021-11-16 VITALS — BP 142/67 | HR 70 | Temp 98.8°F | Resp 16 | Wt 257.5 lb

## 2021-11-16 DIAGNOSIS — D509 Iron deficiency anemia, unspecified: Secondary | ICD-10-CM | POA: Diagnosis not present

## 2021-11-16 DIAGNOSIS — N920 Excessive and frequent menstruation with regular cycle: Secondary | ICD-10-CM | POA: Diagnosis not present

## 2021-11-16 DIAGNOSIS — D508 Other iron deficiency anemias: Secondary | ICD-10-CM

## 2021-11-16 MED ORDER — LORATADINE 10 MG PO TABS
10.0000 mg | ORAL_TABLET | Freq: Once | ORAL | Status: AC
  Administered 2021-11-16: 10 mg via ORAL
  Filled 2021-11-16: qty 1

## 2021-11-16 MED ORDER — ACETAMINOPHEN 325 MG PO TABS: 650.0000 mg | ORAL_TABLET | Freq: Once | ORAL | Status: DC

## 2021-11-16 MED ORDER — SODIUM CHLORIDE 0.9 % IV SOLN
300.0000 mg | Freq: Once | INTRAVENOUS | Status: AC
  Administered 2021-11-16: 300 mg via INTRAVENOUS
  Filled 2021-11-16: qty 300

## 2021-11-16 MED ORDER — SODIUM CHLORIDE 0.9 % IV SOLN: Freq: Once | INTRAVENOUS | Status: AC

## 2021-11-16 NOTE — Patient Instructions (Signed)

## 2021-11-16 NOTE — Progress Notes (Signed)
Patient observed for 30 minutes post venofer infusion. Patient tolerated tx without issues. Patient ambulatory upon discharge.

## 2021-11-17 ENCOUNTER — Ambulatory Visit: Payer: Self-pay

## 2021-11-17 ENCOUNTER — Ambulatory Visit (INDEPENDENT_AMBULATORY_CARE_PROVIDER_SITE_OTHER): Payer: BC Managed Care – PPO

## 2021-11-17 ENCOUNTER — Ambulatory Visit: Payer: BC Managed Care – PPO | Admitting: Family Medicine

## 2021-11-17 VITALS — BP 148/88 | HR 68 | Ht 66.0 in | Wt 256.0 lb

## 2021-11-17 DIAGNOSIS — M25561 Pain in right knee: Secondary | ICD-10-CM

## 2021-11-17 DIAGNOSIS — M1711 Unilateral primary osteoarthritis, right knee: Secondary | ICD-10-CM | POA: Diagnosis not present

## 2021-11-17 NOTE — Patient Instructions (Addendum)
Thank you for coming in today.   Please get an Xray today before you leave   Call or go to the ER if you develop a large red swollen joint with extreme pain or oozing puss.    Let me know how it goes.   Recheck in 4 weeks. Depending on workers comp.

## 2021-11-17 NOTE — Progress Notes (Signed)
I, Peterson Lombard, LAT, ATC acting as a scribe for Lynne Leader, MD.  Jacqueline Griffin is a 46 y.o. female who presents to [facility] today for R knee pain. Pt was last seen by Dr. Georgina Snell on 05/07/21 for L shoulder pain and for her R knee on 04/09/20 and was given a R knee steroid injection. Today, pt reports yesterday at work, someone put a [employer] out in front of her office door. When she came out of the office door, she tripped over her became entangled with the sign and her R knee suffered varus force on her R knee, while her body remained forward. Pt locates pain to the anterior aspect of the R knee.  Knee swelling: yes Mechanical symptoms: yes- catches Aggravates: knee flexion Treatments tried: pennsaid  Dx testing: 04/10/21 R vascular US 04/09/20 R knee XR 02/05/18 R knee MRI 01/16/18 R knee XR   Pertinent review of systems: No fevers or chills  Relevant historical information: Medial DJD right knee.   Exam:  BP (!) 148/88   Pulse 68   Ht 5' 6"  (1.676 m)   Wt 256 lb (116.1 kg)   LMP 10/03/2021   SpO2 98%   BMI 41.32 kg/m  General: Well Developed, well nourished, and in no acute distress.   MSK: Right knee mild joint effusion otherwise normal. Normal motion with crepitation.  Tender palpation medial joint line. Laxity to MCL stress test without pain.  No laxity to LCL stress test.  Pain felt at medial joint line during this test. Nondiagnostic anterior and posterior j drawer test due to guarding. Positive medial McMurray's test although guarding present with exam testing. Intact strength. Pulses cap refill and sensation are intact distally.   Lab and Radiology Results  Procedure: Real-time Ultrasound Guided Injection of right knee superior lateral patellar space Device: Philips Affiniti 50G Images permanently stored and available for review in PACS Verbal informed consent obtained.  Discussed risks and benefits of procedure. Warned  about infection, bleeding, hyperglycemia damage to structures among others. Patient expresses understanding and agreement Time-out conducted.   Noted no overlying erythema, induration, or other signs of local infection.   Skin prepped in a sterile fashion.   Local anesthesia: Topical Ethyl chloride.   With sterile technique and under real time ultrasound guidance: 40 mg of Kenalog and 2 mL of Marcaine injected into the knee joint. Fluid seen entering the joint capsule.   Completed without difficulty   Pain immediately resolved suggesting accurate placement of the medication.   Advised to call if fevers/chills, erythema, induration, drainage, or persistent bleeding.   Images permanently stored and available for review in the ultrasound unit.  Impression: Technically successful ultrasound guided injection.    X-ray images right knee obtained today personally and independently interpreted Severe medial compartment DJD.  No acute fractures are visible. Await formal radiology review    Assessment and Plan: 46 y.o. female with right knee pain.  This is an exacerbation of her chronic pain.  Patient suffered an injury at work when she became entangled with her tripped over a wet floor sign.  Most likely explanation for her pain is exacerbation of existing DJD versus meniscus tear.  Plan for steroid injection.  I have written her to return to work today if able.  I think that she will be able to do her job however if she just cannot we will modify work restriction.  I advised her to proceed to [employer]  Compensation claim for this.  I like to see her back in about 2 weeks. Recommend temporary NSAIDs but against high-dose oral NSAIDs for prolonged period of time.  PDMP not reviewed this encounter. Orders Placed This Encounter  Procedures   [place] LIMITED JOINT SPACE STRUCTURES UP RIGHT(NO LINKED CHARGES)    Order Specific Question:   Reason for Exam (SYMPTOM  OR DIAGNOSIS REQUIRED)    Answer:   right  knee pain    Order Specific Question:   Preferred imaging location?    Answer:   [facility]   DG Knee AP/LAT W/Sun[facility] Right    Standing Status:   Future    Number of Occurrences:   1    Standing Expiration Date:   12/18/2021    Order Specific Question:   Reason for Exam (SYMPTOM  OR DIAGNOSIS REQUIRED)    Answer:   right knee pain    Order Specific Question:   Preferred imaging location?    Answer:   [facility]    Order Specific Question:   Is patient pregnant?    Answer:   No   No orders of the defined types were placed in this encounter.    Discussed warning signs or symptoms. Please see discharge instructions. Patient expresses understanding.   The above documentation has been reviewed and is accurate and complete Lynne Leader, M.D.

## 2021-11-18 ENCOUNTER — Encounter (INDEPENDENT_AMBULATORY_CARE_PROVIDER_SITE_OTHER): Payer: Self-pay | Admitting: Family Medicine

## 2021-11-18 ENCOUNTER — Ambulatory Visit (INDEPENDENT_AMBULATORY_CARE_PROVIDER_SITE_OTHER): Payer: BC Managed Care – PPO | Admitting: Family Medicine

## 2021-11-18 VITALS — BP 118/72 | HR 83 | Temp 97.9°F | Ht 66.0 in | Wt 251.0 lb

## 2021-11-18 DIAGNOSIS — I10 Essential (primary) hypertension: Secondary | ICD-10-CM

## 2021-11-18 DIAGNOSIS — E559 Vitamin D deficiency, unspecified: Secondary | ICD-10-CM

## 2021-11-18 DIAGNOSIS — Z6841 Body Mass Index (BMI) 40.0 and over, adult: Secondary | ICD-10-CM

## 2021-11-18 DIAGNOSIS — E669 Obesity, unspecified: Secondary | ICD-10-CM | POA: Diagnosis not present

## 2021-11-18 DIAGNOSIS — D508 Other iron deficiency anemias: Secondary | ICD-10-CM

## 2021-11-18 MED ORDER — VITAMIN D (ERGOCALCIFEROL) 1.25 MG (50000 UNIT) PO CAPS: 50000.0000 [IU] | ORAL_CAPSULE | ORAL | 0 refills | Status: DC

## 2021-11-18 NOTE — Progress Notes (Signed)
Right knee x-ray shows significant arthritis changes.

## 2021-11-22 ENCOUNTER — Other Ambulatory Visit: Payer: Self-pay

## 2021-11-22 ENCOUNTER — Encounter: Payer: Self-pay | Admitting: Family Medicine

## 2021-11-22 ENCOUNTER — Inpatient Hospital Stay: Payer: BC Managed Care – PPO

## 2021-11-22 VITALS — BP 159/83 | HR 86 | Temp 97.7°F | Resp 17

## 2021-11-22 DIAGNOSIS — N920 Excessive and frequent menstruation with regular cycle: Secondary | ICD-10-CM | POA: Diagnosis not present

## 2021-11-22 DIAGNOSIS — D508 Other iron deficiency anemias: Secondary | ICD-10-CM

## 2021-11-22 DIAGNOSIS — Z4509 Encounter for adjustment and management of other cardiac device: Secondary | ICD-10-CM | POA: Diagnosis not present

## 2021-11-22 DIAGNOSIS — R002 Palpitations: Secondary | ICD-10-CM | POA: Diagnosis not present

## 2021-11-22 DIAGNOSIS — R55 Syncope and collapse: Secondary | ICD-10-CM | POA: Diagnosis not present

## 2021-11-22 DIAGNOSIS — D509 Iron deficiency anemia, unspecified: Secondary | ICD-10-CM | POA: Diagnosis not present

## 2021-11-22 DIAGNOSIS — I493 Ventricular premature depolarization: Secondary | ICD-10-CM | POA: Diagnosis not present

## 2021-11-22 MED ORDER — HEPARIN SOD (PORK) LOCK FLUSH 100 UNIT/ML IV SOLN: 500.0000 [IU] | Freq: Once | INTRAVENOUS | Status: DC | PRN

## 2021-11-22 MED ORDER — SODIUM CHLORIDE 0.9% FLUSH: 3.0000 mL | Freq: Once | INTRAVENOUS | Status: DC | PRN

## 2021-11-22 MED ORDER — SODIUM CHLORIDE 0.9 % IV SOLN
300.0000 mg | Freq: Once | INTRAVENOUS | Status: AC
  Administered 2021-11-22: 300 mg via INTRAVENOUS
  Filled 2021-11-22: qty 300

## 2021-11-22 MED ORDER — SODIUM CHLORIDE 0.9% FLUSH: 10.0000 mL | Freq: Once | INTRAVENOUS | Status: DC | PRN

## 2021-11-22 MED ORDER — HEPARIN SOD (PORK) LOCK FLUSH 100 UNIT/ML IV SOLN: 250.0000 [IU] | Freq: Once | INTRAVENOUS | Status: DC | PRN

## 2021-11-22 MED ORDER — ACETAMINOPHEN 325 MG PO TABS: 650.0000 mg | ORAL_TABLET | Freq: Once | ORAL | Status: DC

## 2021-11-22 MED ORDER — SODIUM CHLORIDE 0.9 % IV SOLN: Freq: Once | INTRAVENOUS | Status: AC

## 2021-11-22 MED ORDER — LORATADINE 10 MG PO TABS
10.0000 mg | ORAL_TABLET | Freq: Once | ORAL | Status: AC
  Administered 2021-11-22: 10 mg via ORAL
  Filled 2021-11-22: qty 1

## 2021-11-22 MED ORDER — ALTEPLASE 2 MG IJ SOLR: 2.0000 mg | Freq: Once | INTRAMUSCULAR | Status: DC | PRN

## 2021-11-22 NOTE — Patient Instructions (Signed)

## 2021-11-28 NOTE — Progress Notes (Signed)
Chief Complaint:   OBESITY Jacqueline Griffin is here to discuss her progress with her obesity treatment plan along with follow-up of her obesity related diagnoses. Jacqueline Griffin is on Category 3 and states she is following her eating plan approximately 20% of the time. Jacqueline Griffin states she is walking 9,000 steps times per week.  Today's visit was #: 32 Starting weight: 263 lbs Starting date: 10/06/2020 Today's weight: 251 lbs Today's date: 11/18/2021 Total lbs lost to date: 12 lbs Total lbs lost since last in-office visit: 5 lbs  Interim History: Jacqueline Griffin reports to she has been busy and has had other things be her main focus of the last few weeks. She is doing majority of grab an go options. She has been trying to stay more mindful of choices when eating out. Jacqueline Griffin reports getting a cortisone shot in her her knee yesterday.  Subjective:   1. Essential hypertension Jacqueline Griffin's blood pressure was elevated at her other [place] offices. Today, her blood pressure was within normal limits. She reports no chest pain, chest pressure or headaches.  2. Other iron deficiency anemia Jacqueline Griffin is to undergo another two rounds of IV Iron treatments (received one this week). She has an appointment with her Gynecologist to discuss options for menstrual blood loss.  3. Vitamin D deficiency She is currently taking prescription vitamin D 50,000 IU each week. She denies nausea, vomiting or muscle weakness, but does report fatigue.  Lab Results  Component Value Date   VD25OH 29.3 (L) 05/06/2021   VD25OH 16.0 (L) 10/06/2020    Assessment/Plan:   1. Essential hypertension We will continue to monitor her blood pressure; not currently take any medications at this time.  2. Other iron deficiency anemia Jacqueline Griffin will follow up with her Hematologist and has already scheduled an appointment.  3. Vitamin D deficiency We will refill Vitamin D as follows: - Vitamin D, Ergocalciferol, (DRISDOL) 1.25 MG (50000 UNIT) CAPS  capsule; Take 1 capsule (50,000 Units total) by mouth every 7 (seven) days.  Dispense: 4 capsule; Refill: 0  4. Obesity with current BMI of 40.6 Jacqueline Griffin is currently in the action stage of change. As such, her goal is to continue with weight loss efforts. She has agreed to keeping a food journal and adhering to recommended goals of 1600-1700 calories and 95+ grams of  protein daily.   Exercise goals: All adults should avoid inactivity. Some physical activity is better than none, and adults who participate in any amount of physical activity gain some health benefits.  Behavioral modification strategies: increasing lean protein intake, meal planning and cooking strategies, keeping healthy foods in the home, planning for success, and keeping a strict food journal.  Jacqueline Griffin has agreed to follow-up with our clinic in 3 weeks. She was informed of the importance of frequent follow-up visits to maximize her success with intensive lifestyle modifications for her multiple health conditions.   Objective:   Blood pressure 118/72, pulse 83, temperature 97.9 F (36.6 C), height 5' 6"  (1.676 m), weight 251 lb (113.9 kg), last menstrual period 11/13/2021, SpO2 98 %. Body mass index is 40.51 kg/m.  General: Cooperative, alert, well developed, in no acute distress. HEENT: Conjunctivae and lids unremarkable. Cardiovascular: Regular rhythm.  Lungs: Normal work of breathing. Neurologic: No focal deficits.   Lab Results  Component Value Date   CREATININE 0.53 11/08/2021   BUN 10 11/08/2021   NA 135 11/08/2021   K 3.7 11/08/2021   CL 106 11/08/2021   CO2 26 11/08/2021  Lab Results  Component Value Date   ALT 6 11/08/2021   AST 10 (L) 11/08/2021   ALKPHOS 76 11/08/2021   BILITOT 0.6 11/08/2021   Lab Results  Component Value Date   HGBA1C 5.5 05/06/2021   HGBA1C 5.5 10/06/2020   Lab Results  Component Value Date   INSULIN 13.5 05/06/2021   INSULIN 22.0 10/06/2020   Lab Results  Component  Value Date   TSH 1.490 10/06/2020   Lab Results  Component Value Date   CHOL 184 05/06/2021   HDL 49 05/06/2021   LDLCALC 117 (H) 05/06/2021   TRIG 98 05/06/2021   CHOLHDL 3.8 05/06/2021   Lab Results  Component Value Date   VD25OH 29.3 (L) 05/06/2021   VD25OH 16.0 (L) 10/06/2020   Lab Results  Component Value Date   WBC 10.6 (H) 11/08/2021   HGB 12.0 11/08/2021   HCT 36.5 11/08/2021   MCV 75.1 (L) 11/08/2021   PLT 309 11/08/2021   Lab Results  Component Value Date   IRON 41 11/08/2021   TIBC 344 11/08/2021   FERRITIN 33 11/08/2021   Attestation Statements:   Reviewed by clinician on day of visit: allergies, medications, problem list, medical history, surgical history, family history, social history, and previous encounter notes.  ILennette Bihari, CMA, am acting as transcriptionist for Dr. Coralie Common, MD.   I have reviewed the above documentation for accuracy and completeness, and I agree with the above. - Coralie Common, MD

## 2021-12-02 ENCOUNTER — Ambulatory Visit: Payer: BC Managed Care – PPO | Admitting: Family Medicine

## 2021-12-06 ENCOUNTER — Other Ambulatory Visit: Payer: Self-pay

## 2021-12-06 ENCOUNTER — Inpatient Hospital Stay: Payer: BC Managed Care – PPO | Attending: Hematology

## 2021-12-06 VITALS — BP 134/72 | HR 83 | Resp 16

## 2021-12-06 DIAGNOSIS — D508 Other iron deficiency anemias: Secondary | ICD-10-CM

## 2021-12-06 DIAGNOSIS — D509 Iron deficiency anemia, unspecified: Secondary | ICD-10-CM | POA: Insufficient documentation

## 2021-12-06 MED ORDER — SODIUM CHLORIDE 0.9 % IV SOLN
300.0000 mg | Freq: Once | INTRAVENOUS | Status: AC
  Administered 2021-12-06: 300 mg via INTRAVENOUS
  Filled 2021-12-06: qty 300

## 2021-12-06 MED ORDER — SODIUM CHLORIDE 0.9 % IV SOLN: Freq: Once | INTRAVENOUS | Status: AC

## 2021-12-06 MED ORDER — LORATADINE 10 MG PO TABS
10.0000 mg | ORAL_TABLET | Freq: Once | ORAL | Status: AC
  Administered 2021-12-06: 10 mg via ORAL
  Filled 2021-12-06: qty 1

## 2021-12-06 NOTE — Progress Notes (Signed)
Pt declined to be observed for 30 minutes post Venofer infusion. Pt tolerated trtmt well w/out incident. VSS at discharge.  Ambulatory to lobby in stable condition w/ no complaints.

## 2021-12-06 NOTE — Patient Instructions (Signed)

## 2021-12-15 ENCOUNTER — Encounter (INDEPENDENT_AMBULATORY_CARE_PROVIDER_SITE_OTHER): Payer: Self-pay | Admitting: Family Medicine

## 2021-12-15 ENCOUNTER — Ambulatory Visit (INDEPENDENT_AMBULATORY_CARE_PROVIDER_SITE_OTHER): Payer: BC Managed Care – PPO | Admitting: Family Medicine

## 2021-12-15 VITALS — BP 154/77 | HR 61 | Temp 97.9°F | Ht 66.0 in | Wt 250.0 lb

## 2021-12-15 DIAGNOSIS — E669 Obesity, unspecified: Secondary | ICD-10-CM | POA: Diagnosis not present

## 2021-12-15 DIAGNOSIS — Z6841 Body Mass Index (BMI) 40.0 and over, adult: Secondary | ICD-10-CM

## 2021-12-15 DIAGNOSIS — E559 Vitamin D deficiency, unspecified: Secondary | ICD-10-CM

## 2021-12-15 DIAGNOSIS — D508 Other iron deficiency anemias: Secondary | ICD-10-CM | POA: Diagnosis not present

## 2021-12-15 DIAGNOSIS — G4733 Obstructive sleep apnea (adult) (pediatric): Secondary | ICD-10-CM | POA: Diagnosis not present

## 2021-12-15 MED ORDER — VITAMIN D (ERGOCALCIFEROL) 1.25 MG (50000 UNIT) PO CAPS: 50000.0000 [IU] | ORAL_CAPSULE | ORAL | 0 refills | Status: DC

## 2021-12-16 NOTE — Progress Notes (Signed)
Chief Complaint:   OBESITY Jacqueline Griffin is here to discuss her progress with her obesity treatment plan along with follow-up of her obesity related diagnoses. Jacqueline Griffin is on keeping a food journal and adhering to recommended goals of 1600-1700 calories and 95+ grams of protein and states she is following her eating plan approximately 30% of the time. Jacqueline Griffin states she has physical therapy 45 minutes 2 times per week.  Today's visit was #: 12 Starting weight: 263 lbs Starting date: 10/06/2020 Today's weight: 250 lbs Today's date: 12/15/2021 Total lbs lost to date: 13 lbs Total lbs lost since last in-office visit: 1  Interim History: Jacqueline Griffin has been eating mostly on plan but has not journaled. She recognizing that consistency is difficult. Her 46 year old is interested in starting to learn to make food. She does want to be more compliant.  Subjective:   1. Vitamin D deficiency Jacqueline Griffin is currently taking prescription Vit D 50,000 IU once a week. Denies any nausea, vomiting or muscle weakness. She notes fatigue.  2. Other iron deficiency anemia Jacqueline Griffin's last MCV 75.1 H/H within normal limits. Previously on iron infusions.  Assessment/Plan:   1. Vitamin D deficiency We will refill Vit D 50k IU once a week for 1 month with 0 refills.  -Refill Vitamin D, Ergocalciferol, (DRISDOL) 1.25 MG (50000 UNIT) CAPS capsule; Take 1 capsule (50,000 Units total) by mouth every 7 (seven) days.  Dispense: 4 capsule; Refill: 0  2. Other iron deficiency anemia Follow up with hematology at next scheduled appointment.  3. Obesity with current BMI of 40.4 Jacqueline Griffin is currently in the action stage of change. As such, her goal is to continue with weight loss efforts. She has agreed to keeping a food journal and adhering to recommended goals of 1600-1700 calories and 95+ grams of protein daily.   Exercise goals: All adults should avoid inactivity. Some physical activity is better than none, and adults who  participate in any amount of physical activity gain some health benefits.  Behavioral modification strategies: increasing lean protein intake, meal planning and cooking strategies, keeping healthy foods in the home, and planning for success.  Jacqueline Griffin has agreed to follow-up with our clinic in 4 weeks. She was informed of the importance of frequent follow-up visits to maximize her success with intensive lifestyle modifications for her multiple health conditions.   Objective:   Blood pressure (!) 154/77, pulse 61, temperature 97.9 F (36.6 C), height 5' 6"  (1.676 m), weight 250 lb (113.4 kg), last menstrual period 11/13/2021, SpO2 99 %. Body mass index is 40.35 kg/m.  General: Cooperative, alert, well developed, in no acute distress. HEENT: Conjunctivae and lids unremarkable. Cardiovascular: Regular rhythm.  Lungs: Normal work of breathing. Neurologic: No focal deficits.   Lab Results  Component Value Date   CREATININE 0.53 11/08/2021   BUN 10 11/08/2021   NA 135 11/08/2021   K 3.7 11/08/2021   CL 106 11/08/2021   CO2 26 11/08/2021   Lab Results  Component Value Date   ALT 6 11/08/2021   AST 10 (L) 11/08/2021   ALKPHOS 76 11/08/2021   BILITOT 0.6 11/08/2021   Lab Results  Component Value Date   HGBA1C 5.5 05/06/2021   HGBA1C 5.5 10/06/2020   Lab Results  Component Value Date   INSULIN 13.5 05/06/2021   INSULIN 22.0 10/06/2020   Lab Results  Component Value Date   TSH 1.490 10/06/2020   Lab Results  Component Value Date   CHOL 184 05/06/2021  HDL 49 05/06/2021   LDLCALC 117 (H) 05/06/2021   TRIG 98 05/06/2021   CHOLHDL 3.8 05/06/2021   Lab Results  Component Value Date   VD25OH 29.3 (L) 05/06/2021   VD25OH 16.0 (L) 10/06/2020   Lab Results  Component Value Date   WBC 10.6 (H) 11/08/2021   HGB 12.0 11/08/2021   HCT 36.5 11/08/2021   MCV 75.1 (L) 11/08/2021   PLT 309 11/08/2021   Lab Results  Component Value Date   IRON 41 11/08/2021   TIBC 344  11/08/2021   FERRITIN 33 11/08/2021   Attestation Statements:   Reviewed by clinician on day of visit: allergies, medications, problem list, medical history, surgical history, family history, social history, and previous encounter notes.  I, Elnora Morrison, RMA am acting as transcriptionist for Coralie Common, MD.  I have reviewed the above documentation for accuracy and completeness, and I agree with the above. - Coralie Common, MD

## 2021-12-17 ENCOUNTER — Ambulatory Visit: Payer: BC Managed Care – PPO | Admitting: Family Medicine

## 2021-12-23 DIAGNOSIS — Z4509 Encounter for adjustment and management of other cardiac device: Secondary | ICD-10-CM | POA: Diagnosis not present

## 2021-12-26 DIAGNOSIS — R002 Palpitations: Secondary | ICD-10-CM | POA: Diagnosis not present

## 2021-12-26 DIAGNOSIS — R55 Syncope and collapse: Secondary | ICD-10-CM | POA: Diagnosis not present

## 2021-12-26 DIAGNOSIS — Z4509 Encounter for adjustment and management of other cardiac device: Secondary | ICD-10-CM | POA: Diagnosis not present

## 2022-01-04 DIAGNOSIS — J069 Acute upper respiratory infection, unspecified: Secondary | ICD-10-CM | POA: Diagnosis not present

## 2022-01-04 DIAGNOSIS — R051 Acute cough: Secondary | ICD-10-CM | POA: Diagnosis not present

## 2022-01-04 DIAGNOSIS — Z6839 Body mass index (BMI) 39.0-39.9, adult: Secondary | ICD-10-CM | POA: Diagnosis not present

## 2022-01-12 ENCOUNTER — Ambulatory Visit (INDEPENDENT_AMBULATORY_CARE_PROVIDER_SITE_OTHER): Payer: BC Managed Care – PPO | Admitting: Family Medicine

## 2022-01-14 DIAGNOSIS — G4733 Obstructive sleep apnea (adult) (pediatric): Secondary | ICD-10-CM | POA: Diagnosis not present

## 2022-01-16 DIAGNOSIS — Z6841 Body Mass Index (BMI) 40.0 and over, adult: Secondary | ICD-10-CM | POA: Diagnosis not present

## 2022-01-16 DIAGNOSIS — J209 Acute bronchitis, unspecified: Secondary | ICD-10-CM | POA: Diagnosis not present

## 2022-01-16 DIAGNOSIS — R051 Acute cough: Secondary | ICD-10-CM | POA: Diagnosis not present

## 2022-01-17 DIAGNOSIS — F4389 Other reactions to severe stress: Secondary | ICD-10-CM | POA: Diagnosis not present

## 2022-01-20 ENCOUNTER — Encounter: Payer: Self-pay | Admitting: Hematology

## 2022-01-24 DIAGNOSIS — Z4509 Encounter for adjustment and management of other cardiac device: Secondary | ICD-10-CM | POA: Diagnosis not present

## 2022-01-24 DIAGNOSIS — I493 Ventricular premature depolarization: Secondary | ICD-10-CM | POA: Diagnosis not present

## 2022-01-27 ENCOUNTER — Ambulatory Visit (INDEPENDENT_AMBULATORY_CARE_PROVIDER_SITE_OTHER): Payer: BC Managed Care – PPO | Admitting: Family Medicine

## 2022-01-27 ENCOUNTER — Encounter (INDEPENDENT_AMBULATORY_CARE_PROVIDER_SITE_OTHER): Payer: Self-pay | Admitting: Family Medicine

## 2022-01-27 VITALS — BP 148/81 | HR 71 | Temp 98.0°F | Ht 66.0 in | Wt 258.0 lb

## 2022-01-27 DIAGNOSIS — J329 Chronic sinusitis, unspecified: Secondary | ICD-10-CM | POA: Diagnosis not present

## 2022-01-27 DIAGNOSIS — Z6841 Body Mass Index (BMI) 40.0 and over, adult: Secondary | ICD-10-CM

## 2022-01-27 DIAGNOSIS — E669 Obesity, unspecified: Secondary | ICD-10-CM | POA: Diagnosis not present

## 2022-01-27 DIAGNOSIS — E559 Vitamin D deficiency, unspecified: Secondary | ICD-10-CM | POA: Diagnosis not present

## 2022-01-27 MED ORDER — SULFAMETHOXAZOLE-TRIMETHOPRIM 800-160 MG PO TABS: 1.0000 | ORAL_TABLET | Freq: Two times a day (BID) | ORAL | 0 refills | Status: DC

## 2022-01-31 DIAGNOSIS — R002 Palpitations: Secondary | ICD-10-CM | POA: Diagnosis not present

## 2022-01-31 DIAGNOSIS — Z4509 Encounter for adjustment and management of other cardiac device: Secondary | ICD-10-CM | POA: Diagnosis not present

## 2022-01-31 DIAGNOSIS — R55 Syncope and collapse: Secondary | ICD-10-CM | POA: Diagnosis not present

## 2022-01-31 NOTE — Progress Notes (Signed)
Chief Complaint:   OBESITY Jacqueline Griffin is here to discuss her progress with her obesity treatment plan along with follow-up of her obesity related diagnoses. Jacqueline Griffin is on keeping a food journal and adhering to recommended goals of 1600-17000 calories and 95+ grams of protein and states she is following her eating plan approximately 50% of the time. Jacqueline Griffin states she is exercising 0 minutes 0 times per week.  Today's visit was #: 37 Starting weight: 236 lbs Starting date: 10/06/2020 Today's weight: 258 lbs Today's date: 01/27/2022 Total lbs lost to date: 0 lbs Total lbs lost since last in-office visit: 0  Interim History: Jacqueline Griffin is returning to clinic from 1 month ago. Has had laryngitis around 2 weeks ago and is still struggling with some loss of voice. She is eating breakfast every am--2 eggs, 2 slices of [place] bacon. Dinner has been, meat and not so many vegetables. Started eating more potatoes. Has been trying to write down food choices.  Subjective:   1. Vitamin D deficiency Jacqueline Griffin is currently taking prescription Vit D 50,000 IU once a week. She notes fatigue. Denies any nausea, vomiting or muscle weakness. Vit D level of 29 in Feb 2023.  2. Sinusitis, unspecified chronicity, unspecified location Jacqueline Griffin has had symptoms for 2 weeks of mucopurulent discharge, rhinorrhea cough and sinus pressure.  Assessment/Plan:   1. Vitamin D deficiency Continue taking Vit D weekly.  2. Sinusitis, unspecified chronicity, unspecified location We will refill Bactrim DS 800-160 mg by month twice a day for 1 month with 0 rerills.  -Refill sulfamethoxazole-trimethoprim (BACTRIM DS) 800-160 MG tablet; Take 1 tablet by mouth 2 (two) times daily.  Dispense: 20 tablet; Refill: 0  3. Obesity with current BMI of 41.7 Jacqueline Griffin is currently in the action stage of change. As such, her goal is to continue with weight loss efforts. She has agreed to practicing portion control and making smarter food  choices, such as increasing vegetables and decreasing simple carbohydrates.   Exercise goals: All adults should avoid inactivity. Some physical activity is better than none, and adults who participate in any amount of physical activity gain some health benefits.  Behavioral modification strategies: increasing lean protein intake, meal planning and cooking strategies, keeping healthy foods in the home, and planning for success.  Jacqueline Griffin has agreed to follow-up with our clinic in 8 weeks. She was informed of the importance of frequent follow-up visits to maximize her success with intensive lifestyle modifications for her multiple health conditions.   Objective:   Blood pressure (!) 148/81, pulse 71, temperature 98 F (36.7 C), height 5' 6"  (1.676 m), weight 258 lb (117 kg), SpO2 99 %. Body mass index is 41.64 kg/m.  General: Cooperative, alert, well developed, in no acute distress. HEENT: Conjunctivae and lids unremarkable. Cardiovascular: Regular rhythm.  Lungs: Normal work of breathing. Neurologic: No focal deficits.   Lab Results  Component Value Date   CREATININE 0.53 11/08/2021   BUN 10 11/08/2021   NA 135 11/08/2021   K 3.7 11/08/2021   CL 106 11/08/2021   CO2 26 11/08/2021   Lab Results  Component Value Date   ALT 6 11/08/2021   AST 10 (L) 11/08/2021   ALKPHOS 76 11/08/2021   BILITOT 0.6 11/08/2021   Lab Results  Component Value Date   HGBA1C 5.5 05/06/2021   HGBA1C 5.5 10/06/2020   Lab Results  Component Value Date   INSULIN 13.5 05/06/2021   INSULIN 22.0 10/06/2020   Lab Results  Component Value Date  TSH 1.490 10/06/2020   Lab Results  Component Value Date   CHOL 184 05/06/2021   HDL 49 05/06/2021   LDLCALC 117 (H) 05/06/2021   TRIG 98 05/06/2021   CHOLHDL 3.8 05/06/2021   Lab Results  Component Value Date   VD25OH 29.3 (L) 05/06/2021   VD25OH 16.0 (L) 10/06/2020   Lab Results  Component Value Date   WBC 10.6 (H) 11/08/2021   HGB 12.0  11/08/2021   HCT 36.5 11/08/2021   MCV 75.1 (L) 11/08/2021   PLT 309 11/08/2021   Lab Results  Component Value Date   IRON 41 11/08/2021   TIBC 344 11/08/2021   FERRITIN 33 11/08/2021   Attestation Statements:   Reviewed by clinician on day of visit: allergies, medications, problem list, medical history, surgical history, family history, social history, and previous encounter notes.  I, Elnora Morrison, RMA am acting as transcriptionist for Coralie Common, MD.  I have reviewed the above documentation for accuracy and completeness, and I agree with the above. - Coralie Common, MD

## 2022-02-14 DIAGNOSIS — G4733 Obstructive sleep apnea (adult) (pediatric): Secondary | ICD-10-CM | POA: Diagnosis not present

## 2022-02-24 DIAGNOSIS — Z4509 Encounter for adjustment and management of other cardiac device: Secondary | ICD-10-CM | POA: Diagnosis not present

## 2022-02-24 DIAGNOSIS — R002 Palpitations: Secondary | ICD-10-CM | POA: Diagnosis not present

## 2022-02-24 DIAGNOSIS — R55 Syncope and collapse: Secondary | ICD-10-CM | POA: Diagnosis not present

## 2022-02-25 DIAGNOSIS — H04123 Dry eye syndrome of bilateral lacrimal glands: Secondary | ICD-10-CM | POA: Diagnosis not present

## 2022-02-25 DIAGNOSIS — H15101 Unspecified episcleritis, right eye: Secondary | ICD-10-CM | POA: Diagnosis not present

## 2022-02-25 DIAGNOSIS — F4389 Other reactions to severe stress: Secondary | ICD-10-CM | POA: Diagnosis not present

## 2022-03-04 DIAGNOSIS — Z4509 Encounter for adjustment and management of other cardiac device: Secondary | ICD-10-CM | POA: Diagnosis not present

## 2022-03-04 DIAGNOSIS — R55 Syncope and collapse: Secondary | ICD-10-CM | POA: Diagnosis not present

## 2022-03-04 DIAGNOSIS — R002 Palpitations: Secondary | ICD-10-CM | POA: Diagnosis not present

## 2022-03-10 ENCOUNTER — Ambulatory Visit
Admission: EM | Admit: 2022-03-10 | Discharge: 2022-03-10 | Disposition: A | Discharge status: Home / Self Care | Payer: BC Managed Care – PPO | Attending: Family Medicine | Admitting: Family Medicine

## 2022-03-10 DIAGNOSIS — R0981 Nasal congestion: Secondary | ICD-10-CM | POA: Diagnosis not present

## 2022-03-10 DIAGNOSIS — J069 Acute upper respiratory infection, unspecified: Secondary | ICD-10-CM | POA: Diagnosis not present

## 2022-03-10 MED ORDER — FLUTICASONE PROPIONATE 50 MCG/ACT NA SUSP: 2.0000 | Freq: Every day | NASAL | 0 refills | Status: DC

## 2022-03-10 NOTE — ED Provider Notes (Signed)
Jacqueline Griffin CARE    CSN: [id number] Arrival date & time: 03/10/22  1603      History   Chief Complaint Chief Complaint  Patient presents with   Nasal Congestion   Facial Pain    HPI Jacqueline Griffin is a 46 y.o. female.    HPI Patient has been getting sick since yesterday.  Nasal congestion, facial pain, postnasal drip, some ear and jaw pain.  She denies fever or chills, headache or body aches.  She states she does feel very tired.  Unknown exposure to illness.  Past Medical History:  Diagnosis Date   Anemia    Anxiety    Chronic GERD    Depression    Fatty liver    Food allergy    Hypertension 09/18/2017   Iron deficiency    Joint pain    Morbid obesity ([place]) 04/01/2012   NASH (nonalcoholic steatohepatitis) 11/28/2012   OSA (obstructive sleep apnea)    OSA on CPAP 04/29/2016   Palpitations    Paroxysmal supraventricular tachycardia 04/01/2009   Overview:    Supraventricular tachycardia 1997   Symptomatic PVCs    Thyroid nodule 11/07/2014   Uterine fibroid     Patient Active Problem List   Diagnosis Date Noted   Vitamin D deficiency 10/07/2020   Iron deficiency anemia 10/07/2020   Hearing loss 09/18/2017   Heart burn 09/18/2017   Hypertension 09/18/2017   OSA on CPAP 04/29/2016   Thyroid nodule 11/07/2014   NASH (nonalcoholic steatohepatitis) 11/28/2012   Morbid obesity ([place]) 04/01/2012   Menorrhagia 09/25/2010   Paroxysmal supraventricular tachycardia 04/01/2009    Past Surgical History:  Procedure Laterality Date   CESAREAN SECTION     HIP SURGERY     MANDIBLE RECONSTRUCTION  46 yo    OB History     Gravida  2   Para      Term      Preterm      AB  1   Living  0      SAB  1   IAB      Ectopic      Multiple      Live Births               Home Medications    Prior to Admission medications   Medication Sig Start Date End Date Taking? Authorizing Provider  fluticasone (FLONASE) 50 MCG/ACT nasal spray  Place 2 sprays into both nostrils daily. 03/10/22  Yes Raylene Everts, MD  metoprolol succinate (TOPROL-XL) 100 MG 24 hr tablet Take 100 mg by mouth daily. Take with or immediately following a meal.    [provider]  Vitamin D, Ergocalciferol, (DRISDOL) 1.25 MG (50000 UNIT) CAPS capsule Take 1 capsule (50,000 Units total) by mouth every 7 (seven) days. 12/15/21   Laqueta Linden, MD    Family History Family History  Problem Relation Age of Onset   Leukemia Mother    Hypertension Father    Diabetes Father    Heart disease Father    Kidney disease Father    Sleep apnea Father    Drug abuse Father    Obesity Father     Social History Social History   Tobacco Use   Smoking status: Never   Smokeless tobacco: Never  Vaping Use   Vaping Use: Never used  Substance Use Topics   Alcohol use: No   Drug use: No     Allergies   Shrimp [shellfish allergy], Amoxicillin,  Cephalosporins, Ciprofloxacin, Clindamycin/lincomycin, Doxycycline, and Shrimp flavor   Review of Systems Review of Systems See HPI  Physical Exam Triage Vital Signs ED Triage Vitals  Enc Vitals Group     BP 03/10/22 1615 (!) 144/79     Pulse Rate 03/10/22 1612 89     Resp 03/10/22 1612 17     Temp 03/10/22 1612 98.2 F (36.8 C)     Temp Source 03/10/22 1612 Oral     SpO2 03/10/22 1612 98 %     Weight --      Height --      Head Circumference --      Peak Flow --      Pain Score 03/10/22 1613 3     Pain Loc --      Pain Edu? --      Excl. in [place]? --    No data found.  Updated Vital Signs BP (!) 144/79   Pulse 89   Temp 98.2 F (36.8 C) (Oral)   Resp 17   SpO2 98%      Physical Exam Constitutional:      General: She is not in acute distress.    Appearance: She is well-developed. She is obese.  HENT:     Head: Normocephalic and atraumatic.     Right Ear: Tympanic membrane normal.     Left Ear: Tympanic membrane normal.     Nose: Congestion and rhinorrhea present.      Mouth/Throat:     Pharynx: No posterior oropharyngeal erythema.  Eyes:     Conjunctiva/sclera: Conjunctivae normal.     Pupils: Pupils are equal, round, and reactive to light.  Cardiovascular:     Rate and Rhythm: Normal rate.  Pulmonary:     Effort: Pulmonary effort is normal. No respiratory distress.  Abdominal:     General: There is no distension.     Palpations: Abdomen is soft.  Musculoskeletal:        General: Normal range of motion.     Cervical back: Normal range of motion.  Lymphadenopathy:     Cervical: No cervical adenopathy.  Skin:    General: Skin is warm and dry.  Neurological:     Mental Status: She is alert.      UC Treatments / Results  Labs (all labs ordered are listed, but only abnormal results are displayed) Labs Reviewed  RESP PANEL BY RT-PCR (FLU A&B, COVID) [id number]    EKG   Radiology No results found.  Procedures Procedures (including critical care time)  Medications Ordered in UC Medications - No data to display  Initial Impression / Assessment and Plan / UC Course  I have reviewed the triage vital signs and the nursing notes.  Pertinent labs & imaging results that were available during my care of the patient were reviewed by me and considered in my medical decision making (see chart for details).     Discussed that upper respiratory infections are almost entirely viral.  Symptomatic care is reviewed Final Clinical Impressions(s) / UC Diagnoses   Final diagnoses:  Nasal congestion  Viral upper respiratory tract infection     Discharge Instructions      Use Flonase for the sinus congestion and drainage Drink lots of fluids Check MyChart for your test results.  We will swab for COVID and influenza.  You will be called if your tests are positive May take over-the-counter cough and cold medicines   ED Prescriptions     Medication Sig Dispense  Auth. Provider   fluticasone (FLONASE) 50 MCG/ACT nasal spray Place 2 sprays into  both nostrils daily. 16 g Raylene Everts, MD      PDMP not reviewed this encounter.   Raylene Everts, MD 03/10/22 [phone]

## 2022-03-10 NOTE — ED Triage Notes (Signed)
Pt c/o nasal congestion and facial pain x 2 days. Some ear/jaw pain. Denies fever. Taking Claritin prn.

## 2022-03-10 NOTE — ED Notes (Signed)
Pt refused viral testing.

## 2022-03-10 NOTE — Discharge Instructions (Signed)
Use Flonase for the sinus congestion and drainage Drink lots of fluids Check MyChart for your test results.  We will swab for COVID and influenza.  You will be called if your tests are positive May take over-the-counter cough and cold medicines

## 2022-03-11 ENCOUNTER — Inpatient Hospital Stay: Payer: BC Managed Care – PPO

## 2022-03-11 ENCOUNTER — Other Ambulatory Visit (INDEPENDENT_AMBULATORY_CARE_PROVIDER_SITE_OTHER): Payer: Self-pay | Admitting: Family Medicine

## 2022-03-11 ENCOUNTER — Inpatient Hospital Stay: Payer: BC Managed Care – PPO | Admitting: Hematology

## 2022-03-11 DIAGNOSIS — E559 Vitamin D deficiency, unspecified: Secondary | ICD-10-CM

## 2022-03-23 DIAGNOSIS — I471 Supraventricular tachycardia, unspecified: Secondary | ICD-10-CM | POA: Diagnosis not present

## 2022-03-23 DIAGNOSIS — R55 Syncope and collapse: Secondary | ICD-10-CM | POA: Diagnosis not present

## 2022-03-23 DIAGNOSIS — Z95818 Presence of other cardiac implants and grafts: Secondary | ICD-10-CM | POA: Diagnosis not present

## 2022-03-23 DIAGNOSIS — I493 Ventricular premature depolarization: Secondary | ICD-10-CM | POA: Diagnosis not present

## 2022-03-25 ENCOUNTER — Other Ambulatory Visit: Payer: Self-pay

## 2022-03-25 DIAGNOSIS — D508 Other iron deficiency anemias: Secondary | ICD-10-CM

## 2022-03-25 DIAGNOSIS — R002 Palpitations: Secondary | ICD-10-CM | POA: Diagnosis not present

## 2022-03-25 DIAGNOSIS — R55 Syncope and collapse: Secondary | ICD-10-CM | POA: Diagnosis not present

## 2022-03-25 DIAGNOSIS — Z4509 Encounter for adjustment and management of other cardiac device: Secondary | ICD-10-CM | POA: Diagnosis not present

## 2022-03-30 ENCOUNTER — Other Ambulatory Visit: Payer: Self-pay

## 2022-03-30 ENCOUNTER — Inpatient Hospital Stay: Payer: BC Managed Care – PPO | Attending: Hematology | Admitting: Hematology

## 2022-03-30 ENCOUNTER — Inpatient Hospital Stay: Payer: BC Managed Care – PPO

## 2022-03-30 VITALS — BP 160/71 | HR 77 | Temp 97.3°F | Resp 18 | Wt 263.4 lb

## 2022-03-30 DIAGNOSIS — D509 Iron deficiency anemia, unspecified: Secondary | ICD-10-CM | POA: Insufficient documentation

## 2022-03-30 DIAGNOSIS — D563 Thalassemia minor: Secondary | ICD-10-CM | POA: Diagnosis not present

## 2022-03-30 DIAGNOSIS — D508 Other iron deficiency anemias: Secondary | ICD-10-CM

## 2022-03-30 DIAGNOSIS — N920 Excessive and frequent menstruation with regular cycle: Secondary | ICD-10-CM | POA: Insufficient documentation

## 2022-03-30 LAB — IRON AND IRON BINDING CAPACITY (CC-WL,HP ONLY)
Iron: 66 ug/dL (ref 28–170)
Saturation Ratios: 20 % (ref 10.4–31.8)
TIBC: 325 ug/dL (ref 250–450)
UIBC: 259 ug/dL (ref 148–442)

## 2022-03-30 LAB — CBC WITH DIFFERENTIAL (CANCER CENTER ONLY)
Abs Immature Granulocytes: 0.03 10*3/uL (ref 0.00–0.07)
Basophils Absolute: 0 10*3/uL (ref 0.0–0.1)
Basophils Relative: 0 %
Eosinophils Absolute: 0.1 10*3/uL (ref 0.0–0.5)
Eosinophils Relative: 1 %
HCT: 39.2 % (ref 36.0–46.0)
Hemoglobin: 13.1 g/dL (ref 12.0–15.0)
Immature Granulocytes: 0 %
Lymphocytes Relative: 16 %
Lymphs Abs: 1.9 10*3/uL (ref 0.7–4.0)
MCH: 26.7 pg (ref 26.0–34.0)
MCHC: 33.4 g/dL (ref 30.0–36.0)
MCV: 80 fL (ref 80.0–100.0)
Monocytes Absolute: 0.5 10*3/uL (ref 0.1–1.0)
Monocytes Relative: 5 %
Neutro Abs: 9.2 10*3/uL — ABNORMAL HIGH (ref 1.7–7.7)
Neutrophils Relative %: 78 %
Platelet Count: 299 10*3/uL (ref 150–400)
RBC: 4.9 MIL/uL (ref 3.87–5.11)
RDW: 15.5 % (ref 11.5–15.5)
WBC Count: 11.8 10*3/uL — ABNORMAL HIGH (ref 4.0–10.5)
nRBC: 0 % (ref 0.0–0.2)

## 2022-03-30 LAB — CMP (CANCER CENTER ONLY)
ALT: 6 U/L (ref 0–44)
AST: 11 U/L — ABNORMAL LOW (ref 15–41)
Albumin: 3.8 g/dL (ref 3.5–5.0)
Alkaline Phosphatase: 73 U/L (ref 38–126)
Anion gap: 5 (ref 5–15)
BUN: 8 mg/dL (ref 6–20)
CO2: 28 mmol/L (ref 22–32)
Calcium: 9.2 mg/dL (ref 8.9–10.3)
Chloride: 104 mmol/L (ref 98–111)
Creatinine: 0.44 mg/dL (ref 0.44–1.00)
GFR, Estimated: >60 mL/min (ref >60)
Glucose, Bld: 81 mg/dL (ref 70–99)
Potassium: 3.7 mmol/L (ref 3.5–5.1)
Sodium: 137 mmol/L (ref 135–145)
Total Bilirubin: 1 mg/dL (ref 0.3–1.2)
Total Protein: 7.3 g/dL (ref 6.5–8.1)

## 2022-03-30 LAB — FERRITIN: Ferritin: 58 ng/mL (ref 11–307)

## 2022-03-30 NOTE — Progress Notes (Signed)
HEMATOLOGY/ONCOLOGY CLINIC NOTE  Date of Service: 03/30/2022  Patient Care Team: Pcp, No as PCP - General  CHIEF COMPLAINTS/PURPOSE OF CONSULTATION:  Evaluation and consultation for iron deficiency anemia.  HISTORY OF PRESENTING ILLNESS: Jacqueline Griffin is a wonderful 47 y.o. female who has been referred to [place] by her Dr. Jearld Shines, Earvin Hansen, MD for evaluation and consultation for iron deficiency anemia. She reports She is doing well.  She notes heavy periods. She notes that in February of 2023 she was menstruating for almost a month. She reports excessive clotting during menstruation. She notes some fibroids.  We discussed that iron deficiency might be related to heavier menstruation.  She notes that she recently started taking the iron polysaccharide 150 mg 1x p.o daily. She says she does not like them as they change her bowel habits and give her some abdominal discomfort. We discussed trying the liquid form of iron polysaccharide and reducing frequency of intake to help with abdominal pain.   She reports some fatigue. She says that she is not able to move as quickly as she generally would.  She reports symptoms of PICA saying that she has been eating ice for the last 6-7 months.   We discussed getting IV iron such as Venofer of Injectafer based on labs during next visit.  We further discussed potential for alpha thalassemia trait.  No smoking or alcohol.  No fever, chills, or night sweats. No other new changes in bowel habits. No other new or acute focal symptoms.  Labs done 05/06/2021 were reviewed in detail. CBC stable.  CMP unremarkable. Iron sat ratio is 6%. Ferritin is 7.  INTERVAL HISTORY:   Jacqueline Griffin is a wonderful 47 y.o. female who is here for evaluation and consultation for iron deficiency anemia.   She was last seen by me on 11/08/21 and she complained of heavy clots while menstruating. Otherwise, she was doing well with no other medical  complaints.  Today, she is doing well overall. She is tolerating Iv iron without any side effects. Her menstrual cycles last 7 days and notes plenty of clotting and heaviness the first 5 days. She is following up with her OBGYN regarding heavy menstrual cycle. Patient does not want to be on birth control.   She denies any urinary problems, abdominal pain, fever, chills, night sweats, back pain, or leg swelling. However, she notes she had respiratory illness about a week ago.   Patient notes she used to take iron supplements in the past, but discontinued because it caused her digestive issues.   She notes that she gets her blood drawn every 6 months with [facility] weight-loss program.  MEDICAL HISTORY:  Past Medical History:  Diagnosis Date   Anemia    Anxiety    Chronic GERD    Depression    Fatty liver    Food allergy    Hypertension 09/18/2017   Iron deficiency    Joint pain    Morbid obesity ([place]) 04/01/2012   NASH (nonalcoholic steatohepatitis) 11/28/2012   OSA (obstructive sleep apnea)    OSA on CPAP 04/29/2016   Palpitations    Paroxysmal supraventricular tachycardia 04/01/2009   Overview:    Supraventricular tachycardia 1997   Symptomatic PVCs    Thyroid nodule 11/07/2014   Uterine fibroid     SURGICAL HISTORY: Past Surgical History:  Procedure Laterality Date   CESAREAN SECTION     HIP SURGERY     MANDIBLE RECONSTRUCTION  47 yo  SOCIAL HISTORY: Social History   Socioeconomic History   Marital status: Single    Spouse name: Not on file   Number of children: Not on file   Years of education: Not on file   Highest education level: Not on file  Occupational History   Occupation: Sales executive Document Specialist - [employer]  Tobacco Use   Smoking status: Never   Smokeless tobacco: Never  Vaping Use   Vaping Use: Never used  Substance and Sexual Activity   Alcohol use: No   Drug use: No   Sexual activity: Yes    Birth control/protection: Condom   Other Topics Concern   Not on file  Social History Narrative   Not on file   Social Determinants of Health   Financial Resource Strain: Not on file  Food Insecurity: Not on file  Transportation Needs: Not on file  Physical Activity: Not on file  Stress: Not on file  Social Connections: Not on file  Intimate Partner Violence: Not on file    FAMILY HISTORY: Family History  Problem Relation Age of Onset   Leukemia Mother    Hypertension Father    Diabetes Father    Heart disease Father    Kidney disease Father    Sleep apnea Father    Drug abuse Father    Obesity Father     ALLERGIES:  is allergic to shrimp [shellfish allergy], amoxicillin, cephalosporins, ciprofloxacin, clindamycin/lincomycin, doxycycline, and shrimp flavor.  MEDICATIONS:  Current Outpatient Medications  Medication Sig Dispense Refill   fluticasone (FLONASE) 50 MCG/ACT nasal spray Place 2 sprays into both nostrils daily. 16 g 0   metoprolol succinate (TOPROL-XL) 100 MG 24 hr tablet Take 100 mg by mouth daily. Take with or immediately following a meal.     Vitamin D, Ergocalciferol, (DRISDOL) 1.25 MG (50000 UNIT) CAPS capsule Take 1 capsule (50,000 Units total) by mouth every 7 (seven) days. 4 capsule 0   No current facility-administered medications for this visit.    REVIEW OF SYSTEMS:   10 Point review of Systems was done is negative except as noted above.  PHYSICAL EXAMINATION: Vitals:   03/30/22 1014  BP: (!) 160/71  Pulse: 77  Resp: 18  Temp: (!) 97.3 F (36.3 C)  SpO2: 100%    NAD GENERAL:alert, in no acute distress and comfortable SKIN: no acute rashes, no significant lesions EYES: conjunctiva are pink and non-injected, sclera anicteric NECK: supple, no JVD LYMPH:  no palpable lymphadenopathy in the cervical, axillary or inguinal regions LUNGS: clear to auscultation b/l with normal respiratory effort HEART: regular rate & rhythm ABDOMEN:  normoactive bowel sounds , non tender, not  distended. Extremity: no pedal edema PSYCH: alert & oriented x 3 with fluent speech NEURO: no focal motor/sensory deficits  LABORATORY DATA:  I have reviewed the data as listed  .[name]    Latest Ref Rng & Units 03/30/2022    9:55 AM 11/08/2021   12:12 PM 08/16/2021   12:11 PM  CBC  WBC 4.0 - 10.5 K/uL 11.8  10.6  10.2   Hemoglobin 12.0 - 15.0 g/dL 13.1  12.0  10.7   Hematocrit 36.0 - 46.0 % 39.2  36.5  34.2   Platelets 150 - 400 K/uL 299  309  358    .    Latest Ref Rng & Units 11/08/2021   12:12 PM 08/16/2021   12:11 PM 05/06/2021    9:31 AM  CMP  Glucose 70 - 99 mg/dL 101  87  74   BUN 6 - 20 mg/dL '10  8  8   '$ Creatinine 0.44 - 1.00 mg/dL 0.53  0.55  0.57   Sodium 135 - 145 mmol/L 135  138  140   Potassium 3.5 - 5.1 mmol/L 3.7  3.8  4.1   Chloride 98 - 111 mmol/L 106  104  102   CO2 22 - 32 mmol/L '26  29  21   '$ Calcium 8.9 - 10.3 mg/dL 8.9  9.0  9.0   Total Protein 6.5 - 8.1 g/dL 7.7  8.4  7.6   Total Bilirubin 0.3 - 1.2 mg/dL 0.6  0.6  0.6   Alkaline Phos 38 - 126 U/L 76  84  94   AST 15 - 41 U/L '10  13  17   '$ ALT 0 - 44 U/L '6  6  6    '$ . Lab Results  Component Value Date   IRON 41 11/08/2021   TIBC 344 11/08/2021   IRONPCTSAT 12 11/08/2021   (Iron and TIBC)  Lab Results  Component Value Date   FERRITIN 33 11/08/2021       RADIOGRAPHIC STUDIES: I have personally reviewed the radiological images as listed and agreed with the findings in the report. No results found.  US pelvis done 07/08/2021 revealed "Uterus: 9.48 x 5.23 x 6.82 cm. anteverted  Endometrial stripe: 11.17 mm  Fibroids: yes: 1) 3.49 x 3.45 x 3.30 cm and 2) 0.80 x 0.80 x 0.95 cm  Endometrial mass: none seen  Cervix: normal  Right ovary: normal, no masses  Left ovary: normal, no masses  Other findings of note: none   Stable fibroid uterus. Discussed in depth at her visit "    ASSESSMENT & PLAN:   47 y.o. very pleasant female with  1.Microcytic Anemia likely due to Iron deficiency  anemia -Labs done 05/06/2021 show iron sat of 6 and ferritin of 7. -Labs done 08/16/2021 show  Iron sat ratio is 5%. Ferritin is 5. -Labs done 03/30/2022 show iron sat of 12% and ferritin of 33.  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 Alpha thalassemia genotype revealed "Alpha-+-thalassemia trait, alpha alpha/alpha-  Mutation(s) identified: alpha3.7 " consistent with alpha thal trait  Plan: - Discussed lab results from 03/30/22 with patient.  CMP stable.  CBC showed WBC 11.8 K. Hemoglobin 13.1. Platelets 299 K. Labs shows that IV Iron has helped improve her hgb levels.  -Ferritin improved at 58 with an iron saturation of 20%. -No clear indication for additional IV iron at this time. -Patient was recommended to eat iron rich foods -educated the patient that heavy menstrual cycle could decrease her iron levels again if not addressed.  -Advised Pt to continue following up with her OBGYN for heavy menstrual periods and discuss options.   Follow up: RTC with Dr Irene Limbo with labs in 6 months  The total time spent in the appointment was 20 minutes* .  All of the patient's questions were answered with apparent satisfaction. The patient knows to call the clinic with any problems, questions or concerns.   Sullivan Lone MD MS AAHIVMS [facility] Hematology/Oncology Physician [facility]  .*Total Encounter Time as defined by the Centers for Medicare and Medicaid Services includes, in addition to the face-to-face time of a patient visit (documented in the note above) non-face-to-face time: obtaining and reviewing outside history, ordering and reviewing medications, tests or procedures, care coordination (communications with other health care professionals or caregivers) and documentation in  the medical record.    I,Mitra Faeizi,acting as a Education administrator for Sullivan Lone, MD.,have documented all relevant documentation on the behalf of Sullivan Lone, MD,as directed by  Sullivan Lone, MD while in the presence of Sullivan Lone, MD.   .I have reviewed the above documentation for accuracy and completeness, and I agree with the above. Brunetta Genera MD

## 2022-03-31 ENCOUNTER — Ambulatory Visit (INDEPENDENT_AMBULATORY_CARE_PROVIDER_SITE_OTHER): Payer: BC Managed Care – PPO | Admitting: Family Medicine

## 2022-03-31 ENCOUNTER — Encounter (INDEPENDENT_AMBULATORY_CARE_PROVIDER_SITE_OTHER): Payer: Self-pay | Admitting: Family Medicine

## 2022-03-31 VITALS — BP 152/60 | HR 72 | Temp 98.2°F | Ht 66.0 in | Wt 259.0 lb

## 2022-03-31 DIAGNOSIS — E7849 Other hyperlipidemia: Secondary | ICD-10-CM

## 2022-03-31 DIAGNOSIS — E559 Vitamin D deficiency, unspecified: Secondary | ICD-10-CM

## 2022-03-31 DIAGNOSIS — F419 Anxiety disorder, unspecified: Secondary | ICD-10-CM

## 2022-03-31 DIAGNOSIS — E88819 Insulin resistance, unspecified: Secondary | ICD-10-CM

## 2022-03-31 DIAGNOSIS — E669 Obesity, unspecified: Secondary | ICD-10-CM

## 2022-03-31 DIAGNOSIS — D508 Other iron deficiency anemias: Secondary | ICD-10-CM

## 2022-03-31 DIAGNOSIS — Z6841 Body Mass Index (BMI) 40.0 and over, adult: Secondary | ICD-10-CM

## 2022-04-01 LAB — LIPID PANEL WITH LDL/HDL RATIO
Cholesterol, Total: 178 mg/dL (ref 100–199)
HDL: 44 mg/dL (ref >39)
LDL Chol Calc (NIH): 118 mg/dL — ABNORMAL HIGH (ref 0–99)
LDL/HDL Ratio: 2.7 ratio (ref 0.0–3.2)
Triglycerides: 85 mg/dL (ref 0–149)
VLDL Cholesterol Cal: 16 mg/dL (ref 5–40)

## 2022-04-01 LAB — COMPREHENSIVE METABOLIC PANEL
ALT: 9 IU/L (ref 0–32)
AST: 13 IU/L (ref 0–40)
Albumin/Globulin Ratio: 1.1 — ABNORMAL LOW (ref 1.2–2.2)
Albumin: 4 g/dL (ref 3.9–4.9)
Alkaline Phosphatase: 89 IU/L (ref 44–121)
BUN/Creatinine Ratio: 15 (ref 9–23)
BUN: 7 mg/dL (ref 6–24)
Bilirubin Total: 1 mg/dL (ref 0.0–1.2)
CO2: 23 mmol/L (ref 20–29)
Calcium: 9.1 mg/dL (ref 8.7–10.2)
Chloride: 99 mmol/L (ref 96–106)
Creatinine, Ser: 0.47 mg/dL — ABNORMAL LOW (ref 0.57–1.00)
Globulin, Total: 3.7 g/dL (ref 1.5–4.5)
Glucose: 69 mg/dL — ABNORMAL LOW (ref 70–99)
Potassium: 3.6 mmol/L (ref 3.5–5.2)
Sodium: 136 mmol/L (ref 134–144)
Total Protein: 7.7 g/dL (ref 6.0–8.5)
eGFR: 120 mL/min/{1.73_m2} (ref >59)

## 2022-04-01 LAB — INSULIN, RANDOM: INSULIN: 8 u[IU]/mL (ref 2.6–24.9)

## 2022-04-01 LAB — HEMOGLOBIN A1C
Est. average glucose Bld gHb Est-mCnc: 114 mg/dL
Hgb A1c MFr Bld: 5.6 % (ref 4.8–5.6)

## 2022-04-01 LAB — VITAMIN D 25 HYDROXY (VIT D DEFICIENCY, FRACTURES): Vit D, 25-Hydroxy: 22.5 ng/mL — ABNORMAL LOW (ref 30.0–100.0)

## 2022-04-04 ENCOUNTER — Encounter: Payer: Self-pay | Admitting: Hematology

## 2022-04-07 DIAGNOSIS — Z4509 Encounter for adjustment and management of other cardiac device: Secondary | ICD-10-CM | POA: Diagnosis not present

## 2022-04-07 DIAGNOSIS — R55 Syncope and collapse: Secondary | ICD-10-CM | POA: Diagnosis not present

## 2022-04-07 DIAGNOSIS — R002 Palpitations: Secondary | ICD-10-CM | POA: Diagnosis not present

## 2022-04-11 NOTE — Progress Notes (Signed)
Chief Complaint:   OBESITY Jacqueline Griffin is here to discuss her progress with her obesity treatment plan along with follow-up of her obesity related diagnoses. Jacqueline Griffin is on practicing portion control and making smarter food choices, such as increasing vegetables and decreasing simple carbohydrates and states she is following her eating plan approximately 40% of the time. Jacqueline Griffin states she is exercising 0 minutes 0 times per week.  Today's visit was #: 20 Starting weight: 236 lbs Starting date: 10/06/2020 Today's weight: 259 lbs Today's date: 03/31/2022 Total lbs lost to date: 0 lbs Total lbs lost since last in-office visit: 0  Interim History: Jacqueline Griffin had a good holiday-local.  Went into cardiology and hematology recently.  Recent increase in feelings of anxiety.  Was given prescription of  Flecainide.  Ready to commit.   Subjective:   1. Other iron deficiency anemia CBC and H/H within normal limits.  2. Insulin resistance Not on any medication.  3. Vitamin D deficiency Jacqueline Griffin is currently taking prescription Vit D 50,000 IU once a week.   Denies any nausea, vomiting or muscle weakness.  4. Other hyperlipidemia Last LDL still elevated.  Not on medication.  5. Anxiety Jacqueline Griffin had a prescription for sertraline but has not started it.  Increase in symptoms and anxiety.  Denies suicidal ideas, and homicidal ideas.  Assessment/Plan:   1. Other iron deficiency anemia Will follow-up with hematology.  2. Insulin resistance We will obtain labs today.  - Comprehensive metabolic panel - Hemoglobin A1c - Insulin, random  3. Vitamin D deficiency We will obtain labs today.  - VITAMIN D 25 Hydroxy (Vit-D Deficiency, Fractures)  4. Other hyperlipidemia We will obtain labs today.  - Lipid Panel With LDL/HDL Ratio  5. Anxiety Start Sertraline 25 mg; (She has the prescription).  6. Obesity with current BMI of 41.8 Jacqueline Griffin is currently in the action stage of change. As such,  her goal is to continue with weight loss efforts. She has agreed to keeping a food journal and adhering to recommended goals of 1650-1850 calories and 130+ grams of protein daily.   Exercise goals: No exercise has been prescribed at this time.  Behavioral modification strategies: increasing lean protein intake, meal planning and cooking strategies, keeping healthy foods in the home, and planning for success.  Jacqueline Griffin has agreed to follow-up with our clinic in 4 weeks. She was informed of the importance of frequent follow-up visits to maximize her success with intensive lifestyle modifications for her multiple health conditions.   Jacqueline Griffin was informed we would discuss her lab results at her next visit unless there is a critical issue that needs to be addressed sooner. Jacqueline Griffin agreed to keep her next visit at the agreed upon time to discuss these results.  Objective:   Blood pressure (!) 152/60, pulse 72, temperature 98.2 F (36.8 C), height '5\' 6"'$  (1.676 m), weight 259 lb (117.5 kg), SpO2 98 %. Body mass index is 41.8 kg/m.  General: Cooperative, alert, well developed, in no acute distress. HEENT: Conjunctivae and lids unremarkable. Cardiovascular: Regular rhythm.  Lungs: Normal work of breathing. Neurologic: No focal deficits.   Lab Results  Component Value Date   CREATININE 0.47 (L) 03/31/2022   BUN 7 03/31/2022   NA 136 03/31/2022   K 3.6 03/31/2022   CL 99 03/31/2022   CO2 23 03/31/2022   Lab Results  Component Value Date   ALT 9 03/31/2022   AST 13 03/31/2022   ALKPHOS 89 03/31/2022   BILITOT 1.0 03/31/2022  Lab Results  Component Value Date   HGBA1C 5.6 03/31/2022   HGBA1C 5.5 05/06/2021   HGBA1C 5.5 10/06/2020   Lab Results  Component Value Date   INSULIN 8.0 03/31/2022   INSULIN 13.5 05/06/2021   INSULIN 22.0 10/06/2020   Lab Results  Component Value Date   TSH 1.490 10/06/2020   Lab Results  Component Value Date   CHOL 178 03/31/2022   HDL 44  03/31/2022   LDLCALC 118 (H) 03/31/2022   TRIG 85 03/31/2022   CHOLHDL 3.8 05/06/2021   Lab Results  Component Value Date   VD25OH 22.5 (L) 03/31/2022   VD25OH 29.3 (L) 05/06/2021   VD25OH 16.0 (L) 10/06/2020   Lab Results  Component Value Date   WBC 11.8 (H) 03/30/2022   HGB 13.1 03/30/2022   HCT 39.2 03/30/2022   MCV 80.0 03/30/2022   PLT 299 03/30/2022   Lab Results  Component Value Date   IRON 66 03/30/2022   TIBC 325 03/30/2022   FERRITIN 58 03/30/2022   Attestation Statements:   Reviewed by clinician on day of visit: allergies, medications, problem list, medical history, surgical history, family history, social history, and previous encounter notes.  I, Elnora Morrison, RMA am acting as transcriptionist for Coralie Common, MD.  I have reviewed the above documentation for accuracy and completeness, and I agree with the above. - Coralie Common, MD

## 2022-04-13 DIAGNOSIS — G4733 Obstructive sleep apnea (adult) (pediatric): Secondary | ICD-10-CM | POA: Diagnosis not present

## 2022-04-25 DIAGNOSIS — E669 Obesity, unspecified: Secondary | ICD-10-CM | POA: Diagnosis not present

## 2022-04-25 DIAGNOSIS — I10 Essential (primary) hypertension: Secondary | ICD-10-CM | POA: Diagnosis not present

## 2022-04-25 DIAGNOSIS — Z4509 Encounter for adjustment and management of other cardiac device: Secondary | ICD-10-CM | POA: Diagnosis not present

## 2022-04-25 DIAGNOSIS — F411 Generalized anxiety disorder: Secondary | ICD-10-CM | POA: Diagnosis not present

## 2022-04-25 DIAGNOSIS — I4719 Other supraventricular tachycardia: Secondary | ICD-10-CM | POA: Diagnosis not present

## 2022-04-25 DIAGNOSIS — R002 Palpitations: Secondary | ICD-10-CM | POA: Diagnosis not present

## 2022-04-25 DIAGNOSIS — Z6841 Body Mass Index (BMI) 40.0 and over, adult: Secondary | ICD-10-CM | POA: Diagnosis not present

## 2022-04-25 DIAGNOSIS — Z79899 Other long term (current) drug therapy: Secondary | ICD-10-CM | POA: Diagnosis not present

## 2022-04-25 DIAGNOSIS — R55 Syncope and collapse: Secondary | ICD-10-CM | POA: Diagnosis not present

## 2022-04-25 DIAGNOSIS — Z0001 Encounter for general adult medical examination with abnormal findings: Secondary | ICD-10-CM | POA: Diagnosis not present

## 2022-04-25 DIAGNOSIS — D509 Iron deficiency anemia, unspecified: Secondary | ICD-10-CM | POA: Diagnosis not present

## 2022-04-25 DIAGNOSIS — E559 Vitamin D deficiency, unspecified: Secondary | ICD-10-CM | POA: Diagnosis not present

## 2022-04-28 ENCOUNTER — Ambulatory Visit (INDEPENDENT_AMBULATORY_CARE_PROVIDER_SITE_OTHER): Payer: BC Managed Care – PPO | Admitting: Family Medicine

## 2022-04-28 ENCOUNTER — Encounter (INDEPENDENT_AMBULATORY_CARE_PROVIDER_SITE_OTHER): Payer: Self-pay | Admitting: Family Medicine

## 2022-04-28 VITALS — BP 150/78 | HR 71 | Temp 97.9°F | Ht 66.0 in | Wt 254.0 lb

## 2022-04-28 DIAGNOSIS — I10 Essential (primary) hypertension: Secondary | ICD-10-CM

## 2022-04-28 DIAGNOSIS — Z6841 Body Mass Index (BMI) 40.0 and over, adult: Secondary | ICD-10-CM

## 2022-04-28 DIAGNOSIS — E559 Vitamin D deficiency, unspecified: Secondary | ICD-10-CM

## 2022-04-28 DIAGNOSIS — E669 Obesity, unspecified: Secondary | ICD-10-CM

## 2022-04-28 MED ORDER — VITAMIN D (ERGOCALCIFEROL) 1.25 MG (50000 UNIT) PO CAPS: 50000.0000 [IU] | ORAL_CAPSULE | ORAL | 0 refills | Status: DC

## 2022-04-28 NOTE — Progress Notes (Deleted)
Patient notices challenges on the weekends.  She is journaling between last appointment and today and found that prepping food on Friday before the weekend has made it easier to stick to meal plan.  She feels good with the preparation that she is doing on Friday to ensure she gets the nutrition in.  She was given a Rx for blood pressure control.  She is very concerned about blood pressure medication as well as zoloft in terms about possible medication side effects.  She has been consistently journaling and is much more focused in the last few weeks.  Average calorie intake is around 1100 but it does appear on her journaling that she may not always journal all the food and her protein is at least 65g daily for the calories she is logging. She is still occasionally skipping a meal.  She did indulge on her birthday by eating at [employer].

## 2022-05-04 DIAGNOSIS — Z4509 Encounter for adjustment and management of other cardiac device: Secondary | ICD-10-CM | POA: Diagnosis not present

## 2022-05-10 DIAGNOSIS — N898 Other specified noninflammatory disorders of vagina: Secondary | ICD-10-CM | POA: Diagnosis not present

## 2022-05-10 DIAGNOSIS — Z1330 Encounter for screening examination for mental health and behavioral disorders, unspecified: Secondary | ICD-10-CM | POA: Diagnosis not present

## 2022-05-10 NOTE — Progress Notes (Signed)
Chief Complaint:   OBESITY Jacqueline Griffin is here to discuss her progress with her obesity treatment plan along with follow-up of her obesity related diagnoses. Jacqueline Griffin is on keeping a food journal and adhering to recommended goals of 1650-1850 calories and 130+grams of protein and states she is following her eating plan approximately 95% of the time. Jacqueline Griffin states she is 9,000+steps 7 times per week.  Today's visit was #: 21 Starting weight: 236 lbs Starting date: 10/06/2020 Today's weight: 254 lbs Today's date: 04/28/2022 Total lbs lost to date: 0 lbs Total lbs lost since last in-office visit: 5  Interim History:  Patient notices challenges on the weekends.  She is journaling between last appointment and today and found that prepping food on Friday before the weekend has made it easier to stick to meal plan.  She feels good with the preparation that she is doing on Friday to ensure she gets the nutrition in.  She was given a Rx for blood pressure control.  She is very concerned about blood pressure medication as well as zoloft in terms about possible medication side effects.  She has been consistently journaling and is much more focused in the last few weeks.  Average calorie intake is around 1100 but it does appear on her journaling that she may not always journal all the food and her protein is at least 65g daily for the calories she is logging. She is still occasionally skipping a meal.  She did indulge on her birthday by eating at [employer].      Subjective:   1. Essential hypertension Blood elevated today.  Jacqueline Griffin has Losartan and is having a headache.  2. Vitamin D deficiency Jacqueline Griffin is currently taking prescription Vit D 50,000 IU once a week.  She notes fatigue.  Assessment/Plan:   1. Essential hypertension Patient is start taking Losartan today.  2. Vitamin D deficiency We will refill Vit D 50K IU once a week for 1 month with 0 refills.  -Refill Vitamin D, Ergocalciferol,  (DRISDOL) 1.25 MG (50000 UNIT) CAPS capsule; Take 1 capsule (50,000 Units total) by mouth every 7 (seven) days.  Dispense: 4 capsule; Refill: 0  3. BMI 40.0-44.9, adult ([place])  4. Obesity with current BMI of 41.1 Jacqueline Griffin is currently in the action stage of change. As such, her goal is to continue with weight loss efforts. She has agreed to keeping a food journal and adhering to recommended goals of 1650-1850 calories and 130+ grams of protein daily.   Exercise goals: All adults should avoid inactivity. Some physical activity is better than none, and adults who participate in any amount of physical activity gain some health benefits.  Behavioral modification strategies: increasing lean protein intake, meal planning and cooking strategies, keeping healthy foods in the home, and planning for success.  Jacqueline Griffin has agreed to follow-up with our clinic in 3 weeks. She was informed of the importance of frequent follow-up visits to maximize her success with intensive lifestyle modifications for her multiple health conditions.   Objective:   Blood pressure (!) 150/78, pulse 71, temperature 97.9 F (36.6 C), height '5\' 6"'$  (1.676 m), weight 254 lb (115.2 kg), SpO2 99 %. Body mass index is 41 kg/m.  General: Cooperative, alert, well developed, in no acute distress. HEENT: Conjunctivae and lids unremarkable. Cardiovascular: Regular rhythm.  Lungs: Normal work of breathing. Neurologic: No focal deficits.   Lab Results  Component Value Date   CREATININE 0.47 (L) 03/31/2022   BUN 7 03/31/2022  NA 136 03/31/2022   K 3.6 03/31/2022   CL 99 03/31/2022   CO2 23 03/31/2022   Lab Results  Component Value Date   ALT 9 03/31/2022   AST 13 03/31/2022   ALKPHOS 89 03/31/2022   BILITOT 1.0 03/31/2022   Lab Results  Component Value Date   HGBA1C 5.6 03/31/2022   HGBA1C 5.5 05/06/2021   HGBA1C 5.5 10/06/2020   Lab Results  Component Value Date   INSULIN 8.0 03/31/2022   INSULIN 13.5 05/06/2021    INSULIN 22.0 10/06/2020   Lab Results  Component Value Date   TSH 1.490 10/06/2020   Lab Results  Component Value Date   CHOL 178 03/31/2022   HDL 44 03/31/2022   LDLCALC 118 (H) 03/31/2022   TRIG 85 03/31/2022   CHOLHDL 3.8 05/06/2021   Lab Results  Component Value Date   VD25OH 22.5 (L) 03/31/2022   VD25OH 29.3 (L) 05/06/2021   VD25OH 16.0 (L) 10/06/2020   Lab Results  Component Value Date   WBC 11.8 (H) 03/30/2022   HGB 13.1 03/30/2022   HCT 39.2 03/30/2022   MCV 80.0 03/30/2022   PLT 299 03/30/2022   Lab Results  Component Value Date   IRON 66 03/30/2022   TIBC 325 03/30/2022   FERRITIN 58 03/30/2022   Attestation Statements:   Reviewed by clinician on day of visit: allergies, medications, problem list, medical history, surgical history, family history, social history, and previous encounter notes.  I, Elnora Morrison, RMA am acting as transcriptionist for Coralie Common, MD.  I have reviewed the above documentation for accuracy and completeness, and I agree with the above. - Coralie Common, MD

## 2022-05-14 DIAGNOSIS — G4733 Obstructive sleep apnea (adult) (pediatric): Secondary | ICD-10-CM | POA: Diagnosis not present

## 2022-05-23 ENCOUNTER — Ambulatory Visit (INDEPENDENT_AMBULATORY_CARE_PROVIDER_SITE_OTHER): Payer: BC Managed Care – PPO | Admitting: Family Medicine

## 2022-05-23 ENCOUNTER — Encounter (INDEPENDENT_AMBULATORY_CARE_PROVIDER_SITE_OTHER): Payer: Self-pay | Admitting: Family Medicine

## 2022-05-23 VITALS — BP 129/76 | HR 79 | Temp 97.8°F | Ht 66.0 in | Wt 253.0 lb

## 2022-05-23 DIAGNOSIS — I10 Essential (primary) hypertension: Secondary | ICD-10-CM | POA: Diagnosis not present

## 2022-05-23 DIAGNOSIS — F411 Generalized anxiety disorder: Secondary | ICD-10-CM | POA: Diagnosis not present

## 2022-05-23 DIAGNOSIS — Z6841 Body Mass Index (BMI) 40.0 and over, adult: Secondary | ICD-10-CM | POA: Diagnosis not present

## 2022-05-23 DIAGNOSIS — E559 Vitamin D deficiency, unspecified: Secondary | ICD-10-CM | POA: Diagnosis not present

## 2022-05-23 DIAGNOSIS — E669 Obesity, unspecified: Secondary | ICD-10-CM

## 2022-05-23 DIAGNOSIS — I4719 Other supraventricular tachycardia: Secondary | ICD-10-CM | POA: Diagnosis not present

## 2022-05-23 DIAGNOSIS — E7849 Other hyperlipidemia: Secondary | ICD-10-CM | POA: Diagnosis not present

## 2022-05-23 MED ORDER — VITAMIN D (ERGOCALCIFEROL) 1.25 MG (50000 UNIT) PO CAPS: 50000.0000 [IU] | ORAL_CAPSULE | ORAL | 0 refills | Status: DC

## 2022-05-23 NOTE — Progress Notes (Deleted)
Patient has been trying to journal foods she has been eating.  Trying to stay consistent with logging food. Average calories on week view around 1250/day.  Estimated average protein intake was around 92g/day.  Still somewhat calorie deficient.  She is feeling full most of the time.  She has incorporated protein waffles or protein oatmeal daily.  Aiming to get 30-40g of protein with each meal she is eating. Patient has started incorporating some easy cycling and going to the gym.  Recognizes she needs to get different head phones. She has appointment to get BP checked today and possibly get kidney function checked.

## 2022-05-27 DIAGNOSIS — R0981 Nasal congestion: Secondary | ICD-10-CM | POA: Diagnosis not present

## 2022-05-27 DIAGNOSIS — G4733 Obstructive sleep apnea (adult) (pediatric): Secondary | ICD-10-CM | POA: Diagnosis not present

## 2022-05-30 DIAGNOSIS — R55 Syncope and collapse: Secondary | ICD-10-CM | POA: Diagnosis not present

## 2022-05-30 DIAGNOSIS — Z4509 Encounter for adjustment and management of other cardiac device: Secondary | ICD-10-CM | POA: Diagnosis not present

## 2022-06-06 NOTE — Progress Notes (Unsigned)
Chief Complaint:   OBESITY Jacqueline Griffin is here to discuss her progress with her obesity treatment plan along with follow-up of her obesity related diagnoses. Jacqueline Griffin is on keeping a food journal and adhering to recommended goals of 1650-1850 calories and 130+ protein and states she is following her eating plan approximately 90% of the time. Jacqueline Griffin states she is walking 10,000 steps, gym 30 minutes 1-7 times per week.  Today's visit was #: 22 Starting weight: 236 lbs Starting date: 10/06/2020 Today's weight: 253 lbs Today's date: 05/23/2022 Total lbs lost to date: 0 Total lbs lost since last in-office visit: 1 lb  Interim History:  Patient has been trying to journal foods she has been eating.  Trying to stay consistent with logging food. Average calories on week view around 1250/day.  Estimated average protein intake was around 92g/day.  Still somewhat calorie deficient.  She is feeling full most of the time.  She has incorporated protein waffles or protein oatmeal daily.  Aiming to get 30-40g of protein with each meal she is eating. Patient has started incorporating some easy cycling and going to the gym.  Recognizes she needs to get different head phones. She has appointment to get BP checked today and possibly get kidney function checked.   Subjective:   1. Vitamin D deficiency Patient is on prescription vitamin D.  Patient last vitamin D level below goal at 22.5.  2. Other hyperlipidemia Patient last LDL slightly elevated at 118.  Patient is not on any medications.  Assessment/Plan:   1. Vitamin D deficiency Refill- Vitamin D, Ergocalciferol, (DRISDOL) 1.25 MG (50000 UNIT) CAPS capsule; Take 1 capsule (50,000 Units total) by mouth every 7 (seven) days.  Dispense: 4 capsule; Refill: 0  2. Other hyperlipidemia Repeat labs in 2 months.  3. BMI 40.0-44.9, adult (Jacqueline Griffin)  4. Obesity with startting BMI of 42.4 Jacqueline Griffin is currently in the action stage of change. As such, her goal is  to continue with weight loss efforts. She has agreed to keeping a food journal and adhering to recommended goals of 1650-1850 calories and 130+ protein daily.  Exercise goals: All adults should avoid inactivity. Some physical activity is better than none, and adults who participate in any amount of physical activity gain some health benefits.  Behavioral modification strategies: increasing lean protein intake, meal planning and cooking strategies, keeping healthy foods in the home, and planning for success.  Jacqueline Griffin has agreed to follow-up with our clinic in 3 weeks. She was informed of the importance of frequent follow-up visits to maximize her success with intensive lifestyle modifications for her multiple health conditions.   Objective:   Blood pressure 129/76, pulse 79, temperature 97.8 F (36.6 C), height '5\' 6"'$  (1.676 m), weight 253 lb (114.8 kg), SpO2 98 %. Body mass index is 40.84 kg/m.  General: Cooperative, alert, well developed, in no acute distress. HEENT: Conjunctivae and lids unremarkable. Cardiovascular: Regular rhythm.  Lungs: Normal work of breathing. Neurologic: No focal deficits.   Lab Results  Component Value Date   CREATININE 0.47 (L) 03/31/2022   BUN 7 03/31/2022   NA 136 03/31/2022   K 3.6 03/31/2022   CL 99 03/31/2022   CO2 23 03/31/2022   Lab Results  Component Value Date   ALT 9 03/31/2022   AST 13 03/31/2022   ALKPHOS 89 03/31/2022   BILITOT 1.0 03/31/2022   Lab Results  Component Value Date   HGBA1C 5.6 03/31/2022   HGBA1C 5.5 05/06/2021   HGBA1C 5.5  10/06/2020   Lab Results  Component Value Date   INSULIN 8.0 03/31/2022   INSULIN 13.5 05/06/2021   INSULIN 22.0 10/06/2020   Lab Results  Component Value Date   TSH 1.490 10/06/2020   Lab Results  Component Value Date   CHOL 178 03/31/2022   HDL 44 03/31/2022   LDLCALC 118 (H) 03/31/2022   TRIG 85 03/31/2022   CHOLHDL 3.8 05/06/2021   Lab Results  Component Value Date   VD25OH  22.5 (L) 03/31/2022   VD25OH 29.3 (L) 05/06/2021   VD25OH 16.0 (L) 10/06/2020   Lab Results  Component Value Date   WBC 11.8 (H) 03/30/2022   HGB 13.1 03/30/2022   HCT 39.2 03/30/2022   MCV 80.0 03/30/2022   PLT 299 03/30/2022   Lab Results  Component Value Date   IRON 66 03/30/2022   TIBC 325 03/30/2022   FERRITIN 58 03/30/2022   Attestation Statements:   Reviewed by clinician on day of visit: allergies, medications, problem list, medical history, surgical history, family history, social history, and previous encounter notes.  I, Davy Pique, RMA, am acting as transcriptionist for Coralie Common, MD.  I have reviewed the above documentation for accuracy and completeness, and I agree with the above. - Coralie Common, MD

## 2022-06-12 DIAGNOSIS — G4733 Obstructive sleep apnea (adult) (pediatric): Secondary | ICD-10-CM | POA: Diagnosis not present

## 2022-06-20 ENCOUNTER — Ambulatory Visit (INDEPENDENT_AMBULATORY_CARE_PROVIDER_SITE_OTHER): Payer: BC Managed Care – PPO | Admitting: Family Medicine

## 2022-06-20 ENCOUNTER — Encounter (INDEPENDENT_AMBULATORY_CARE_PROVIDER_SITE_OTHER): Payer: Self-pay | Admitting: Family Medicine

## 2022-06-20 VITALS — BP 157/73 | HR 66 | Temp 97.9°F | Ht 66.0 in | Wt 252.0 lb

## 2022-06-20 DIAGNOSIS — I10 Essential (primary) hypertension: Secondary | ICD-10-CM | POA: Diagnosis not present

## 2022-06-20 DIAGNOSIS — E559 Vitamin D deficiency, unspecified: Secondary | ICD-10-CM

## 2022-06-20 DIAGNOSIS — E669 Obesity, unspecified: Secondary | ICD-10-CM

## 2022-06-20 DIAGNOSIS — Z6841 Body Mass Index (BMI) 40.0 and over, adult: Secondary | ICD-10-CM | POA: Diagnosis not present

## 2022-06-20 MED ORDER — VITAMIN D (ERGOCALCIFEROL) 1.25 MG (50000 UNIT) PO CAPS: 50000.0000 [IU] | ORAL_CAPSULE | ORAL | 0 refills | Status: DC

## 2022-06-20 NOTE — Progress Notes (Signed)
Chief Complaint:   OBESITY Jacqueline Griffin is here to discuss her progress with her obesity treatment plan along with follow-up of her obesity related diagnoses. Jacqueline Griffin is on keeping a food journal and adhering to recommended goals of 1650-1850 calories and 130+ protein and states she is following her eating plan approximately 70% of the time. Jacqueline Griffin states she is walking 9,000-10,000 steps 7 times per week.  Today's visit was #: 23 Starting weight: 263 lbs Starting date: 10/06/2020 Today's weight: 252 lbs Today's date: 06/20/2022 Total lbs lost to date: 11 lbs Total lbs lost since last in-office visit: 1 lb  Interim History: Patient feels like she hasn't done as well this past few weeks.  She hasn't been journaling as much as she did previously.  Her father got hospitalized recently unexpectedly.  Food wise she hasn't done much different but hasn't been as aware of food and journaling. She is making changes to the food choices she is incorporating.  She is restarting therapy this week and is going to starting to look for new jobs.  Subjective:   1. Essential hypertension Patients blood pressure is elevated today.  Patient denies chest pain, chest pressure, or headache. Patient is taking metoprolol daily.   2. Vitamin D deficiency Patient is on prescription Vitamin D.  Patient is positive for fatigue.    Assessment/Plan:   1. Essential hypertension Continue Toprol, no change in medications or dose.   2. Vitamin D deficiency Refill - Vitamin D, Ergocalciferol, (DRISDOL) 1.25 MG (50000 UNIT) CAPS capsule; Take 1 capsule (50,000 Units total) by mouth every 7 (seven) days.  Dispense: 4 capsule; Refill: 0  3. BMI 40.0-44.9, adult ([place])  4. Obesity with startting BMI of 42.4 Mende is currently in the action stage of change. As such, her goal is to continue with weight loss efforts. She has agreed to keeping a food journal and adhering to recommended goals of 1650-1850 calories and  130+ protein daily.   Exercise goals: All adults should avoid inactivity. Some physical activity is better than none, and adults who participate in any amount of physical activity gain some health benefits.  Behavioral modification strategies: increasing lean protein intake, meal planning and cooking strategies, keeping healthy foods in the home, and planning for success.  Jacqueline Griffin has agreed to follow-up with our clinic in 4 weeks. She was informed of the importance of frequent follow-up visits to maximize her success with intensive lifestyle modifications for her multiple health conditions.   Objective:   Blood pressure (!) 157/73, pulse 66, temperature 97.9 F (36.6 C), height 5\' 6"  (1.676 m), weight 252 lb (114.3 kg), SpO2 99 %. Body mass index is 40.67 kg/m.  General: Cooperative, alert, well developed, in no acute distress. HEENT: Conjunctivae and lids unremarkable. Cardiovascular: Regular rhythm.  Lungs: Normal work of breathing. Neurologic: No focal deficits.   Lab Results  Component Value Date   CREATININE 0.47 (L) 03/31/2022   BUN 7 03/31/2022   NA 136 03/31/2022   K 3.6 03/31/2022   CL 99 03/31/2022   CO2 23 03/31/2022   Lab Results  Component Value Date   ALT 9 03/31/2022   AST 13 03/31/2022   ALKPHOS 89 03/31/2022   BILITOT 1.0 03/31/2022   Lab Results  Component Value Date   HGBA1C 5.6 03/31/2022   HGBA1C 5.5 05/06/2021   HGBA1C 5.5 10/06/2020   Lab Results  Component Value Date   INSULIN 8.0 03/31/2022   INSULIN 13.5 05/06/2021   INSULIN 22.0  10/06/2020   Lab Results  Component Value Date   TSH 1.490 10/06/2020   Lab Results  Component Value Date   CHOL 178 03/31/2022   HDL 44 03/31/2022   LDLCALC 118 (H) 03/31/2022   TRIG 85 03/31/2022   CHOLHDL 3.8 05/06/2021   Lab Results  Component Value Date   VD25OH 22.5 (L) 03/31/2022   VD25OH 29.3 (L) 05/06/2021   VD25OH 16.0 (L) 10/06/2020   Lab Results  Component Value Date   WBC 11.8 (H)  03/30/2022   HGB 13.1 03/30/2022   HCT 39.2 03/30/2022   MCV 80.0 03/30/2022   PLT 299 03/30/2022   Lab Results  Component Value Date   IRON 66 03/30/2022   TIBC 325 03/30/2022   FERRITIN 58 03/30/2022   Attestation Statements:   Reviewed by clinician on day of visit: allergies, medications, problem list, medical history, surgical history, family history, social history, and previous encounter notes.  I, Davy Pique, RMA, am acting as transcriptionist for Coralie Common, MD. I have reviewed the above documentation for accuracy and completeness, and I agree with the above. - Coralie Common, MD

## 2022-06-23 DIAGNOSIS — F4389 Other reactions to severe stress: Secondary | ICD-10-CM | POA: Diagnosis not present

## 2022-06-30 DIAGNOSIS — R55 Syncope and collapse: Secondary | ICD-10-CM | POA: Diagnosis not present

## 2022-06-30 DIAGNOSIS — Z4509 Encounter for adjustment and management of other cardiac device: Secondary | ICD-10-CM | POA: Diagnosis not present

## 2022-07-04 ENCOUNTER — Ambulatory Visit: Payer: BC Managed Care – PPO | Admitting: Family Medicine

## 2022-07-12 DIAGNOSIS — G4733 Obstructive sleep apnea (adult) (pediatric): Secondary | ICD-10-CM | POA: Diagnosis not present

## 2022-07-13 ENCOUNTER — Ambulatory Visit (INDEPENDENT_AMBULATORY_CARE_PROVIDER_SITE_OTHER): Payer: BC Managed Care – PPO | Admitting: Family Medicine

## 2022-07-13 ENCOUNTER — Encounter (INDEPENDENT_AMBULATORY_CARE_PROVIDER_SITE_OTHER): Payer: Self-pay | Admitting: Family Medicine

## 2022-07-13 VITALS — BP 131/80 | HR 72 | Temp 97.6°F | Ht 66.0 in | Wt 249.0 lb

## 2022-07-13 DIAGNOSIS — E669 Obesity, unspecified: Secondary | ICD-10-CM

## 2022-07-13 DIAGNOSIS — E559 Vitamin D deficiency, unspecified: Secondary | ICD-10-CM | POA: Diagnosis not present

## 2022-07-13 DIAGNOSIS — I10 Essential (primary) hypertension: Secondary | ICD-10-CM

## 2022-07-13 DIAGNOSIS — G4733 Obstructive sleep apnea (adult) (pediatric): Secondary | ICD-10-CM | POA: Diagnosis not present

## 2022-07-13 DIAGNOSIS — F4389 Other reactions to severe stress: Secondary | ICD-10-CM | POA: Diagnosis not present

## 2022-07-13 DIAGNOSIS — Z6841 Body Mass Index (BMI) 40.0 and over, adult: Secondary | ICD-10-CM

## 2022-07-13 MED ORDER — VITAMIN D (ERGOCALCIFEROL) 1.25 MG (50000 UNIT) PO CAPS: 50000.0000 [IU] | ORAL_CAPSULE | ORAL | 0 refills | Status: DC

## 2022-07-13 NOTE — Progress Notes (Signed)
Chief Complaint:   OBESITY Jacqueline Griffin is here to discuss her progress with her obesity treatment plan along with follow-up of her obesity related diagnoses. Jacqueline Griffin is on keeping a food journal and adhering to recommended goals of 1650-1850 calories and 130+ protein and states she is following her eating plan approximately 85% of the time. Jacqueline Griffin states she is 8500-16,000 steps daily 7 times per week.  Today's visit was #: 24 Starting weight: 263 LBS Starting date: 10/06/2020 Today's weight: 249 LBS Today's date: 07/13/2022 Total lbs lost to date: 14 LBS Total lbs lost since last in-office visit: 3 LBS  Interim History: Patient has been trying to stay motivated and consistent on her awareness of food choices and combinations of food to get her calories and protein.  She has been purchasing food with extra protein in and focusing on food's protein level.  Her choices are becoming more habitual.  She has tried to experiment with a few different combinations and trying to figure out what is sustainable.  Patient has her son's fifth grade graduation in June.    Subjective:   1. Essential hypertension Patient's blood pressure is controlled today.  Patient denies chest pain, chest pressure, headache.  2. Vitamin D deficiency Patient is on prescription vitamin D.  Patient denies nausea, vomiting, muscle weakness, but is positive for fatigue.  Patient last vitamin D level 22.5.  Assessment/Plan:   1. Essential hypertension Continue current medications.  Patient to follow-up with cardiology at next scheduled appointment.  2. Vitamin D deficiency Refill- Vitamin D, Ergocalciferol, (DRISDOL) 1.25 MG (50000 UNIT) CAPS capsule; Take 1 capsule (50,000 Units total) by mouth every 7 (seven) days.  Dispense: 4 capsule; Refill: 0  3. BMI 40.0-44.9, adult  4. Obesity with starting BMI of 42.4 Jacqueline Griffin is currently in the action stage of change. As such, her goal is to continue with weight loss  efforts. She has agreed to keeping a food journal and adhering to recommended goals of 1650-1850 calories and 130+ protein daily.  Exercise goals: All adults should avoid inactivity. Some physical activity is better than none, and adults who participate in any amount of physical activity gain some health benefits.  Behavioral modification strategies: increasing lean protein intake, meal planning and cooking strategies, keeping healthy foods in the home, and planning for success.  Jacqueline Griffin has agreed to follow-up with our clinic in 4 weeks. She was informed of the importance of frequent follow-up visits to maximize her success with intensive lifestyle modifications for her multiple health conditions.   Objective:   Blood pressure 131/80, pulse 72, temperature 97.6 F (36.4 C), height  (1.676 m), weight 249 lb (112.9 kg), SpO2 100 %. Body mass index is 40.19 kg/m.  General: Cooperative, alert, well developed, in no acute distress. HEENT: Conjunctivae and lids unremarkable. Cardiovascular: Regular rhythm.  Lungs: Normal work of breathing. Neurologic: No focal deficits.   Lab Results  Component Value Date   CREATININE 0.47 (L) 03/31/2022   BUN 7 03/31/2022   NA 136 03/31/2022   K 3.6 03/31/2022   CL 99 03/31/2022   CO2 23 03/31/2022   Lab Results  Component Value Date   ALT 9 03/31/2022   AST 13 03/31/2022   ALKPHOS 89 03/31/2022   BILITOT 1.0 03/31/2022   Lab Results  Component Value Date   HGBA1C 5.6 03/31/2022   HGBA1C 5.5 05/06/2021   HGBA1C 5.5 10/06/2020   Lab Results  Component Value Date   INSULIN 8.0 03/31/2022  INSULIN 13.5 05/06/2021   INSULIN 22.0 10/06/2020   Lab Results  Component Value Date   TSH 1.490 10/06/2020   Lab Results  Component Value Date   CHOL 178 03/31/2022   HDL 44 03/31/2022   LDLCALC 118 (H) 03/31/2022   TRIG 85 03/31/2022   CHOLHDL 3.8 05/06/2021   Lab Results  Component Value Date   VD25OH 22.5 (L) 03/31/2022   VD25OH  29.3 (L) 05/06/2021   VD25OH 16.0 (L) 10/06/2020   Lab Results  Component Value Date   WBC 11.8 (H) 03/30/2022   HGB 13.1 03/30/2022   HCT 39.2 03/30/2022   MCV 80.0 03/30/2022   PLT 299 03/30/2022   Lab Results  Component Value Date   IRON 66 03/30/2022   TIBC 325 03/30/2022   FERRITIN 58 03/30/2022    Attestation Statements:   Reviewed by clinician on day of visit: allergies, medications, problem list, medical history, surgical history, family history, social history, and previous encounter notes.  I, Malcolm Metro, RMA, am acting as transcriptionist for Reuben Likes, MD.  I have reviewed the above documentation for accuracy and completeness, and I agree with the above. - Reuben Likes, MD

## 2022-07-22 ENCOUNTER — Other Ambulatory Visit (INDEPENDENT_AMBULATORY_CARE_PROVIDER_SITE_OTHER): Payer: Self-pay | Admitting: Family Medicine

## 2022-08-10 ENCOUNTER — Encounter (INDEPENDENT_AMBULATORY_CARE_PROVIDER_SITE_OTHER): Payer: Self-pay | Admitting: Family Medicine

## 2022-08-10 ENCOUNTER — Ambulatory Visit (INDEPENDENT_AMBULATORY_CARE_PROVIDER_SITE_OTHER): Payer: BC Managed Care – PPO | Admitting: Family Medicine

## 2022-08-10 VITALS — BP 147/84 | HR 69 | Temp 98.3°F | Ht 66.0 in | Wt 249.0 lb

## 2022-08-10 DIAGNOSIS — Z6841 Body Mass Index (BMI) 40.0 and over, adult: Secondary | ICD-10-CM

## 2022-08-10 DIAGNOSIS — E559 Vitamin D deficiency, unspecified: Secondary | ICD-10-CM

## 2022-08-10 DIAGNOSIS — I10 Essential (primary) hypertension: Secondary | ICD-10-CM | POA: Diagnosis not present

## 2022-08-10 DIAGNOSIS — E669 Obesity, unspecified: Secondary | ICD-10-CM | POA: Diagnosis not present

## 2022-08-10 MED ORDER — VITAMIN D (ERGOCALCIFEROL) 1.25 MG (50000 UNIT) PO CAPS: 50000.0000 [IU] | ORAL_CAPSULE | ORAL | 0 refills | Status: DC

## 2022-08-10 NOTE — Progress Notes (Signed)
Chief Complaint:   OBESITY Jacqueline Griffin is here to discuss her progress with her obesity treatment plan along with follow-up of her obesity related diagnoses. Jacqueline Griffin is on keeping a food journal and adhering to recommended goals of 1650-1850 calories and 130+ grams of protein and states she is following her eating plan approximately 25% of the time. Jacqueline Griffin states she is walking 9,000-10,000 steps 5 times per week.    Today's visit was #: 25 Starting weight: 263 lbs Starting date: 10/06/2020 Today's weight: 249 lbs Today's date: 08/10/2022 Total lbs lost to date: 14 Total lbs lost since last in-office visit: 0  Interim History: Patient feels the last few weeks she wasn't as compliant as she wanted to be.  She got off the last week leading up to her cycle.  Her cycle has been more inconsistent recently in the last few months.  She did crave more recently in terms of carbs.  She feels surprised she didn't gain.  She wants to lose close to 25lbs to retest for sleep apnea. Only plan for Memorial Day is maybe a cookout.  She is using TikTok to get higher protein recipes.    Subjective:   1. Vitamin D deficiency Jacqueline Griffin is on prescription vitamin D.  She denies nausea, vomiting, or muscle weakness, but she notes fatigue.  2. Essential hypertension Jacqueline Griffin's blood pressure is slightly elevated today (last appointment blood pressure was well-controlled).  She denies chest pain, chest pressure, or headache.  She has losartan and metoprolol.  Assessment/Plan:   1. Vitamin D deficiency Jacqueline Griffin will continue prescription vitamin D, and we will refill for 1 month.  - Vitamin D, Ergocalciferol, (DRISDOL) 1.25 MG (50000 UNIT) CAPS capsule; Take 1 capsule (50,000 Units total) by mouth every 7 (seven) days.  Dispense: 4 capsule; Refill: 0  2. Essential hypertension We will follow-up on Jacqueline Griffin's blood pressure at her next appointment; no change in medications or doses.  3. BMI 40.0-44.9, adult  (HCC)  4. Obesity with starting BMI of 42.4 Jacqueline Griffin is currently in the action stage of change. As such, her goal is to continue with weight loss efforts. She has agreed to keeping a food journal and adhering to recommended goals of 1650-1850 calories and 130+ grams of protein daily.   Exercise goals: All adults should avoid inactivity. Some physical activity is better than none, and adults who participate in any amount of physical activity gain some health benefits.  Behavioral modification strategies: increasing lean protein intake, meal planning and cooking strategies, keeping healthy foods in the home, and planning for success.  Jacqueline Griffin has agreed to follow-up with our clinic in 4 weeks. She was informed of the importance of frequent follow-up visits to maximize her success with intensive lifestyle modifications for her multiple health conditions.   Objective:   Blood pressure (!) 147/84, pulse 69, temperature 98.3 F (36.8 C), height 5\' 6"  (1.676 m), weight 249 lb (112.9 kg), last menstrual period 08/05/2022, SpO2 100 %. Body mass index is 40.19 kg/m.  General: Cooperative, alert, well developed, in no acute distress. HEENT: Conjunctivae and lids unremarkable. Cardiovascular: Regular rhythm.  Lungs: Normal work of breathing. Neurologic: No focal deficits.   Lab Results  Component Value Date   CREATININE 0.47 (L) 03/31/2022   BUN 7 03/31/2022   NA 136 03/31/2022   K 3.6 03/31/2022   CL 99 03/31/2022   CO2 23 03/31/2022   Lab Results  Component Value Date   ALT 9 03/31/2022   AST 13  03/31/2022   ALKPHOS 89 03/31/2022   BILITOT 1.0 03/31/2022   Lab Results  Component Value Date   HGBA1C 5.6 03/31/2022   HGBA1C 5.5 05/06/2021   HGBA1C 5.5 10/06/2020   Lab Results  Component Value Date   INSULIN 8.0 03/31/2022   INSULIN 13.5 05/06/2021   INSULIN 22.0 10/06/2020   Lab Results  Component Value Date   TSH 1.490 10/06/2020   Lab Results  Component Value Date    CHOL 178 03/31/2022   HDL 44 03/31/2022   LDLCALC 118 (H) 03/31/2022   TRIG 85 03/31/2022   CHOLHDL 3.8 05/06/2021   Lab Results  Component Value Date   VD25OH 22.5 (L) 03/31/2022   VD25OH 29.3 (L) 05/06/2021   VD25OH 16.0 (L) 10/06/2020   Lab Results  Component Value Date   WBC 11.8 (H) 03/30/2022   HGB 13.1 03/30/2022   HCT 39.2 03/30/2022   MCV 80.0 03/30/2022   PLT 299 03/30/2022   Lab Results  Component Value Date   IRON 66 03/30/2022   TIBC 325 03/30/2022   FERRITIN 58 03/30/2022   Attestation Statements:   Reviewed by clinician on day of visit: allergies, medications, problem list, medical history, surgical history, family history, social history, and previous encounter notes.   I, Burt Knack, am acting as transcriptionist for Reuben Likes, MD.  I have reviewed the above documentation for accuracy and completeness, and I agree with the above. - Reuben Likes, MD

## 2022-08-11 DIAGNOSIS — G4733 Obstructive sleep apnea (adult) (pediatric): Secondary | ICD-10-CM | POA: Diagnosis not present

## 2022-08-25 DIAGNOSIS — F4389 Other reactions to severe stress: Secondary | ICD-10-CM | POA: Diagnosis not present

## 2022-08-25 DIAGNOSIS — E041 Nontoxic single thyroid nodule: Secondary | ICD-10-CM | POA: Diagnosis not present

## 2022-09-07 ENCOUNTER — Ambulatory Visit (INDEPENDENT_AMBULATORY_CARE_PROVIDER_SITE_OTHER): Payer: BC Managed Care – PPO | Admitting: Family Medicine

## 2022-09-07 ENCOUNTER — Encounter (INDEPENDENT_AMBULATORY_CARE_PROVIDER_SITE_OTHER): Payer: Self-pay | Admitting: Family Medicine

## 2022-09-07 VITALS — BP 148/74 | HR 74 | Temp 98.1°F | Ht 66.0 in | Wt 254.0 lb

## 2022-09-07 DIAGNOSIS — I10 Essential (primary) hypertension: Secondary | ICD-10-CM

## 2022-09-07 DIAGNOSIS — E559 Vitamin D deficiency, unspecified: Secondary | ICD-10-CM | POA: Diagnosis not present

## 2022-09-07 DIAGNOSIS — Z6841 Body Mass Index (BMI) 40.0 and over, adult: Secondary | ICD-10-CM | POA: Diagnosis not present

## 2022-09-07 DIAGNOSIS — E669 Obesity, unspecified: Secondary | ICD-10-CM

## 2022-09-07 MED ORDER — VITAMIN D (ERGOCALCIFEROL) 1.25 MG (50000 UNIT) PO CAPS: 50000.0000 [IU] | ORAL_CAPSULE | ORAL | 0 refills | Status: DC

## 2022-09-07 NOTE — Progress Notes (Signed)
Chief Complaint:   OBESITY Jacqueline Griffin is here to discuss her progress with her obesity treatment plan along with follow-up of her obesity related diagnoses. Jacqueline Griffin is on keeping a food journal and adhering to recommended goals of 1650-1850 calories and 130+ grams of protein and states she is following her eating plan approximately 15% of the time. Jacqueline Griffin states she is walking 8,500 steps.    Today's visit was #: 26 Starting weight: 263 lbs Starting date: 10/06/2020 Today's weight: 254 lbs Today's date: 09/07/2022 Total lbs lost to date: 9 Total lbs lost since last in-office visit: 0  Interim History: Patient feels like she has struggled this past month feeling more hungry and then having her cycle.  She didn't meet calorie goal consistently she realizes.  She realizes she hasn't been as prepared as she was previously and that has led it to be more difficult to be consistently.  Recognizes that supper is the most difficult time for her to make food choices that are in line with her nutritional requirements.  Subjective:   1. Vitamin D deficiency Patient denies nausea, vomiting, or muscle weakness but notes fatigue.  2. Essential hypertension Patient's blood pressure is slightly elevated today.  She is on losartan, and she notes stress.  Assessment/Plan:   1. Vitamin D deficiency Patient will continue prescription vitamin D once weekly, and we will refill for 1 month.  - Vitamin D, Ergocalciferol, (DRISDOL) 1.25 MG (50000 UNIT) CAPS capsule; Take 1 capsule (50,000 Units total) by mouth every 7 (seven) days.  Dispense: 4 capsule; Refill: 0  2. Essential hypertension Patient will continue Cozaar with no change in dose.  3. BMI 40.0-44.9, adult (HCC)  4. Obesity with starting BMI of 42.4 Jacqueline Griffin is currently in the action stage of change. As such, her goal is to continue with weight loss efforts. She has agreed to keeping a food journal and adhering to recommended goals of  1650-1850 calories and 130+ grams of protein daily.   Exercise goals: As is.   Behavioral modification strategies: increasing lean protein intake, meal planning and cooking strategies, keeping healthy foods in the home, and planning for success.  Jacqueline Griffin has agreed to follow-up with our clinic in 4 weeks. She was informed of the importance of frequent follow-up visits to maximize her success with intensive lifestyle modifications for her multiple health conditions.   Objective:   Blood pressure (!) 148/74, pulse 74, temperature 98.1 F (36.7 C), height 5\' 6"  (1.676 m), weight 254 lb (115.2 kg), last menstrual period 08/30/2022, SpO2 99 %. Body mass index is 41 kg/m.  General: Cooperative, alert, well developed, in no acute distress. HEENT: Conjunctivae and lids unremarkable. Cardiovascular: Regular rhythm.  Lungs: Normal work of breathing. Neurologic: No focal deficits.   Lab Results  Component Value Date   CREATININE 0.47 (L) 03/31/2022   BUN 7 03/31/2022   NA 136 03/31/2022   K 3.6 03/31/2022   CL 99 03/31/2022   CO2 23 03/31/2022   Lab Results  Component Value Date   ALT 9 03/31/2022   AST 13 03/31/2022   ALKPHOS 89 03/31/2022   BILITOT 1.0 03/31/2022   Lab Results  Component Value Date   HGBA1C 5.6 03/31/2022   HGBA1C 5.5 05/06/2021   HGBA1C 5.5 10/06/2020   Lab Results  Component Value Date   INSULIN 8.0 03/31/2022   INSULIN 13.5 05/06/2021   INSULIN 22.0 10/06/2020   Lab Results  Component Value Date   TSH 1.490 10/06/2020  Lab Results  Component Value Date   CHOL 178 03/31/2022   HDL 44 03/31/2022   LDLCALC 118 (H) 03/31/2022   TRIG 85 03/31/2022   CHOLHDL 3.8 05/06/2021   Lab Results  Component Value Date   VD25OH 22.5 (L) 03/31/2022   VD25OH 29.3 (L) 05/06/2021   VD25OH 16.0 (L) 10/06/2020   Lab Results  Component Value Date   WBC 11.8 (H) 03/30/2022   HGB 13.1 03/30/2022   HCT 39.2 03/30/2022   MCV 80.0 03/30/2022   PLT 299  03/30/2022   Lab Results  Component Value Date   IRON 66 03/30/2022   TIBC 325 03/30/2022   FERRITIN 58 03/30/2022   Attestation Statements:   Reviewed by clinician on day of visit: allergies, medications, problem list, medical history, surgical history, family history, social history, and previous encounter notes.   I, Burt Knack, am acting as transcriptionist for Reuben Likes, MD.  I have reviewed the above documentation for accuracy and completeness, and I agree with the above. - Reuben Likes, MD

## 2022-09-08 DIAGNOSIS — F4389 Other reactions to severe stress: Secondary | ICD-10-CM | POA: Diagnosis not present

## 2022-09-11 DIAGNOSIS — G4733 Obstructive sleep apnea (adult) (pediatric): Secondary | ICD-10-CM | POA: Diagnosis not present

## 2022-09-16 DIAGNOSIS — I10 Essential (primary) hypertension: Secondary | ICD-10-CM | POA: Diagnosis not present

## 2022-09-16 DIAGNOSIS — F419 Anxiety disorder, unspecified: Secondary | ICD-10-CM | POA: Diagnosis not present

## 2022-09-21 DIAGNOSIS — F4389 Other reactions to severe stress: Secondary | ICD-10-CM | POA: Diagnosis not present

## 2022-09-27 ENCOUNTER — Other Ambulatory Visit: Payer: Self-pay

## 2022-09-27 ENCOUNTER — Other Ambulatory Visit (INDEPENDENT_AMBULATORY_CARE_PROVIDER_SITE_OTHER): Payer: Self-pay | Admitting: Family Medicine

## 2022-09-27 DIAGNOSIS — D563 Thalassemia minor: Secondary | ICD-10-CM

## 2022-09-27 DIAGNOSIS — E559 Vitamin D deficiency, unspecified: Secondary | ICD-10-CM

## 2022-09-28 ENCOUNTER — Other Ambulatory Visit: Payer: Self-pay

## 2022-09-28 ENCOUNTER — Inpatient Hospital Stay: Payer: BC Managed Care – PPO | Attending: Hematology

## 2022-09-28 ENCOUNTER — Inpatient Hospital Stay: Payer: BC Managed Care – PPO | Admitting: Hematology

## 2022-09-28 VITALS — BP 158/80 | HR 70 | Temp 98.1°F | Resp 20 | Wt 257.6 lb

## 2022-09-28 DIAGNOSIS — D751 Secondary polycythemia: Secondary | ICD-10-CM | POA: Diagnosis not present

## 2022-09-28 DIAGNOSIS — D563 Thalassemia minor: Secondary | ICD-10-CM

## 2022-09-28 DIAGNOSIS — D509 Iron deficiency anemia, unspecified: Secondary | ICD-10-CM | POA: Diagnosis not present

## 2022-09-28 DIAGNOSIS — D508 Other iron deficiency anemias: Secondary | ICD-10-CM | POA: Diagnosis not present

## 2022-09-28 LAB — IRON AND IRON BINDING CAPACITY (CC-WL,HP ONLY)
Iron: 29 ug/dL (ref 28–170)
Saturation Ratios: 8 % — ABNORMAL LOW (ref 10.4–31.8)
TIBC: 386 ug/dL (ref 250–450)
UIBC: 357 ug/dL (ref 148–442)

## 2022-09-28 LAB — CMP (CANCER CENTER ONLY)
ALT: 7 U/L (ref 0–44)
AST: 12 U/L — ABNORMAL LOW (ref 15–41)
Albumin: 3.8 g/dL (ref 3.5–5.0)
Alkaline Phosphatase: 80 U/L (ref 38–126)
Anion gap: 5 (ref 5–15)
BUN: 10 mg/dL (ref 6–20)
CO2: 29 mmol/L (ref 22–32)
Calcium: 9.3 mg/dL (ref 8.9–10.3)
Chloride: 104 mmol/L (ref 98–111)
Creatinine: 0.53 mg/dL (ref 0.44–1.00)
GFR, Estimated: >60 mL/min (ref >60)
Glucose, Bld: 82 mg/dL (ref 70–99)
Potassium: 3.6 mmol/L (ref 3.5–5.1)
Sodium: 138 mmol/L (ref 135–145)
Total Bilirubin: 0.5 mg/dL (ref 0.3–1.2)
Total Protein: 7.3 g/dL (ref 6.5–8.1)

## 2022-09-28 LAB — CBC WITH DIFFERENTIAL (CANCER CENTER ONLY)
Abs Immature Granulocytes: 0.02 10*3/uL (ref 0.00–0.07)
Basophils Absolute: 0 10*3/uL (ref 0.0–0.1)
Basophils Relative: 0 %
Eosinophils Absolute: 0.2 10*3/uL (ref 0.0–0.5)
Eosinophils Relative: 2 %
HCT: 38 % (ref 36.0–46.0)
Hemoglobin: 12.2 g/dL (ref 12.0–15.0)
Immature Granulocytes: 0 %
Lymphocytes Relative: 21 %
Lymphs Abs: 2 10*3/uL (ref 0.7–4.0)
MCH: 25.6 pg — ABNORMAL LOW (ref 26.0–34.0)
MCHC: 32.1 g/dL (ref 30.0–36.0)
MCV: 79.8 fL — ABNORMAL LOW (ref 80.0–100.0)
Monocytes Absolute: 0.5 10*3/uL (ref 0.1–1.0)
Monocytes Relative: 6 %
Neutro Abs: 6.9 10*3/uL (ref 1.7–7.7)
Neutrophils Relative %: 71 %
Platelet Count: 338 10*3/uL (ref 150–400)
RBC: 4.76 MIL/uL (ref 3.87–5.11)
RDW: 15.7 % — ABNORMAL HIGH (ref 11.5–15.5)
WBC Count: 9.6 10*3/uL (ref 4.0–10.5)
nRBC: 0 % (ref 0.0–0.2)

## 2022-09-28 LAB — FERRITIN: Ferritin: 11 ng/mL (ref 11–307)

## 2022-09-28 NOTE — Progress Notes (Signed)
HEMATOLOGY/ONCOLOGY CLINIC NOTE  Date of Service: 09/28/2022  Patient Care Team: Pcp, No as PCP - General  CHIEF COMPLAINTS/PURPOSE OF CONSULTATION:  Evaluation and consultation for iron deficiency anemia.  HISTORY OF PRESENTING ILLNESS: Jacqueline Griffin is a wonderful 47 y.o. female who has been referred to [place] by her Dr. Lawson Radar, Criss Alvine, MD for evaluation and consultation for iron deficiency anemia. She reports She is doing well.  She notes heavy periods. She notes that in February of 2023 she was menstruating for almost a month. She reports excessive clotting during menstruation. She notes some fibroids.  We discussed that iron deficiency might be related to heavier menstruation.  She notes that she recently started taking the iron polysaccharide 150 mg 1x p.o daily. She says she does not like them as they change her bowel habits and give her some abdominal discomfort. We discussed trying the liquid form of iron polysaccharide and reducing frequency of intake to help with abdominal pain.   She reports some fatigue. She says that she is not able to move as quickly as she generally would.  She reports symptoms of PICA saying that she has been eating ice for the last 6-7 months.   We discussed getting IV iron such as Venofer of Injectafer based on labs during next visit.  We further discussed potential for alpha thalassemia trait.  No smoking or alcohol.  No fever, chills, or night sweats. No other new changes in bowel habits. No other new or acute focal symptoms.  Labs done 05/06/2021 were reviewed in detail. CBC stable.  CMP unremarkable. Iron sat ratio is 6%. Ferritin is 7.  INTERVAL HISTORY:   Jacqueline Griffin is a wonderful 47 y.o. female who is here for evaluation and consultation for iron deficiency anemia.   She was last seen by me on 03/30/2022 and complained of heavy menstrual cycles with clotting and respiratory illness.   Today, she complains of  significant recent tiredness. She experiences fluctuating heaviness with her menstrual cycles. Patient reports endorsing heavy bleeding for almost the entirety of January 2024. Patient denies any ice cravings at this time. She was told by OB/GYN that fibroids were causing her heavy periods. Patient was also not recommended to take birth control tablets due to her age.   Patient does endorse tongue soreness especially on the edges.  She regularly takes a womens' multivitamin and Vitamin D. Patient has recently purchased vitamin B complex supplements.   MEDICAL HISTORY:  Past Medical History:  Diagnosis Date   Anemia    Anxiety    Chronic GERD    Depression    Fatty liver    Food allergy    Hypertension 09/18/2017   Iron deficiency    Joint pain    Morbid obesity (HCC) 04/01/2012   NASH (nonalcoholic steatohepatitis) 11/28/2012   OSA (obstructive sleep apnea)    OSA on CPAP 04/29/2016   Palpitations    Paroxysmal supraventricular tachycardia 04/01/2009   Overview:    Supraventricular tachycardia 1997   Symptomatic PVCs    Thyroid nodule 11/07/2014   Uterine fibroid     SURGICAL HISTORY: Past Surgical History:  Procedure Laterality Date   CESAREAN SECTION     HIP SURGERY     MANDIBLE RECONSTRUCTION  47 yo    SOCIAL HISTORY: Social History   Socioeconomic History   Marital status: Single    Spouse name: Not on file   Number of children: Not on file   Years of education:  Not on file   Highest education level: Not on file  Occupational History   Occupation: Theatre stage manager Document Specialist - [employer]  Tobacco Use   Smoking status: Never   Smokeless tobacco: Never  Vaping Use   Vaping Use: Never used  Substance and Sexual Activity   Alcohol use: No   Drug use: No   Sexual activity: Yes    Birth control/protection: Condom  Other Topics Concern   Not on file  Social History Narrative   Not on file   Social Determinants of Health   Financial Resource  Strain: Not on file  Food Insecurity: Not on file  Transportation Needs: Not on file  Physical Activity: Not on file  Stress: Not on file  Social Connections: Not on file  Intimate Partner Violence: Not on file    FAMILY HISTORY: Family History  Problem Relation Age of Onset   Leukemia Mother    Hypertension Father    Diabetes Father    Heart disease Father    Kidney disease Father    Sleep apnea Father    Drug abuse Father    Obesity Father     ALLERGIES:  is allergic to shrimp [shellfish allergy], amoxicillin, cephalosporins, ciprofloxacin, clindamycin/lincomycin, doxycycline, and shrimp flavor.  MEDICATIONS:  Current Outpatient Medications  Medication Sig Dispense Refill   losartan (COZAAR) 25 MG tablet Take 25 mg by mouth daily.     metoprolol succinate (TOPROL-XL) 100 MG 24 hr tablet Take 100 mg by mouth daily. Take with or immediately following a meal.     Vitamin D, Ergocalciferol, (DRISDOL) 1.25 MG (50000 UNIT) CAPS capsule Take 1 capsule (50,000 Units total) by mouth every 7 (seven) days. 4 capsule 0   No current facility-administered medications for this visit.    REVIEW OF SYSTEMS:    10 Point review of Systems was done is negative except as noted above.   PHYSICAL EXAMINATION: Vitals:   09/28/22 0923  BP: (!) 158/80  Pulse: 70  Resp: 20  Temp: 98.1 F (36.7 C)  SpO2: 100%   GENERAL:alert, in no acute distress and comfortable SKIN: no acute rashes, no significant lesions EYES: conjunctiva are pink and non-injected, sclera anicteric OROPHARYNX: MMM, no exudates, no oropharyngeal erythema or ulceration NECK: supple, no JVD LYMPH:  no palpable lymphadenopathy in the cervical, axillary or inguinal regions LUNGS: clear to auscultation b/l with normal respiratory effort HEART: regular rate & rhythm ABDOMEN:  normoactive bowel sounds , non tender, not distended. Extremity: no pedal edema PSYCH: alert & oriented x 3 with fluent speech NEURO: no focal  motor/sensory deficits   LABORATORY DATA:  I have reviewed the data as listed  .[name]    Latest Ref Rng & Units 09/28/2022    8:39 AM 03/30/2022    9:55 AM 11/08/2021   12:12 PM  CBC  WBC 4.0 - 10.5 K/uL 9.6  11.8  10.6   Hemoglobin 12.0 - 15.0 g/dL 16.1  09.6  04.5   Hematocrit 36.0 - 46.0 % 38.0  39.2  36.5   Platelets 150 - 400 K/uL 338  299  309    .    Latest Ref Rng & Units 09/28/2022    8:39 AM 03/31/2022   11:52 AM 03/30/2022    9:55 AM  CMP  Glucose 70 - 99 mg/dL 82  69  81   BUN 6 - 20 mg/dL 10  7  8    Creatinine 0.44 - 1.00 mg/dL 4.09  8.11  9.14  Sodium 135 - 145 mmol/L 138  136  137   Potassium 3.5 - 5.1 mmol/L 3.6  3.6  3.7   Chloride 98 - 111 mmol/L 104  99  104   CO2 22 - 32 mmol/L 29  23  28    Calcium 8.9 - 10.3 mg/dL 9.3  9.1  9.2   Total Protein 6.5 - 8.1 g/dL 7.3  7.7  7.3   Total Bilirubin 0.3 - 1.2 mg/dL 0.5  1.0  1.0   Alkaline Phos 38 - 126 U/L 80  89  73   AST 15 - 41 U/L 12  13  11    ALT 0 - 44 U/L 7  9  6     . Lab Results  Component Value Date   IRON 29 09/28/2022   TIBC 386 09/28/2022   IRONPCTSAT 8 (L) 09/28/2022   (Iron and TIBC)  Lab Results  Component Value Date   FERRITIN 11 09/28/2022       RADIOGRAPHIC STUDIES: I have personally reviewed the radiological images as listed and agreed with the findings in the report. No results found.  US pelvis done 07/08/2021 revealed "Uterus: 9.48 x 5.23 x 6.82 cm. anteverted  Endometrial stripe: 11.17 mm  Fibroids: yes: 1) 3.49 x 3.45 x 3.30 cm and 2) 0.80 x 0.80 x 0.95 cm  Endometrial mass: none seen  Cervix: normal  Right ovary: normal, no masses  Left ovary: normal, no masses  Other findings of note: none   Stable fibroid uterus. Discussed in depth at her visit "    ASSESSMENT & PLAN:   47 y.o. very pleasant female with  1.Microcytic Anemia likely due to Iron deficiency anemia -Labs done 05/06/2021 show iron sat of 6 and ferritin of 7. -Labs done 08/16/2021 show  Iron sat ratio is  5%. Ferritin is 5. -Labs done 09/28/2022 show iron sat of 12% and ferritin of 33.  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 Alpha thalassemia genotype revealed "Alpha-+-thalassemia trait, alpha alpha/alpha-  Mutation(s) identified: alpha3.7 " consistent with alpha thal trait  Plan:  -Discussed lab results on 09/28/2022 in detail with patient. CBC showed WBC of 9.6K, hemoglobin decreased to 12.2 from 13.1 previously, and platelets of 338K.  -patient is becoming microcytic at this time -iron saturation decreased from 20% previously to 8% at this time -ferritin is back down to 11 -Patient's ferritin level was 60 at her last visit and pt's ferritin was previously in the 10s prior to the last visit -patient is noted to have Alpha thalassemia trait -discussed option of oral iron, which may take longer to be effective and may be harder on the stomach -informed patient that estrogen-based birth control may increase the risk of blood clots with increased age -discussed option of endometrial ablation to manage heavy periods -discussed option of Mirena IUD to manage heavy menstrual cycles -discussed option of Lupron injections every 3 months to control bleeding -will set up patient forIV Venofer 300mg  weekly x 3 doses, patient has tolerated IV iron well previosuly -Patient has a scheduled visit with OB/GYN on November 09 2022. Advised patient to connect with OB/GYN to see if she can be seen sooner -Advised Pt to continue following up with her OBGYN for heavy menstrual periods and discuss options.  -recommend patient to take vitamin B complex supplements for at least 2-3 months to support bone marrow function -continue multivitamin and vitamin D regularly -will continue to monitor with labs in 6 months  Follow up:  IV Venofer 300mg  weekly x 3 doses starting in 7-10 days RTC with Dr Candise Che with labs in 6 months  The total time spent in the appointment was 23 minutes* .  All of the  patient's questions were answered with apparent satisfaction. The patient knows to call the clinic with any problems, questions or concerns.   Wyvonnia Lora MD MS AAHIVMS [facility] Hematology/Oncology Physician [facility]  .*Total Encounter Time as defined by the Centers for Medicare and Medicaid Services includes, in addition to the face-to-face time of a patient visit (documented in the note above) non-face-to-face time: obtaining and reviewing outside history, ordering and reviewing medications, tests or procedures, care coordination (communications with other health care professionals or caregivers) and documentation in the medical record.    I,Mitra Faeizi,acting as a Neurosurgeon for Wyvonnia Lora, MD.,have documented all relevant documentation on the behalf of Wyvonnia Lora, MD,as directed by  Wyvonnia Lora, MD while in the presence of Wyvonnia Lora, MD.  .I have reviewed the above documentation for accuracy and completeness, and I agree with the above. Johney Maine MD

## 2022-09-30 DIAGNOSIS — F4389 Other reactions to severe stress: Secondary | ICD-10-CM | POA: Diagnosis not present

## 2022-10-03 DIAGNOSIS — R55 Syncope and collapse: Secondary | ICD-10-CM | POA: Diagnosis not present

## 2022-10-04 ENCOUNTER — Encounter: Payer: Self-pay | Admitting: Hematology

## 2022-10-06 ENCOUNTER — Encounter (INDEPENDENT_AMBULATORY_CARE_PROVIDER_SITE_OTHER): Payer: Self-pay | Admitting: Family Medicine

## 2022-10-06 ENCOUNTER — Telehealth (INDEPENDENT_AMBULATORY_CARE_PROVIDER_SITE_OTHER): Payer: BC Managed Care – PPO | Admitting: Family Medicine

## 2022-10-06 VITALS — BP 123/75 | HR 86 | Temp 97.6°F | Ht 66.0 in | Wt 255.0 lb

## 2022-10-06 DIAGNOSIS — Z6841 Body Mass Index (BMI) 40.0 and over, adult: Secondary | ICD-10-CM

## 2022-10-06 DIAGNOSIS — E669 Obesity, unspecified: Secondary | ICD-10-CM | POA: Diagnosis not present

## 2022-10-06 DIAGNOSIS — D508 Other iron deficiency anemias: Secondary | ICD-10-CM

## 2022-10-06 DIAGNOSIS — F419 Anxiety disorder, unspecified: Secondary | ICD-10-CM

## 2022-10-06 DIAGNOSIS — F32A Depression, unspecified: Secondary | ICD-10-CM

## 2022-10-06 NOTE — Progress Notes (Signed)
TeleHealth Visit:  Due to the COVID-19 pandemic, this visit was completed with telemedicine (audio/video) technology to reduce patient and provider exposure as well as to preserve personal protective equipment.   Jacqueline Griffin has verbally consented to this TeleHealth visit. The patient is located at home, the provider is located at the [facility] office. The participants in this visit include the listed provider and patient. The visit was conducted today via MyChart video.   Chief Complaint: OBESITY Jacqueline Griffin is here to discuss her progress with her obesity treatment plan along with follow-up of her obesity related diagnoses. Jacqueline Griffin is on the Category 3 Plan and states she is following her eating plan approximately 50% of the time. Jacqueline Griffin states she is walking 8,500-10,000 steps.   Today's visit was #: 27 Starting weight: 263 lbs Starting date: 10/06/2020 Today's weight: 255 lbs Today's date: 10/06/2022 Total lbs lost to date: 8 Total lbs lost since last in-office visit: 0  Interim History: Patient had labs done last week at hematology and showed her iron has decreased again.  She is going to see her OB/Gyn soon.  She is feeling pretty stressed currently.  She is seeing a therapist now.  She has the zoloft at home but she isn't taking it.  Subjective:   1. Other iron deficiency anemia Patient's recent iron level was low with plans for more infusions.  Improvement in symptoms with infusions.  2. Anxiety and depression Patient has sertraline but has not started taking it.  Patient has felt overwhelmed and isolating between sadness and frustration.  She wants to start medications.  Assessment/Plan:   1. Other iron deficiency anemia Patient is to follow-up with Hematology at her next scheduled infusion.  2. Anxiety and depression Patient is to start her medications with a therapist at her next scheduled appointment.  3. BMI 40.0-44.9, adult (HCC)  4. Obesity with  starting BMI of 42.4 Jacqueline Griffin is currently in the action stage of change. As such, her goal is to continue with weight loss efforts. She has agreed to practicing portion control and making smarter food choices, such as increasing vegetables and decreasing simple carbohydrates.   Exercise goals: No exercise has been prescribed at this time.  Behavioral modification strategies: increasing lean protein intake, meal planning and cooking strategies, keeping healthy foods in the home, and planning for success.  Jacqueline Griffin has agreed to follow-up with our clinic in 4 weeks. She was informed of the importance of frequent follow-up visits to maximize her success with intensive lifestyle modifications for her multiple health conditions.  Objective:   VITALS: Per patient if applicable, see vitals. GENERAL: Alert and in no acute distress. CARDIOPULMONARY: No increased WOB. Speaking in clear sentences.  PSYCH: Pleasant and cooperative. Speech normal rate and rhythm. Affect is appropriate. Insight and judgement are appropriate. Attention is focused, linear, and appropriate.  NEURO: Oriented as arrived to appointment on time with no prompting.   Lab Results  Component Value Date   CREATININE 0.58 10/12/2022   BUN 13 10/12/2022   NA 138 10/12/2022   K 4.0 10/12/2022   CL 107 10/12/2022   CO2 26 10/12/2022   Lab Results  Component Value Date   ALT 6 10/12/2022   AST 11 (L) 10/12/2022   ALKPHOS 81 10/12/2022   BILITOT 0.7 10/12/2022   Lab Results  Component Value Date   HGBA1C 5.6 03/31/2022   HGBA1C 5.5 05/06/2021   HGBA1C 5.5 10/06/2020   Lab Results  Component Value Date  INSULIN 8.0 03/31/2022   INSULIN 13.5 05/06/2021   INSULIN 22.0 10/06/2020   Lab Results  Component Value Date   TSH 1.490 10/06/2020   Lab Results  Component Value Date   CHOL 178 03/31/2022   HDL 44 03/31/2022   LDLCALC 118 (H) 03/31/2022   TRIG 85 03/31/2022   CHOLHDL 3.8 05/06/2021   Lab Results   Component Value Date   VD25OH 22.5 (L) 03/31/2022   VD25OH 29.3 (L) 05/06/2021   VD25OH 16.0 (L) 10/06/2020   Lab Results  Component Value Date   WBC 11.4 (H) 10/12/2022   HGB 12.2 10/12/2022   HCT 36.6 10/12/2022   MCV 77.4 (L) 10/12/2022   PLT 303 10/12/2022   Lab Results  Component Value Date   IRON 29 09/28/2022   TIBC 386 09/28/2022   FERRITIN 11 09/28/2022    Attestation Statements:   Reviewed by clinician on day of visit: allergies, medications, problem list, medical history, surgical history, family history, social history, and previous encounter notes.   I, Burt Knack, am acting as transcriptionist for Reuben Likes, MD.  I have reviewed the above documentation for accuracy and completeness, and I agree with the above. Reuben Likes, MD'

## 2022-10-11 ENCOUNTER — Other Ambulatory Visit: Payer: Self-pay

## 2022-10-11 DIAGNOSIS — D508 Other iron deficiency anemias: Secondary | ICD-10-CM

## 2022-10-12 ENCOUNTER — Inpatient Hospital Stay: Payer: BC Managed Care – PPO

## 2022-10-12 VITALS — BP 131/53 | HR 77 | Temp 98.4°F | Resp 18

## 2022-10-12 DIAGNOSIS — D508 Other iron deficiency anemias: Secondary | ICD-10-CM

## 2022-10-12 DIAGNOSIS — D563 Thalassemia minor: Secondary | ICD-10-CM

## 2022-10-12 DIAGNOSIS — D509 Iron deficiency anemia, unspecified: Secondary | ICD-10-CM | POA: Diagnosis not present

## 2022-10-12 DIAGNOSIS — D751 Secondary polycythemia: Secondary | ICD-10-CM | POA: Diagnosis not present

## 2022-10-12 LAB — CBC WITH DIFFERENTIAL (CANCER CENTER ONLY)
Abs Immature Granulocytes: 0.03 10*3/uL (ref 0.00–0.07)
Basophils Absolute: 0 10*3/uL (ref 0.0–0.1)
Basophils Relative: 0 %
Eosinophils Absolute: 0.2 10*3/uL (ref 0.0–0.5)
Eosinophils Relative: 2 %
HCT: 36.6 % (ref 36.0–46.0)
Hemoglobin: 12.2 g/dL (ref 12.0–15.0)
Immature Granulocytes: 0 %
Lymphocytes Relative: 20 %
Lymphs Abs: 2.2 10*3/uL (ref 0.7–4.0)
MCH: 25.8 pg — ABNORMAL LOW (ref 26.0–34.0)
MCHC: 33.3 g/dL (ref 30.0–36.0)
MCV: 77.4 fL — ABNORMAL LOW (ref 80.0–100.0)
Monocytes Absolute: 0.8 10*3/uL (ref 0.1–1.0)
Monocytes Relative: 7 %
Neutro Abs: 8.1 10*3/uL — ABNORMAL HIGH (ref 1.7–7.7)
Neutrophils Relative %: 71 %
Platelet Count: 303 10*3/uL (ref 150–400)
RBC: 4.73 MIL/uL (ref 3.87–5.11)
RDW: 15.7 % — ABNORMAL HIGH (ref 11.5–15.5)
WBC Count: 11.4 10*3/uL — ABNORMAL HIGH (ref 4.0–10.5)
nRBC: 0 % (ref 0.0–0.2)

## 2022-10-12 LAB — CMP (CANCER CENTER ONLY)
ALT: 6 U/L (ref 0–44)
AST: 11 U/L — ABNORMAL LOW (ref 15–41)
Albumin: 3.9 g/dL (ref 3.5–5.0)
Alkaline Phosphatase: 81 U/L (ref 38–126)
Anion gap: 5 (ref 5–15)
BUN: 13 mg/dL (ref 6–20)
CO2: 26 mmol/L (ref 22–32)
Calcium: 9.3 mg/dL (ref 8.9–10.3)
Chloride: 107 mmol/L (ref 98–111)
Creatinine: 0.58 mg/dL (ref 0.44–1.00)
GFR, Estimated: >60 mL/min (ref >60)
Glucose, Bld: 81 mg/dL (ref 70–99)
Potassium: 4 mmol/L (ref 3.5–5.1)
Sodium: 138 mmol/L (ref 135–145)
Total Bilirubin: 0.7 mg/dL (ref 0.3–1.2)
Total Protein: 7.8 g/dL (ref 6.5–8.1)

## 2022-10-12 MED ORDER — LORATADINE 10 MG PO TABS
10.0000 mg | ORAL_TABLET | Freq: Once | ORAL | Status: AC
  Administered 2022-10-12: 10 mg via ORAL
  Filled 2022-10-12: qty 1

## 2022-10-12 MED ORDER — SODIUM CHLORIDE 0.9 % IV SOLN
300.0000 mg | Freq: Once | INTRAVENOUS | Status: AC
  Administered 2022-10-12: 300 mg via INTRAVENOUS
  Filled 2022-10-12: qty 300

## 2022-10-12 MED ORDER — ACETAMINOPHEN 325 MG PO TABS: 650.0000 mg | ORAL_TABLET | Freq: Once | ORAL | Status: DC

## 2022-10-12 MED ORDER — SODIUM CHLORIDE 0.9 % IV SOLN: Freq: Once | INTRAVENOUS | Status: AC

## 2022-10-12 NOTE — Progress Notes (Signed)
 Pt declined 30 min post iron observation.

## 2022-10-12 NOTE — Patient Instructions (Signed)
Iron Sucrose Injection What is this medication? IRON SUCROSE (EYE ern SOO krose) treats low levels of iron (iron deficiency anemia) in people with kidney disease. Iron is a mineral that plays an important role in making red blood cells, which carry oxygen from your lungs to the rest of your body. This medicine may be used for other purposes; ask your health care provider or pharmacist if you have questions. COMMON BRAND NAME(S): Venofer What should I tell my care team before I take this medication? They need to know if you have any of these conditions: Anemia not caused by low iron levels Heart disease High levels of iron in the blood Kidney disease Liver disease An unusual or allergic reaction to iron, other medications, foods, dyes, or preservatives Pregnant or trying to get pregnant Breastfeeding How should I use this medication? This medication is for infusion into a vein. It is given in a hospital or clinic setting. Talk to your care team about the use of this medication in children. While this medication may be prescribed for children as young as 2 years for selected conditions, precautions do apply. Overdosage: If you think you have taken too much of this medicine contact a poison control center or emergency room at once. NOTE: This medicine is only for you. Do not share this medicine with others. What if I miss a dose? Keep appointments for follow-up doses. It is important not to miss your dose. Call your care team if you are unable to keep an appointment. What may interact with this medication? Do not take this medication with any of the following: Deferoxamine Dimercaprol Other iron products This medication may also interact with the following: Chloramphenicol Deferasirox This list may not describe all possible interactions. Give your health care provider a list of all the medicines, herbs, non-prescription drugs, or dietary supplements you use. Also tell them if you smoke,  drink alcohol, or use illegal drugs. Some items may interact with your medicine. What should I watch for while using this medication? Visit your care team regularly. Tell your care team if your symptoms do not start to get better or if they get worse. You may need blood work done while you are taking this medication. You may need to follow a special diet. Talk to your care team. Foods that contain iron include: whole grains/cereals, dried fruits, beans, or peas, leafy green vegetables, and organ meats (liver, kidney). What side effects may I notice from receiving this medication? Side effects that you should report to your care team as soon as possible: Allergic reactions--skin rash, itching, hives, swelling of the face, lips, tongue, or throat Low blood pressure--dizziness, feeling faint or lightheaded, blurry vision Shortness of breath Side effects that usually do not require medical attention (report to your care team if they continue or are bothersome): Flushing Headache Joint pain Muscle pain Nausea Pain, redness, or irritation at injection site This list may not describe all possible side effects. Call your doctor for medical advice about side effects. You may report side effects to FDA at 1-800-FDA-1088. Where should I keep my medication? This medication is given in a hospital or clinic and will not be stored at home. NOTE: This sheet is a summary. It may not cover all possible information. If you have questions about this medicine, talk to your doctor, pharmacist, or health care provider.  2024 Elsevier/Gold Standard (2021-09-22 00:00:00)  

## 2022-10-13 DIAGNOSIS — F4389 Other reactions to severe stress: Secondary | ICD-10-CM | POA: Diagnosis not present

## 2022-10-17 DIAGNOSIS — G4733 Obstructive sleep apnea (adult) (pediatric): Secondary | ICD-10-CM | POA: Diagnosis not present

## 2022-10-18 ENCOUNTER — Other Ambulatory Visit: Payer: Self-pay | Admitting: *Deleted

## 2022-10-18 DIAGNOSIS — D509 Iron deficiency anemia, unspecified: Secondary | ICD-10-CM

## 2022-10-19 ENCOUNTER — Inpatient Hospital Stay: Payer: BC Managed Care – PPO

## 2022-10-19 ENCOUNTER — Other Ambulatory Visit: Payer: Self-pay

## 2022-10-19 VITALS — BP 135/71 | HR 80 | Temp 98.0°F | Resp 18

## 2022-10-19 DIAGNOSIS — D509 Iron deficiency anemia, unspecified: Secondary | ICD-10-CM

## 2022-10-19 DIAGNOSIS — D508 Other iron deficiency anemias: Secondary | ICD-10-CM

## 2022-10-19 DIAGNOSIS — D751 Secondary polycythemia: Secondary | ICD-10-CM | POA: Diagnosis not present

## 2022-10-19 DIAGNOSIS — D563 Thalassemia minor: Secondary | ICD-10-CM | POA: Diagnosis not present

## 2022-10-19 LAB — CMP (CANCER CENTER ONLY)
ALT: 7 U/L (ref 0–44)
AST: 13 U/L — ABNORMAL LOW (ref 15–41)
Albumin: 4.3 g/dL (ref 3.5–5.0)
Alkaline Phosphatase: 94 U/L (ref 38–126)
Anion gap: 8 (ref 5–15)
BUN: 11 mg/dL (ref 6–20)
CO2: 26 mmol/L (ref 22–32)
Calcium: 9.7 mg/dL (ref 8.9–10.3)
Chloride: 105 mmol/L (ref 98–111)
Creatinine: 0.58 mg/dL (ref 0.44–1.00)
GFR, Estimated: >60 mL/min (ref >60)
Glucose, Bld: 100 mg/dL — ABNORMAL HIGH (ref 70–99)
Potassium: 3.5 mmol/L (ref 3.5–5.1)
Sodium: 139 mmol/L (ref 135–145)
Total Bilirubin: 0.7 mg/dL (ref 0.3–1.2)
Total Protein: 8.2 g/dL — ABNORMAL HIGH (ref 6.5–8.1)

## 2022-10-19 LAB — CBC WITH DIFFERENTIAL (CANCER CENTER ONLY)
Abs Immature Granulocytes: 0.03 10*3/uL (ref 0.00–0.07)
Basophils Absolute: 0 10*3/uL (ref 0.0–0.1)
Basophils Relative: 0 %
Eosinophils Absolute: 0.2 10*3/uL (ref 0.0–0.5)
Eosinophils Relative: 2 %
HCT: 39.6 % (ref 36.0–46.0)
Hemoglobin: 13 g/dL (ref 12.0–15.0)
Immature Granulocytes: 0 %
Lymphocytes Relative: 24 %
Lymphs Abs: 2.4 10*3/uL (ref 0.7–4.0)
MCH: 25.8 pg — ABNORMAL LOW (ref 26.0–34.0)
MCHC: 32.8 g/dL (ref 30.0–36.0)
MCV: 78.6 fL — ABNORMAL LOW (ref 80.0–100.0)
Monocytes Absolute: 0.3 10*3/uL (ref 0.1–1.0)
Monocytes Relative: 3 %
Neutro Abs: 6.8 10*3/uL (ref 1.7–7.7)
Neutrophils Relative %: 71 %
Platelet Count: 341 10*3/uL (ref 150–400)
RBC: 5.04 MIL/uL (ref 3.87–5.11)
RDW: 16.2 % — ABNORMAL HIGH (ref 11.5–15.5)
WBC Count: 9.7 10*3/uL (ref 4.0–10.5)
nRBC: 0 % (ref 0.0–0.2)

## 2022-10-19 MED ORDER — LORATADINE 10 MG PO TABS
10.0000 mg | ORAL_TABLET | Freq: Once | ORAL | Status: AC
  Administered 2022-10-19: 10 mg via ORAL
  Filled 2022-10-19: qty 1

## 2022-10-19 MED ORDER — SODIUM CHLORIDE 0.9 % IV SOLN
300.0000 mg | Freq: Once | INTRAVENOUS | Status: AC
  Administered 2022-10-19: 300 mg via INTRAVENOUS
  Filled 2022-10-19: qty 300

## 2022-10-19 MED ORDER — SODIUM CHLORIDE 0.9 % IV SOLN: Freq: Once | INTRAVENOUS | Status: AC

## 2022-10-19 NOTE — Patient Instructions (Signed)
Iron Sucrose Injection What is this medication? IRON SUCROSE (EYE ern SOO krose) treats low levels of iron (iron deficiency anemia) in people with kidney disease. Iron is a mineral that plays an important role in making red blood cells, which carry oxygen from your lungs to the rest of your body. This medicine may be used for other purposes; ask your health care provider or pharmacist if you have questions. COMMON BRAND NAME(S): Venofer What should I tell my care team before I take this medication? They need to know if you have any of these conditions: Anemia not caused by low iron levels Heart disease High levels of iron in the blood Kidney disease Liver disease An unusual or allergic reaction to iron, other medications, foods, dyes, or preservatives Pregnant or trying to get pregnant Breastfeeding How should I use this medication? This medication is for infusion into a vein. It is given in a hospital or clinic setting. Talk to your care team about the use of this medication in children. While this medication may be prescribed for children as young as 2 years for selected conditions, precautions do apply. Overdosage: If you think you have taken too much of this medicine contact a poison control center or emergency room at once. NOTE: This medicine is only for you. Do not share this medicine with others. What if I miss a dose? Keep appointments for follow-up doses. It is important not to miss your dose. Call your care team if you are unable to keep an appointment. What may interact with this medication? Do not take this medication with any of the following: Deferoxamine Dimercaprol Other iron products This medication may also interact with the following: Chloramphenicol Deferasirox This list may not describe all possible interactions. Give your health care provider a list of all the medicines, herbs, non-prescription drugs, or dietary supplements you use. Also tell them if you smoke,  drink alcohol, or use illegal drugs. Some items may interact with your medicine. What should I watch for while using this medication? Visit your care team regularly. Tell your care team if your symptoms do not start to get better or if they get worse. You may need blood work done while you are taking this medication. You may need to follow a special diet. Talk to your care team. Foods that contain iron include: whole grains/cereals, dried fruits, beans, or peas, leafy green vegetables, and organ meats (liver, kidney). What side effects may I notice from receiving this medication? Side effects that you should report to your care team as soon as possible: Allergic reactions--skin rash, itching, hives, swelling of the face, lips, tongue, or throat Low blood pressure--dizziness, feeling faint or lightheaded, blurry vision Shortness of breath Side effects that usually do not require medical attention (report to your care team if they continue or are bothersome): Flushing Headache Joint pain Muscle pain Nausea Pain, redness, or irritation at injection site This list may not describe all possible side effects. Call your doctor for medical advice about side effects. You may report side effects to FDA at 1-800-FDA-1088. Where should I keep my medication? This medication is given in a hospital or clinic. It will not be stored at home. NOTE: This sheet is a summary. It may not cover all possible information. If you have questions about this medicine, talk to your doctor, pharmacist, or health care provider.  2024 Elsevier/Gold Standard (2022-08-19 00:00:00)

## 2022-10-27 ENCOUNTER — Other Ambulatory Visit: Payer: Self-pay

## 2022-10-27 DIAGNOSIS — D508 Other iron deficiency anemias: Secondary | ICD-10-CM

## 2022-10-27 DIAGNOSIS — D563 Thalassemia minor: Secondary | ICD-10-CM

## 2022-10-28 ENCOUNTER — Inpatient Hospital Stay: Payer: BC Managed Care – PPO

## 2022-10-28 ENCOUNTER — Telehealth: Payer: Self-pay | Admitting: Hematology

## 2022-10-28 NOTE — Telephone Encounter (Signed)
Patient called in to reschedule appointment due to being out of town at the moment; patient is aware of rescheduled appointment times/dates

## 2022-11-01 ENCOUNTER — Ambulatory Visit (INDEPENDENT_AMBULATORY_CARE_PROVIDER_SITE_OTHER): Payer: BC Managed Care – PPO | Admitting: Family Medicine

## 2022-11-01 ENCOUNTER — Encounter (INDEPENDENT_AMBULATORY_CARE_PROVIDER_SITE_OTHER): Payer: Self-pay | Admitting: Family Medicine

## 2022-11-01 VITALS — BP 139/85 | HR 70 | Temp 97.6°F | Ht 66.0 in | Wt 250.0 lb

## 2022-11-01 DIAGNOSIS — E669 Obesity, unspecified: Secondary | ICD-10-CM

## 2022-11-01 DIAGNOSIS — D508 Other iron deficiency anemias: Secondary | ICD-10-CM

## 2022-11-01 DIAGNOSIS — Z6841 Body Mass Index (BMI) 40.0 and over, adult: Secondary | ICD-10-CM

## 2022-11-01 DIAGNOSIS — E559 Vitamin D deficiency, unspecified: Secondary | ICD-10-CM

## 2022-11-01 DIAGNOSIS — I10 Essential (primary) hypertension: Secondary | ICD-10-CM | POA: Diagnosis not present

## 2022-11-01 MED ORDER — VITAMIN D (ERGOCALCIFEROL) 1.25 MG (50000 UNIT) PO CAPS
50000.0000 [IU] | ORAL_CAPSULE | ORAL | 0 refills | Status: DC
Start: 2022-11-01 — End: 2022-11-29

## 2022-11-01 NOTE — Progress Notes (Signed)
Chief Complaint:   OBESITY Jacqueline Griffin is here to discuss her progress with her obesity treatment plan along with follow-up of her obesity related diagnoses. Jacqueline Griffin is on practicing portion control and making smarter food choices, such as increasing vegetables and decreasing simple carbohydrates and states she is following her eating plan approximately 80% of the time. Jacqueline Griffin states she is walking 7,000-10,000 steps 5 times per week.    Today's visit was #: 28 Starting weight: 263 lbs Starting date: 10/06/2020 Today's weight: 250 lbs Today's date: 11/01/2022 Total lbs lost to date: 13 Total lbs lost since last in-office visit: 5  Interim History: Patient did not start zoloft yet.  She has been mostly working and living life.  She has been following plan 80% of the time.  She is moving more and eating on plan more consistently.  She went to [facility] for a week and sweated all day. She is prepping for her son to go back to school- goes back August 26th.   Subjective:   1. Essential hypertension Patient's blood pressure is controlled today.  She denies chest pain, chest pressure, or headache.  She is on Cozaar and Toprol.  She has been walking sporadically.  2. Vitamin D deficiency Patient is on prescription vitamin D, and she notes fatigue.  3. Other iron deficiency anemia Patient's last iron infusion is in 1 week.  Her last iron level was on the low end of normal and ferritin was on the low end of normal.  Assessment/Plan:   1. Essential hypertension Patient will continue her current medications with no change in dose or medications.  2. Vitamin D deficiency Patient will continue prescription vitamin D once weekly, and we will refill for 1 month.  - Vitamin D, Ergocalciferol, (DRISDOL) 1.25 MG (50000 UNIT) CAPS capsule; Take 1 capsule (50,000 Units total) by mouth every 7 (seven) days.  Dispense: 4 capsule; Refill: 0  3. Other iron deficiency anemia Patient will continue with her  current iron infusion per Hematology.  4. BMI 40.0-44.9, adult (HCC)  5. Obesity with starting BMI of 42.4 [name] is currently in the action stage of change. As such, her goal is to continue with weight loss efforts. She has agreed to the Category 3 Plan.   Exercise goals: As is.   Behavioral modification strategies: increasing lean protein intake, meal planning and cooking strategies, keeping healthy foods in the home, and planning for success.  [name] has agreed to follow-up with our clinic in 4 weeks. She was informed of the importance of frequent follow-up visits to maximize her success with intensive lifestyle modifications for her multiple health conditions.   Objective:   Blood pressure 139/85, pulse 70, temperature 97.6 F (36.4 C), height 5\' 6"  (1.676 m), weight 250 lb (113.4 kg), SpO2 98%. Body mass index is 40.35 kg/m.  General: Cooperative, alert, well developed, in no acute distress. HEENT: Conjunctivae and lids unremarkable. Cardiovascular: Regular rhythm.  Lungs: Normal work of breathing. Neurologic: No focal deficits.   Lab Results  Component Value Date   CREATININE 0.58 10/19/2022   BUN 11 10/19/2022   NA 139 10/19/2022   K 3.5 10/19/2022   CL 105 10/19/2022   CO2 26 10/19/2022   Lab Results  Component Value Date   ALT 7 10/19/2022   AST 13 (L) 10/19/2022   ALKPHOS 94 10/19/2022   BILITOT 0.7 10/19/2022   Lab Results  Component Value Date   HGBA1C 5.6 03/31/2022   HGBA1C 5.5 05/06/2021  HGBA1C 5.5 10/06/2020   Lab Results  Component Value Date   INSULIN 8.0 03/31/2022   INSULIN 13.5 05/06/2021   INSULIN 22.0 10/06/2020   Lab Results  Component Value Date   TSH 1.490 10/06/2020   Lab Results  Component Value Date   CHOL 178 03/31/2022   HDL 44 03/31/2022   LDLCALC 118 (H) 03/31/2022   TRIG 85 03/31/2022   CHOLHDL 3.8 05/06/2021   Lab Results  Component Value Date   VD25OH 22.5 (L) 03/31/2022   VD25OH 29.3 (L) 05/06/2021   VD25OH  16.0 (L) 10/06/2020   Lab Results  Component Value Date   WBC 9.7 10/19/2022   HGB 13.0 10/19/2022   HCT 39.6 10/19/2022   MCV 78.6 (L) 10/19/2022   PLT 341 10/19/2022   Lab Results  Component Value Date   IRON 29 09/28/2022   TIBC 386 09/28/2022   FERRITIN 11 09/28/2022   Attestation Statements:   Reviewed by clinician on day of visit: allergies, medications, problem list, medical history, surgical history, family history, social history, and previous encounter notes.   I, Burt Knack, am acting as transcriptionist for Reuben Likes, MD.  I have reviewed the above documentation for accuracy and completeness, and I agree with the above. - Reuben Likes, MD

## 2022-11-03 DIAGNOSIS — R55 Syncope and collapse: Secondary | ICD-10-CM | POA: Diagnosis not present

## 2022-11-08 ENCOUNTER — Inpatient Hospital Stay: Payer: BC Managed Care – PPO | Attending: Hematology

## 2022-11-08 ENCOUNTER — Inpatient Hospital Stay: Payer: BC Managed Care – PPO

## 2022-11-08 ENCOUNTER — Other Ambulatory Visit: Payer: Self-pay

## 2022-11-08 VITALS — BP 145/60 | HR 76 | Temp 98.3°F | Resp 16

## 2022-11-08 DIAGNOSIS — D509 Iron deficiency anemia, unspecified: Secondary | ICD-10-CM | POA: Diagnosis not present

## 2022-11-08 DIAGNOSIS — D508 Other iron deficiency anemias: Secondary | ICD-10-CM

## 2022-11-08 DIAGNOSIS — L292 Pruritus vulvae: Secondary | ICD-10-CM | POA: Diagnosis not present

## 2022-11-08 DIAGNOSIS — D563 Thalassemia minor: Secondary | ICD-10-CM

## 2022-11-08 DIAGNOSIS — L900 Lichen sclerosus et atrophicus: Secondary | ICD-10-CM | POA: Diagnosis not present

## 2022-11-08 LAB — CMP (CANCER CENTER ONLY)
ALT: 6 U/L (ref 0–44)
AST: 10 U/L — ABNORMAL LOW (ref 15–41)
Albumin: 4 g/dL (ref 3.5–5.0)
Alkaline Phosphatase: 92 U/L (ref 38–126)
Anion gap: 4 — ABNORMAL LOW (ref 5–15)
BUN: 10 mg/dL (ref 6–20)
CO2: 30 mmol/L (ref 22–32)
Calcium: 9.3 mg/dL (ref 8.9–10.3)
Chloride: 105 mmol/L (ref 98–111)
Creatinine: 0.52 mg/dL (ref 0.44–1.00)
GFR, Estimated: >60 mL/min (ref >60)
Glucose, Bld: 95 mg/dL (ref 70–99)
Potassium: 3.6 mmol/L (ref 3.5–5.1)
Sodium: 139 mmol/L (ref 135–145)
Total Bilirubin: 0.7 mg/dL (ref 0.3–1.2)
Total Protein: 7.9 g/dL (ref 6.5–8.1)

## 2022-11-08 LAB — CBC WITH DIFFERENTIAL (CANCER CENTER ONLY)
Abs Immature Granulocytes: 0.02 10*3/uL (ref 0.00–0.07)
Basophils Absolute: 0 10*3/uL (ref 0.0–0.1)
Basophils Relative: 0 %
Eosinophils Absolute: 0.2 10*3/uL (ref 0.0–0.5)
Eosinophils Relative: 2 %
HCT: 39 % (ref 36.0–46.0)
Hemoglobin: 13.1 g/dL (ref 12.0–15.0)
Immature Granulocytes: 0 %
Lymphocytes Relative: 23 %
Lymphs Abs: 2.6 10*3/uL (ref 0.7–4.0)
MCH: 26.2 pg (ref 26.0–34.0)
MCHC: 33.6 g/dL (ref 30.0–36.0)
MCV: 78 fL — ABNORMAL LOW (ref 80.0–100.0)
Monocytes Absolute: 0.7 10*3/uL (ref 0.1–1.0)
Monocytes Relative: 6 %
Neutro Abs: 7.7 10*3/uL (ref 1.7–7.7)
Neutrophils Relative %: 69 %
Platelet Count: 301 10*3/uL (ref 150–400)
RBC: 5 MIL/uL (ref 3.87–5.11)
RDW: 16.4 % — ABNORMAL HIGH (ref 11.5–15.5)
WBC Count: 11.2 10*3/uL — ABNORMAL HIGH (ref 4.0–10.5)
nRBC: 0 % (ref 0.0–0.2)

## 2022-11-08 MED ORDER — LORATADINE 10 MG PO TABS
10.0000 mg | ORAL_TABLET | Freq: Once | ORAL | Status: AC
  Administered 2022-11-08: 10 mg via ORAL
  Filled 2022-11-08: qty 1

## 2022-11-08 MED ORDER — SODIUM CHLORIDE 0.9 % IV SOLN: Freq: Once | INTRAVENOUS | Status: AC

## 2022-11-08 MED ORDER — SODIUM CHLORIDE 0.9 % IV SOLN
300.0000 mg | Freq: Once | INTRAVENOUS | Status: AC
  Administered 2022-11-08: 300 mg via INTRAVENOUS
  Filled 2022-11-08: qty 300

## 2022-11-08 MED ORDER — ACETAMINOPHEN 325 MG PO TABS: 650.0000 mg | ORAL_TABLET | Freq: Once | ORAL | Status: DC

## 2022-11-08 NOTE — Progress Notes (Signed)
Patient declined post iron infusion observation period.  Tolerated treatment well without incident. VSS at discharge.  Ambulated to lobby.

## 2022-11-08 NOTE — Patient Instructions (Signed)
 Iron Sucrose Injection What is this medication? IRON SUCROSE (EYE ern SOO krose) treats low levels of iron (iron deficiency anemia) in people with kidney disease. Iron is a mineral that plays an important role in making red blood cells, which carry oxygen from your lungs to the rest of your body. This medicine may be used for other purposes; ask your health care provider or pharmacist if you have questions. COMMON BRAND NAME(S): Venofer What should I tell my care team before I take this medication? They need to know if you have any of these conditions: Anemia not caused by low iron levels Heart disease High levels of iron in the blood Kidney disease Liver disease An unusual or allergic reaction to iron, other medications, foods, dyes, or preservatives Pregnant or trying to get pregnant Breastfeeding How should I use this medication? This medication is for infusion into a vein. It is given in a hospital or clinic setting. Talk to your care team about the use of this medication in children. While this medication may be prescribed for children as young as 2 years for selected conditions, precautions do apply. Overdosage: If you think you have taken too much of this medicine contact a poison control center or emergency room at once. NOTE: This medicine is only for you. Do not share this medicine with others. What if I miss a dose? Keep appointments for follow-up doses. It is important not to miss your dose. Call your care team if you are unable to keep an appointment. What may interact with this medication? Do not take this medication with any of the following: Deferoxamine Dimercaprol Other iron products This medication may also interact with the following: Chloramphenicol Deferasirox This list may not describe all possible interactions. Give your health care provider a list of all the medicines, herbs, non-prescription drugs, or dietary supplements you use. Also tell them if you smoke,  drink alcohol, or use illegal drugs. Some items may interact with your medicine. What should I watch for while using this medication? Visit your care team regularly. Tell your care team if your symptoms do not start to get better or if they get worse. You may need blood work done while you are taking this medication. You may need to follow a special diet. Talk to your care team. Foods that contain iron include: whole grains/cereals, dried fruits, beans, or peas, leafy green vegetables, and organ meats (liver, kidney). What side effects may I notice from receiving this medication? Side effects that you should report to your care team as soon as possible: Allergic reactions--skin rash, itching, hives, swelling of the face, lips, tongue, or throat Low blood pressure--dizziness, feeling faint or lightheaded, blurry vision Shortness of breath Side effects that usually do not require medical attention (report to your care team if they continue or are bothersome): Flushing Headache Joint pain Muscle pain Nausea Pain, redness, or irritation at injection site This list may not describe all possible side effects. Call your doctor for medical advice about side effects. You may report side effects to FDA at 1-800-FDA-1088. Where should I keep my medication? This medication is given in a hospital or clinic. It will not be stored at home. NOTE: This sheet is a summary. It may not cover all possible information. If you have questions about this medicine, talk to your doctor, pharmacist, or health care provider.  2024 Elsevier/Gold Standard (2022-08-19 00:00:00)

## 2022-11-16 DIAGNOSIS — F4389 Other reactions to severe stress: Secondary | ICD-10-CM | POA: Diagnosis not present

## 2022-11-17 DIAGNOSIS — G4733 Obstructive sleep apnea (adult) (pediatric): Secondary | ICD-10-CM | POA: Diagnosis not present

## 2022-11-29 ENCOUNTER — Ambulatory Visit (INDEPENDENT_AMBULATORY_CARE_PROVIDER_SITE_OTHER): Payer: BC Managed Care – PPO | Admitting: Physician Assistant

## 2022-11-29 ENCOUNTER — Encounter (INDEPENDENT_AMBULATORY_CARE_PROVIDER_SITE_OTHER): Payer: Self-pay | Admitting: Physician Assistant

## 2022-11-29 VITALS — BP 136/81 | HR 75 | Temp 98.0°F | Ht 66.0 in | Wt 253.0 lb

## 2022-11-29 DIAGNOSIS — D508 Other iron deficiency anemias: Secondary | ICD-10-CM

## 2022-11-29 DIAGNOSIS — I10 Essential (primary) hypertension: Secondary | ICD-10-CM

## 2022-11-29 DIAGNOSIS — Z4509 Encounter for adjustment and management of other cardiac device: Secondary | ICD-10-CM | POA: Diagnosis not present

## 2022-11-29 DIAGNOSIS — R002 Palpitations: Secondary | ICD-10-CM | POA: Diagnosis not present

## 2022-11-29 DIAGNOSIS — Z6841 Body Mass Index (BMI) 40.0 and over, adult: Secondary | ICD-10-CM

## 2022-11-29 DIAGNOSIS — E559 Vitamin D deficiency, unspecified: Secondary | ICD-10-CM | POA: Diagnosis not present

## 2022-11-29 DIAGNOSIS — R55 Syncope and collapse: Secondary | ICD-10-CM | POA: Diagnosis not present

## 2022-11-29 MED ORDER — VITAMIN D (ERGOCALCIFEROL) 1.25 MG (50000 UNIT) PO CAPS
50000.0000 [IU] | ORAL_CAPSULE | ORAL | 0 refills | Status: DC
Start: 2022-11-29 — End: 2023-01-30

## 2022-11-29 NOTE — Progress Notes (Signed)
.smr  Office: [phone]  /  Fax: [phone]  WEIGHT SUMMARY AND BIOMETRICS  Vitals Temp: 98 F (36.7 C) BP: 136/81 Pulse Rate: 75 SpO2: 98 %   Anthropometric Measurements Height: 5\' 6"  (1.676 m) Weight: 253 lb (114.8 kg) BMI (Calculated): 40.85 Weight at Last Visit: 250 lb Weight Lost Since Last Visit: 0 Weight Gained Since Last Visit: 3 lb Starting Weight: 263 lb Total Weight Loss (lbs): 10 lb (4.536 kg)   Body Composition  Body Fat %: 49.4 % Fat Mass (lbs): 125 lbs Muscle Mass (lbs): 121.6 lbs Total Body Water (lbs): 91.8 lbs Visceral Fat Rating : 14   Other Clinical Data Fasting: no Labs: no Today's Visit #: 30     HPI  Chief Complaint: OBESITY  Jacqueline Griffin is here to discuss her progress with her obesity treatment plan. She is on the the Category 3 Plan and states she is following her eating plan approximately 50 % of the time. She states she is exercising walking 9K steps 5 times per week.   Interval History:  Since last office visit she is up 3 lbs.  Hunger/appetite-moderate control. Skipping meals at times as very busy with son.  We discussed the importance of regular meals and adequate protein intake for weight loss and overall health.  Cravings- not excessive, but notes increased cravings for pink lemonade lately and we discussed trying Orissa lyte lemonades as better option.  Stress- manageable overall. 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   Pharmacotherapy: None for weight loss.  Past medical hx: NASH, OSA on CPAP, GERD, Thyroid nodule TREATMENT PLAN FOR OBESITY:  Recommended Dietary Goals  Jacqueline Griffin is currently in the action stage of change. As such, her goal is to  continue weight management plan. She has agreed to the Category 3 Plan.  Behavioral Intervention  We discussed the following Behavioral Modification Strategies today: increasing lean protein intake, decreasing simple carbohydrates , increasing vegetables, increasing lower glycemic fruits, avoiding skipping meals, increasing water intake, work on meal planning and preparation, emotional eating strategies and understanding the difference between hunger signals and cravings, work on managing stress, creating time for self-care and relaxation measures, continue to practice mindfulness when eating, and planning for success.  Additional resources provided today: NA  Recommended Physical Activity Goals  Jacqueline Griffin has been advised to work up to 150 minutes of moderate intensity aerobic activity a week and strengthening exercises 2-3 times per week for cardiovascular health, weight loss maintenance and preservation of muscle mass.   She has agreed to Continue current level of physical activity    Pharmacotherapy We discussed various medication options to help Jacqueline Griffin with her weight loss efforts and we both agreed to continue to work on nutritional and behavioral strategies to promote weight loss.      Return in about 4 weeks (around 12/27/2022).[name] She was informed of the importance of frequent follow up visits to maximize her success with intensive lifestyle modifications for her multiple health conditions.  PHYSICAL EXAM:  Blood pressure 136/81, pulse 75, temperature 98 F (36.7 C), height 5\' 6"  (1.676 m), weight 253 lb (114.8 kg), SpO2 98%. Body mass index is 40.84 kg/m.  General: She is overweight, cooperative, alert, well developed, and in no acute distress. PSYCH: Has normal mood, affect and  thought process.   Cardiovascular: HR 70's regular, BP 136/81 Lungs: Normal breathing effort, no conversational dyspnea. Neuro: no focal deficits  DIAGNOSTIC DATA REVIEWED:  BMET    Component  Value Date/Time   NA 139 11/08/2022 1359   NA 136 03/31/2022 1152   K 3.6 11/08/2022 1359   CL 105 11/08/2022 1359   CO2 30 11/08/2022 1359   GLUCOSE 95 11/08/2022 1359   BUN 10 11/08/2022 1359   BUN 7 03/31/2022 1152   CREATININE 0.52 11/08/2022 1359   CALCIUM 9.3 11/08/2022 1359   GFRNONAA >60 11/08/2022 1359   GFRAA >60 04/27/2018 0907   Lab Results  Component Value Date   HGBA1C 5.6 03/31/2022   HGBA1C 5.5 10/06/2020   Lab Results  Component Value Date   INSULIN 8.0 03/31/2022   INSULIN 22.0 10/06/2020   Lab Results  Component Value Date   TSH 1.490 10/06/2020   CBC    Component Value Date/Time   WBC 11.2 (H) 11/08/2022 1359   WBC 10.2 08/16/2021 1211   RBC 5.00 11/08/2022 1359   HGB 13.1 11/08/2022 1359   HGB 10.7 (L) 05/06/2021 0931   HCT 39.0 11/08/2022 1359   HCT 35.9 05/06/2021 0931   PLT 301 11/08/2022 1359   PLT 375 05/06/2021 0931   MCV 78.0 (L) 11/08/2022 1359   MCV 74 (L) 05/06/2021 0931   MCH 26.2 11/08/2022 1359   MCHC 33.6 11/08/2022 1359   RDW 16.4 (H) 11/08/2022 1359   RDW 17.1 (H) 05/06/2021 0931   Iron Studies    Component Value Date/Time   IRON 29 09/28/2022 0839   IRON 22 (L) 05/06/2021 0931   TIBC 386 09/28/2022 0839   TIBC 369 05/06/2021 0931   FERRITIN 11 09/28/2022 0839   FERRITIN 7 (L) 05/06/2021 0931   IRONPCTSAT 8 (L) 09/28/2022 0839   IRONPCTSAT 6 (LL) 05/06/2021 0931   Lipid Panel     Component Value Date/Time   CHOL 178 03/31/2022 1152   TRIG 85 03/31/2022 1152   HDL 44 03/31/2022 1152   CHOLHDL 3.8 05/06/2021 0931   LDLCALC 118 (H) 03/31/2022 1152   Hepatic Function Panel     Component Value Date/Time   PROT 7.9 11/08/2022 1359   PROT 7.7 03/31/2022 1152   ALBUMIN 4.0 11/08/2022 1359   ALBUMIN 4.0 03/31/2022 1152   AST 10 (L) 11/08/2022 1359   ALT 6 11/08/2022 1359   ALKPHOS 92 11/08/2022 1359   BILITOT 0.7 11/08/2022 1359      Component Value Date/Time   TSH 1.490 10/06/2020 0831   Nutritional Lab  Results  Component Value Date   VD25OH 22.5 (L) 03/31/2022   VD25OH 29.3 (L) 05/06/2021   VD25OH 16.0 (L) 10/06/2020    ASSOCIATED CONDITIONS ADDRESSED TODAY  ASSESSMENT AND PLAN  Problem List Items Addressed This Visit     Morbid obesity (HCC)   Vitamin D deficiency   Relevant Medications   Vitamin D, Ergocalciferol, (DRISDOL) 1.25 MG (50000 UNIT) CAPS capsule   Iron deficiency anemia   Essential hypertension - Primary   BMI 40.0-44.9, adult (HCC) Current BMI 40.8   Hypertension Hypertension asymptomatic, reasonably well controlled, and no significant medication side effects noted.  Medication(s): losartan 25 mg daily   Metoprolol 100 mg daily- started many years ago for SVT- followed regularly by Cardiology at [facility]. Has implantable loop recorder.  Renal function low normal range  BP Readings from Last 3 Encounters:  11/29/22 136/81  11/08/22 (!) 145/60  11/01/22 139/85  Lab Results  Component Value Date   CREATININE 0.52 11/08/2022   CREATININE 0.58 10/19/2022   CREATININE 0.58 10/12/2022   No results found for: "GFR"  Plan: Continue all antihypertensives at current dosages. Continue to work on nutrition plan to promote weight loss and improve BP control.    Vitamin D Deficiency Vitamin D is not at goal of 50.  Most recent vitamin D level was 22.5. She is on  prescription ergocalciferol 50,000 IU weekly. Lab Results  Component Value Date   VD25OH 22.5 (L) 03/31/2022   VD25OH 29.3 (L) 05/06/2021   VD25OH 16.0 (L) 10/06/2020    Plan: Continue and refill  prescription ergocalciferol 50,000 IU weekly Low vitamin D levels can be associated with adiposity and may result in leptin resistance and weight gain. Also associated with fatigue. Currently on vitamin D supplementation without any adverse effects.  Check vitamin D level next visit.   Iron deficiency anemia Anemia is stable. Iron supplementation: S/P iron infusion. Reports some improvement in  energy levels.  Lab Results  Component Value Date   WBC 11.2 (H) 11/08/2022   HGB 13.1 11/08/2022   HCT 39.0 11/08/2022   MCV 78.0 (L) 11/08/2022   PLT 301 11/08/2022   Lab Results  Component Value Date   IRON 29 09/28/2022   TIBC 386 09/28/2022   FERRITIN 11 09/28/2022     Plan: Continue nutrition plan and exercise. Monitor counts with Hem Onc periodically.    ATTESTASTION STATEMENTS:  Reviewed by clinician on day of visit: allergies, medications, problem list, medical history, surgical history, family history, social history, and previous encounter notes.   I have personally spent 35 minutes total time today in preparation, patient care, nutritional counseling and documentation for this visit, including the following: review of clinical lab tests; review of medical tests/procedures/services.      Kamsiyochukwu Buist, PA-C

## 2022-12-16 DIAGNOSIS — I493 Ventricular premature depolarization: Secondary | ICD-10-CM | POA: Diagnosis not present

## 2022-12-16 DIAGNOSIS — Z95818 Presence of other cardiac implants and grafts: Secondary | ICD-10-CM | POA: Diagnosis not present

## 2022-12-16 DIAGNOSIS — I471 Supraventricular tachycardia, unspecified: Secondary | ICD-10-CM | POA: Diagnosis not present

## 2022-12-16 DIAGNOSIS — R55 Syncope and collapse: Secondary | ICD-10-CM | POA: Diagnosis not present

## 2022-12-18 DIAGNOSIS — G4733 Obstructive sleep apnea (adult) (pediatric): Secondary | ICD-10-CM | POA: Diagnosis not present

## 2022-12-27 ENCOUNTER — Encounter (INDEPENDENT_AMBULATORY_CARE_PROVIDER_SITE_OTHER): Payer: Self-pay | Admitting: Family Medicine

## 2022-12-27 ENCOUNTER — Ambulatory Visit (INDEPENDENT_AMBULATORY_CARE_PROVIDER_SITE_OTHER): Payer: BC Managed Care – PPO | Admitting: Family Medicine

## 2022-12-27 VITALS — BP 133/76 | HR 82 | Temp 98.1°F | Ht 66.0 in | Wt 251.0 lb

## 2022-12-27 DIAGNOSIS — Z6841 Body Mass Index (BMI) 40.0 and over, adult: Secondary | ICD-10-CM

## 2022-12-27 DIAGNOSIS — I10 Essential (primary) hypertension: Secondary | ICD-10-CM | POA: Diagnosis not present

## 2022-12-27 DIAGNOSIS — E669 Obesity, unspecified: Secondary | ICD-10-CM

## 2022-12-27 DIAGNOSIS — D508 Other iron deficiency anemias: Secondary | ICD-10-CM

## 2022-12-27 NOTE — Progress Notes (Signed)
Chief Complaint:   OBESITY Jacqueline Griffin is here to discuss her progress with her obesity treatment plan along with follow-up of her obesity related diagnoses. Jacqueline Griffin is on the Category 3 Plan and states she is following her eating plan approximately 85% of the time. Jacqueline Griffin states she is walking 8,000-9,000 steps.   Today's visit was #: 30 Starting weight: 263 lbs Starting date: 10/06/2020 Today's weight: 251 lbs Today's date: 12/27/2022 Total lbs lost to date: 12 Total lbs lost since last in-office visit: 2  Interim History: Patient has incorporated Wheaties Protein into her routine and is feeling full and satisfied from this.  She is eating protein Quest chips and bought 2 of the [employer] meals.  She is focusing on increasing her total protein for the day.  She has been working in more consistently prepared foods that are easy to prepare. Over the next month she has an audit at work and a school trip.   Subjective:   1. Essential hypertension Patient's blood pressure is controlled today.  She denies chest pain, chest pressure, or headache.  She is on losartan daily.  2. Other iron deficiency anemia Patient's diagnosis is due to blood loss from menstrual cycle.  She is status post iron infusions.  She discussed plans for IUD with GYN.  Assessment/Plan:   1. Essential hypertension Patient will continue losartan 25 mg with no change in dose.  2. Other iron deficiency anemia We will follow-up on whether the patient got an IUD inserted.  3. BMI 40.0-44.9, adult (HCC)  4. Obesity with starting BMI of 42.4 Jacqueline Griffin is currently in the action stage of change. As such, her goal is to continue with weight loss efforts. She has agreed to the Category 3 Plan.   Exercise goals: All adults should avoid inactivity. Some physical activity is better than none, and adults who participate in any amount of physical activity gain some health benefits.  Behavioral modification strategies:  increasing lean protein intake, meal planning and cooking strategies, keeping healthy foods in the home, and planning for success.  Jacqueline Griffin has agreed to follow-up with our clinic in 4 weeks. She was informed of the importance of frequent follow-up visits to maximize her success with intensive lifestyle modifications for her multiple health conditions.   Objective:   Blood pressure 133/76, pulse 82, temperature 98.1 F (36.7 C), height 5\' 6"  (1.676 m), weight 251 lb (113.9 kg), SpO2 98%. Body mass index is 40.51 kg/m.  General: Cooperative, alert, well developed, in no acute distress. HEENT: Conjunctivae and lids unremarkable. Cardiovascular: Regular rhythm.  Lungs: Normal work of breathing. Neurologic: No focal deficits.   Lab Results  Component Value Date   CREATININE 0.52 11/08/2022   BUN 10 11/08/2022   NA 139 11/08/2022   K 3.6 11/08/2022   CL 105 11/08/2022   CO2 30 11/08/2022   Lab Results  Component Value Date   ALT 6 11/08/2022   AST 10 (L) 11/08/2022   ALKPHOS 92 11/08/2022   BILITOT 0.7 11/08/2022   Lab Results  Component Value Date   HGBA1C 5.6 03/31/2022   HGBA1C 5.5 05/06/2021   HGBA1C 5.5 10/06/2020   Lab Results  Component Value Date   INSULIN 8.0 03/31/2022   INSULIN 13.5 05/06/2021   INSULIN 22.0 10/06/2020   Lab Results  Component Value Date   TSH 1.490 10/06/2020   Lab Results  Component Value Date   CHOL 178 03/31/2022   HDL 44 03/31/2022   LDLCALC 118 (H)  03/31/2022   TRIG 85 03/31/2022   CHOLHDL 3.8 05/06/2021   Lab Results  Component Value Date   VD25OH 22.5 (L) 03/31/2022   VD25OH 29.3 (L) 05/06/2021   VD25OH 16.0 (L) 10/06/2020   Lab Results  Component Value Date   WBC 11.2 (H) 11/08/2022   HGB 13.1 11/08/2022   HCT 39.0 11/08/2022   MCV 78.0 (L) 11/08/2022   PLT 301 11/08/2022   Lab Results  Component Value Date   IRON 29 09/28/2022   TIBC 386 09/28/2022   FERRITIN 11 09/28/2022   Attestation Statements:    Reviewed by clinician on day of visit: allergies, medications, problem list, medical history, surgical history, family history, social history, and previous encounter notes.  Time spent on visit including pre-visit chart review and post-visit care and charting was 30 minutes.   I, Burt Knack, am acting as transcriptionist for Reuben Likes, MD.  I have reviewed the above documentation for accuracy and completeness, and I agree with the above. - Reuben Likes, MD

## 2022-12-29 DIAGNOSIS — F4389 Other reactions to severe stress: Secondary | ICD-10-CM | POA: Diagnosis not present

## 2023-01-01 DIAGNOSIS — I471 Supraventricular tachycardia, unspecified: Secondary | ICD-10-CM | POA: Diagnosis not present

## 2023-01-15 ENCOUNTER — Other Ambulatory Visit: Payer: Self-pay

## 2023-01-15 ENCOUNTER — Ambulatory Visit
Admission: EM | Admit: 2023-01-15 | Discharge: 2023-01-15 | Disposition: A | Discharge status: Home / Self Care | Payer: BC Managed Care – PPO | Attending: Family Medicine | Admitting: Family Medicine

## 2023-01-15 DIAGNOSIS — S61239A Puncture wound without foreign body of unspecified finger without damage to nail, initial encounter: Secondary | ICD-10-CM

## 2023-01-15 DIAGNOSIS — Z23 Encounter for immunization: Secondary | ICD-10-CM | POA: Diagnosis not present

## 2023-01-15 LAB — POCT URINE PREGNANCY: Preg Test, Ur: NEGATIVE

## 2023-01-15 MED ORDER — TETANUS-DIPHTH-ACELL PERTUSSIS 5-2.5-18.5 LF-MCG/0.5 IM SUSY
0.5000 mL | PREFILLED_SYRINGE | Freq: Once | INTRAMUSCULAR | Status: AC
  Administered 2023-01-15: 0.5 mL via INTRAMUSCULAR

## 2023-01-15 NOTE — ED Triage Notes (Signed)
Patient states she punctured her right pointer finger today with a pair of unclean tweezers and is wanting a tetanus injection.

## 2023-01-15 NOTE — Discharge Instructions (Addendum)
Advised patient if symptoms worsen and/or unresolved please follow-up with PCP or here for further evaluation.

## 2023-01-15 NOTE — ED Provider Notes (Signed)
[facility]    CSN: [id number] Arrival date & time: 01/15/23  1009      History   Chief Complaint Chief Complaint  Patient presents with   Puncture Wound    HPI Jacqueline Griffin is a 47 y.o. female.   HPI pleasant 47 year old female presents with right index puncture wound attempting to remove splinter with unclean tweezers and is now wanting tetanus injection.  PMH significant for morbid obesity, OSA, and palpitations.  Past Medical History:  Diagnosis Date   Anemia    Anxiety    Chronic GERD    Depression    Fatty liver    Food allergy    Hypertension 09/18/2017   Iron deficiency    Joint pain    Morbid obesity (HCC) 04/01/2012   NASH (nonalcoholic steatohepatitis) 11/28/2012   OSA (obstructive sleep apnea)    OSA on CPAP 04/29/2016   Palpitations    Paroxysmal supraventricular tachycardia (HCC) 04/01/2009   Overview:    Supraventricular tachycardia (HCC) 1997   Symptomatic PVCs    Thyroid nodule 11/07/2014   Uterine fibroid     Patient Active Problem List   Diagnosis Date Noted   Essential hypertension 11/29/2022   BMI 40.0-44.9, adult (HCC) Current BMI 40.8 11/29/2022   Alpha thalassemia trait 09/28/2022   Vitamin D deficiency 10/07/2020   Iron deficiency anemia 10/07/2020   Hearing loss 09/18/2017   Heart burn 09/18/2017   Hypertension 09/18/2017   OSA on CPAP 04/29/2016   Thyroid nodule 11/07/2014   NASH (nonalcoholic steatohepatitis) 11/28/2012   Morbid obesity (HCC) 04/01/2012   Menorrhagia 09/25/2010   Paroxysmal supraventricular tachycardia (HCC) 04/01/2009    Past Surgical History:  Procedure Laterality Date   CESAREAN SECTION     HIP SURGERY     MANDIBLE RECONSTRUCTION  47 yo    OB History     Gravida  2   Para      Term      Preterm      AB  1   Living  0      SAB  1   IAB      Ectopic      Multiple      Live Births               Home Medications    Prior to Admission medications    Medication Sig Start Date End Date Taking? Authorizing Provider  losartan (COZAAR) 25 MG tablet Take 25 mg by mouth daily.    [provider]  metoprolol succinate (TOPROL-XL) 100 MG 24 hr tablet Take 100 mg by mouth daily. Take with or immediately following a meal.    [provider]  Vitamin D, Ergocalciferol, (DRISDOL) 1.25 MG (50000 UNIT) CAPS capsule Take 1 capsule (50,000 Units total) by mouth every 7 (seven) days. 11/29/22   Rayburn, Fanny Bien, PA-C    Family History Family History  Problem Relation Age of Onset   Leukemia Mother    Hypertension Father    Diabetes Father    Heart disease Father    Kidney disease Father    Sleep apnea Father    Drug abuse Father    Obesity Father     Social History Social History   Tobacco Use   Smoking status: Never   Smokeless tobacco: Never  Vaping Use   Vaping status: Never Used  Substance Use Topics   Alcohol use: No   Drug use: No     Allergies  Shrimp [shellfish allergy], Amoxicillin, Cephalosporins, Ciprofloxacin, Clindamycin/lincomycin, Doxycycline, and Shrimp flavor   Review of Systems Review of Systems   Physical Exam Triage Vital Signs ED Triage Vitals  Encounter Vitals Group     BP 01/15/23 1035 (!) 143/86     Systolic BP Percentile --      Diastolic BP Percentile --      Pulse Rate 01/15/23 1035 85     Resp 01/15/23 1035 18     Temp 01/15/23 1035 98 F (36.7 C)     Temp Source 01/15/23 1035 Oral     SpO2 01/15/23 1035 97 %     Weight --      Height --      Head Circumference --      Peak Flow --      Pain Score 01/15/23 1037 0     Pain Loc --      Pain Education --      Exclude from Growth Chart --    No data found.  Updated Vital Signs BP (!) 143/86 (BP Location: Right Arm)   Pulse 85   Temp 98 F (36.7 C) (Oral)   Resp 18   LMP 11/29/2022 Comment: pt states she has not had a cycle this month (yet)  SpO2 97%      Physical Exam Vitals and nursing note reviewed.   Constitutional:      Appearance: Normal appearance. She is obese.  HENT:     Head: Normocephalic and atraumatic.     Mouth/Throat:     Mouth: Mucous membranes are moist.     Pharynx: Oropharynx is clear.  Eyes:     Extraocular Movements: Extraocular movements intact.     Conjunctiva/sclera: Conjunctivae normal.     Pupils: Pupils are equal, round, and reactive to light.  Cardiovascular:     Rate and Rhythm: Normal rate and regular rhythm.     Pulses: Normal pulses.     Heart sounds: Normal heart sounds.  Pulmonary:     Effort: Pulmonary effort is normal.     Breath sounds: Normal breath sounds. No wheezing, rhonchi or rales.  Musculoskeletal:        General: Normal range of motion.     Cervical back: Normal range of motion and neck supple.  Skin:    General: Skin is warm and dry.     Comments: Right index finger: Tiny microscopic pinpoint puncture, nonerythematous, nonindurated, nonfluctuant, no lymphatic streaking from a tweezer please see image below  Neurological:     General: No focal deficit present.     Mental Status: She is alert and oriented to person, place, and time. Mental status is at baseline.  Psychiatric:        Mood and Affect: Mood normal.        Behavior: Behavior normal.      UC Treatments / Results  Labs (all labs ordered are listed, but only abnormal results are displayed) Labs Reviewed  POCT URINE PREGNANCY    EKG   Radiology No results found.  Procedures Procedures (including critical care time)  Medications Ordered in UC Medications  Tdap (BOOSTRIX) injection 0.5 mL (0.5 mLs Intramuscular Given 01/15/23 1109)    Initial Impression / Assessment and Plan / UC Course  I have reviewed the triage vital signs and the nursing notes.  Pertinent labs & imaging results that were available during my care of the patient were reviewed by me and considered in my medical decision making (see chart  for details).     MDM: 1.  Puncture wound of  finger, initial encounter-Tdap injection 0.5 mL given once in clinic and prior to discharge. Advised patient if symptoms worsen and/or unresolved please follow-up with PCP or here for further evaluation.  Patient discharged home, hemodynamically stable. Final Clinical Impressions(s) / UC Diagnoses   Final diagnoses:  Puncture wound of finger, initial encounter     Discharge Instructions      Advised patient if symptoms worsen and/or unresolved please follow-up with PCP or here for further evaluation.     ED Prescriptions   None    PDMP not reviewed this encounter.   Trevor Iha, FNP 01/15/23 1135

## 2023-01-30 ENCOUNTER — Ambulatory Visit (INDEPENDENT_AMBULATORY_CARE_PROVIDER_SITE_OTHER): Payer: BC Managed Care – PPO | Admitting: Family Medicine

## 2023-01-30 ENCOUNTER — Encounter (INDEPENDENT_AMBULATORY_CARE_PROVIDER_SITE_OTHER): Payer: Self-pay | Admitting: Family Medicine

## 2023-01-30 DIAGNOSIS — E559 Vitamin D deficiency, unspecified: Secondary | ICD-10-CM | POA: Diagnosis not present

## 2023-01-30 DIAGNOSIS — Z6841 Body Mass Index (BMI) 40.0 and over, adult: Secondary | ICD-10-CM | POA: Diagnosis not present

## 2023-01-30 DIAGNOSIS — I10 Essential (primary) hypertension: Secondary | ICD-10-CM | POA: Diagnosis not present

## 2023-01-30 MED ORDER — VITAMIN D (ERGOCALCIFEROL) 1.25 MG (50000 UNIT) PO CAPS
50000.0000 [IU] | ORAL_CAPSULE | ORAL | 0 refills | Status: DC
Start: 2023-01-30 — End: 2023-04-05

## 2023-01-30 NOTE — Assessment & Plan Note (Addendum)
On Rx medication. Last lab 10 months ago.  Patient needs repeat Vitamin D level at next appointment.  Change Rx to every other week until labs done at next appointment.

## 2023-01-30 NOTE — Assessment & Plan Note (Signed)
Blood pressure elevated today.  She is not experiencing chest pain, chest pressure or headache.  She took her medication shortly before the appointment.  Blood pressure was well controlled previously.

## 2023-01-30 NOTE — Progress Notes (Signed)
[facility] [place] [address] [place] [address] Dept: [phone] Dept Fax: [phone]  SUBJECTIVE:  Chief Complaint: Obesity  Interim History: Patient has had more difficulty staying on plan over the last month.  She mentions that her stomach isn't feeling the best today. She is hopeful her anxiety is better after the election.  She is tired of food and isn't going to cook for thanksgiving- she is planning on ordering out.  Nuha is here to discuss her progress with her obesity treatment plan. She is on the Category 3 Plan and states she is following her eating plan approximately 70 % of the time. She states she is walking 9,000 to 10,000 steps 5 times per week.   OBJECTIVE: Visit Diagnoses: Problem List Items Addressed This Visit       Cardiovascular and Mediastinum   Hypertension    Blood pressure elevated today.  She is not experiencing chest pain, chest pressure or headache.  She took her medication shortly before the appointment.  Blood pressure was well controlled previously.         Other   Morbid obesity (HCC) - Primary   Vitamin D deficiency    On Rx medication. Last lab 10 months ago.  Patient needs repeat Vitamin D level at next appointment.  Change Rx to every other week until labs done at next appointment.      Relevant Medications   Vitamin D, Ergocalciferol, (DRISDOL) 1.25 MG (50000 UNIT) CAPS capsule   BMI 40.0-44.9, adult (HCC) Current BMI 40.8    Vitals Temp: 98 F (36.7 C) BP: 139/76 Pulse Rate: 71 SpO2: 98 %   Anthropometric Measurements Height: 5\' 6"  (1.676 m) Weight: 253 lb (114.8 kg) BMI (Calculated): 40.85 Weight at Last Visit: 251 lb Weight Lost Since Last Visit: 0 Weight Gained Since Last Visit: 2 Starting Weight: 263 lb Total Weight Loss (lbs): 10 lb (4.536 kg)   Body Composition  Body Fat %: 48.5 % Fat Mass (lbs): 123 lbs Muscle Mass (lbs):  124.2 lbs Total Body Water (lbs): 88.4 lbs Visceral Fat Rating : 14   Other Clinical Data Fasting: no Labs: no Today's Visit #: 32 Starting Date: 10/06/20     ASSESSMENT AND PLAN:  Diet: Rachella is currently in the action stage of change. As such, her goal is to continue with weight loss efforts. She has agreed to Category 3 Plan.  Exercise: Marigrace has been instructed that some exercise is better than none for weight loss and overall health benefits.   Behavior Modification:  We discussed the following Behavioral Modification Strategies today: increasing lean protein intake, increasing vegetables, meal planning and cooking strategies, and holiday eating strategies. We discussed various medication options to help Tyrihanna with her weight loss efforts and we both agreed to working on getting consistent nutrition in.  No follow-ups on file.[name] She was informed of the importance of frequent follow up visits to maximize her success with intensive lifestyle modifications for her multiple health conditions.  Attestation Statements:   Reviewed by clinician on day of visit: allergies, medications, problem list, medical history, surgical history, family history, social history, and previous encounter notes.    Reuben Likes, MD

## 2023-02-06 DIAGNOSIS — I493 Ventricular premature depolarization: Secondary | ICD-10-CM | POA: Diagnosis not present

## 2023-02-09 DIAGNOSIS — G4733 Obstructive sleep apnea (adult) (pediatric): Secondary | ICD-10-CM | POA: Diagnosis not present

## 2023-02-10 DIAGNOSIS — D219 Benign neoplasm of connective and other soft tissue, unspecified: Secondary | ICD-10-CM | POA: Diagnosis not present

## 2023-02-10 DIAGNOSIS — Z01812 Encounter for preprocedural laboratory examination: Secondary | ICD-10-CM | POA: Diagnosis not present

## 2023-02-10 DIAGNOSIS — L292 Pruritus vulvae: Secondary | ICD-10-CM | POA: Diagnosis not present

## 2023-02-10 DIAGNOSIS — N926 Irregular menstruation, unspecified: Secondary | ICD-10-CM | POA: Diagnosis not present

## 2023-02-19 DIAGNOSIS — I493 Ventricular premature depolarization: Secondary | ICD-10-CM | POA: Diagnosis not present

## 2023-02-19 DIAGNOSIS — Z4509 Encounter for adjustment and management of other cardiac device: Secondary | ICD-10-CM | POA: Diagnosis not present

## 2023-02-21 DIAGNOSIS — K219 Gastro-esophageal reflux disease without esophagitis: Secondary | ICD-10-CM | POA: Diagnosis not present

## 2023-02-21 DIAGNOSIS — F4389 Other reactions to severe stress: Secondary | ICD-10-CM | POA: Diagnosis not present

## 2023-03-01 ENCOUNTER — Ambulatory Visit (INDEPENDENT_AMBULATORY_CARE_PROVIDER_SITE_OTHER): Payer: BC Managed Care – PPO | Admitting: Family Medicine

## 2023-03-01 VITALS — BP 124/75 | HR 77 | Temp 97.6°F | Ht 66.0 in | Wt 254.0 lb

## 2023-03-01 DIAGNOSIS — I10 Essential (primary) hypertension: Secondary | ICD-10-CM | POA: Diagnosis not present

## 2023-03-01 DIAGNOSIS — Z6841 Body Mass Index (BMI) 40.0 and over, adult: Secondary | ICD-10-CM

## 2023-03-01 DIAGNOSIS — E7849 Other hyperlipidemia: Secondary | ICD-10-CM | POA: Diagnosis not present

## 2023-03-01 DIAGNOSIS — E559 Vitamin D deficiency, unspecified: Secondary | ICD-10-CM

## 2023-03-01 DIAGNOSIS — E88819 Insulin resistance, unspecified: Secondary | ICD-10-CM

## 2023-03-01 DIAGNOSIS — D509 Iron deficiency anemia, unspecified: Secondary | ICD-10-CM | POA: Diagnosis not present

## 2023-03-01 DIAGNOSIS — D508 Other iron deficiency anemias: Secondary | ICD-10-CM

## 2023-03-01 NOTE — Progress Notes (Signed)
SUBJECTIVE:  Chief Complaint: Obesity  Interim History: Patient has had a good few weeks.She stayed home for Thanksgiving and cooked and ate.  She ate today accidentally. She hasn't done well following the plan.  She skipped quite a few meals and ate meals off plan more than she normally does.  When she did this she felt ill for a week. Wants to get back on track.  Unfortunately she ate this am so we cannot do fasting labs.   Bibi is here to discuss her progress with her obesity treatment plan. She is on the Category 3 Plan and states she is following her eating plan approximately 30 % of the time. She states she is walking at work.   OBJECTIVE: Visit Diagnoses: Problem List Items Addressed This Visit       Cardiovascular and Mediastinum   Hypertension - Primary   BP well controlled today.  No chest pain, chest pressure or headache.  Patient on toprol and losartan.  Continue current meds with no changes to doses or meds.        Endocrine   Insulin resistance   Last labs done in August with A1c of 5.6 and insulin only slightly elevated at 8.  Repeat labs ordered today to be done with labs at hematology.  CMP, A1c, Insulin.      Relevant Orders   Comprehensive metabolic panel   Hemoglobin A1c   Insulin, random     Other   Morbid obesity (HCC)   Vitamin D deficiency   Last lab from January of this year.  Still on Rx Vitamin D.  Repeat lab ordered today to be done at earliest convenience.  Discuss labs at next appointment.      Relevant Orders   VITAMIN D 25 Hydroxy (Vit-D Deficiency, Fractures)   Iron deficiency anemia   Patient has upcoming labs with hematology.  Discussed heavy menses with GYN and was given medication to help stop her cycles.  She is still considering IUD. Follow up on whether or not patient started medication at next appointment.        BMI 40.0-44.9, adult (HCC) Current BMI 40.8   Other hyperlipidemia   The 10-year ASCVD risk score (Arnett DK, et  al., 2019) is: 2.9%   Values used to calculate the score:     Age: 47 years     Sex: Female     Is Non-Hispanic African American: Yes     Diabetic: No     Tobacco smoker: No     Systolic Blood Pressure: 124 mmHg     Is BP treated: Yes     HDL Cholesterol: 44 mg/dL     Total Cholesterol: 178 mg/dL Risk is minimally elevated.  Repeat FLP ordered today to be done at earliest convenience.      Relevant Orders   Lipid Panel With LDL/HDL Ratio    No data recorded  No data recorded  No data recorded  No data recorded    ASSESSMENT AND PLAN:  Diet: Talitha is currently in the action stage of change. As such, her goal is to continue with weight loss efforts. She has agreed to Category 3 Plan.  Exercise: Tomeca has been instructed that some exercise is better than none for weight loss and overall health benefits.   Behavior Modification:  We discussed the following Behavioral Modification Strategies today: increasing lean protein intake, increasing vegetables, meal planning and cooking strategies, holiday eating strategies, and planning for success.   No  follow-ups on file.[name] She was informed of the importance of frequent follow up visits to maximize her success with intensive lifestyle modifications for her multiple health conditions.  Attestation Statements:   Reviewed by clinician on day of visit: allergies, medications, problem list, medical history, surgical history, family history, social history, and previous encounter notes.   Reuben Likes, MD

## 2023-03-01 NOTE — Assessment & Plan Note (Signed)
BP well controlled today.  No chest pain, chest pressure or headache.  Patient on toprol and losartan.  Continue current meds with no changes to doses or meds.

## 2023-03-01 NOTE — Assessment & Plan Note (Signed)
Patient has upcoming labs with hematology.  Discussed heavy menses with GYN and was given medication to help stop her cycles.  She is still considering IUD. Follow up on whether or not patient started medication at next appointment.

## 2023-03-09 DIAGNOSIS — I493 Ventricular premature depolarization: Secondary | ICD-10-CM | POA: Diagnosis not present

## 2023-03-11 DIAGNOSIS — G4733 Obstructive sleep apnea (adult) (pediatric): Secondary | ICD-10-CM | POA: Diagnosis not present

## 2023-03-12 DIAGNOSIS — E88819 Insulin resistance, unspecified: Secondary | ICD-10-CM | POA: Insufficient documentation

## 2023-03-12 DIAGNOSIS — E7849 Other hyperlipidemia: Secondary | ICD-10-CM | POA: Insufficient documentation

## 2023-03-12 NOTE — Assessment & Plan Note (Signed)
Last lab from January of this year.  Still on Rx Vitamin D.  Repeat lab ordered today to be done at earliest convenience.  Discuss labs at next appointment.

## 2023-03-12 NOTE — Assessment & Plan Note (Addendum)
Last labs done in August with A1c of 5.6 and insulin only slightly elevated at 8.  Repeat labs ordered today to be done with labs at hematology.  CMP, A1c, Insulin.

## 2023-03-12 NOTE — Assessment & Plan Note (Signed)
The 10-year ASCVD risk score (Arnett DK, et al., 2019) is: 2.9%   Values used to calculate the score:     Age: 47 years     Sex: Female     Is Non-Hispanic African American: Yes     Diabetic: No     Tobacco smoker: No     Systolic Blood Pressure: 124 mmHg     Is BP treated: Yes     HDL Cholesterol: 44 mg/dL     Total Cholesterol: 178 mg/dL Risk is minimally elevated.  Repeat FLP ordered today to be done at earliest convenience.

## 2023-03-17 DIAGNOSIS — I491 Atrial premature depolarization: Secondary | ICD-10-CM | POA: Diagnosis not present

## 2023-03-17 DIAGNOSIS — I493 Ventricular premature depolarization: Secondary | ICD-10-CM | POA: Diagnosis not present

## 2023-03-17 DIAGNOSIS — R002 Palpitations: Secondary | ICD-10-CM | POA: Diagnosis not present

## 2023-03-17 DIAGNOSIS — R000 Tachycardia, unspecified: Secondary | ICD-10-CM | POA: Diagnosis not present

## 2023-03-17 DIAGNOSIS — R55 Syncope and collapse: Secondary | ICD-10-CM | POA: Diagnosis not present

## 2023-03-31 ENCOUNTER — Inpatient Hospital Stay: Payer: BC Managed Care – PPO

## 2023-03-31 ENCOUNTER — Inpatient Hospital Stay: Payer: BC Managed Care – PPO | Admitting: Hematology

## 2023-04-04 DIAGNOSIS — F4389 Other reactions to severe stress: Secondary | ICD-10-CM | POA: Diagnosis not present

## 2023-04-05 ENCOUNTER — Encounter (INDEPENDENT_AMBULATORY_CARE_PROVIDER_SITE_OTHER): Payer: Self-pay | Admitting: Family Medicine

## 2023-04-05 ENCOUNTER — Ambulatory Visit (INDEPENDENT_AMBULATORY_CARE_PROVIDER_SITE_OTHER): Payer: BC Managed Care – PPO | Admitting: Family Medicine

## 2023-04-05 ENCOUNTER — Other Ambulatory Visit: Payer: Self-pay

## 2023-04-05 VITALS — BP 135/73 | HR 72 | Temp 98.4°F | Ht 66.0 in | Wt 256.0 lb

## 2023-04-05 DIAGNOSIS — Z6841 Body Mass Index (BMI) 40.0 and over, adult: Secondary | ICD-10-CM | POA: Diagnosis not present

## 2023-04-05 DIAGNOSIS — F419 Anxiety disorder, unspecified: Secondary | ICD-10-CM | POA: Diagnosis not present

## 2023-04-05 DIAGNOSIS — E669 Obesity, unspecified: Secondary | ICD-10-CM | POA: Diagnosis not present

## 2023-04-05 DIAGNOSIS — E559 Vitamin D deficiency, unspecified: Secondary | ICD-10-CM

## 2023-04-05 DIAGNOSIS — D508 Other iron deficiency anemias: Secondary | ICD-10-CM

## 2023-04-05 DIAGNOSIS — D563 Thalassemia minor: Secondary | ICD-10-CM

## 2023-04-05 MED ORDER — VITAMIN D (ERGOCALCIFEROL) 1.25 MG (50000 UNIT) PO CAPS: 50000.0000 [IU] | ORAL_CAPSULE | ORAL | 0 refills | Status: DC

## 2023-04-05 NOTE — Assessment & Plan Note (Signed)
 Patient last Vitamin D level in January of last year.  She is on Rx Vitamin D.  She needs a refill of Vitamin D today.  She is getting labs with hematology next week so vitamin d level ordered to ensure patient gets level at next blood draw.

## 2023-04-05 NOTE — Assessment & Plan Note (Signed)
 Patient anxiety is significantly elevated recently and is feeling more hopelessness.  She is planning to start taking Zoloft  with her therapist next week.  She does not need the Rx as she already has it.  No SI or HI today.  Follow up on symptom control at next appointment.

## 2023-04-05 NOTE — Progress Notes (Signed)
   SUBJECTIVE:  Chief Complaint: Obesity  Interim History: Patient had a good holiday season.  She stayed local and feels like she may have been in a funk.  She is feeling slightly depressed. She did talk to her therapist yesterday and has come to realization she will probably start zoloft .  She is having a hard time committing to the plan due to her intrusive thoughts.  She mentions that she ruminates on food choices, and why she struggles with adhering to plan.   Jacqueline Griffin is here to discuss her progress with her obesity treatment plan. She is on the Category 3 Plan and states she is following her eating plan approximately 75 % of the time. She states she is walking 7,000 steps 7 times per week.   OBJECTIVE: Visit Diagnoses: Problem List Items Addressed This Visit       Other   Vitamin D  deficiency - Primary   Patient last Vitamin D  level in January of last year.  She is on Rx Vitamin D .  She needs a refill of Vitamin D  today.  She is getting labs with hematology next week so vitamin d  level ordered to ensure patient gets level at next blood draw.       Relevant Medications   Vitamin D , Ergocalciferol , (DRISDOL ) 1.25 MG (50000 UNIT) CAPS capsule   Other Relevant Orders   VITAMIN D  25 Hydroxy (Vit-D Deficiency, Fractures)   Anxiety   Patient anxiety is significantly elevated recently and is feeling more hopelessness.  She is planning to start taking Zoloft  with her therapist next week.  She does not need the Rx as she already has it.  No SI or HI today.  Follow up on symptom control at next appointment.       Vitals Temp: 98.4 F (36.9 C) BP: 135/73 Pulse Rate: 72 SpO2: 99 %   Anthropometric Measurements Height: 5' 6 (1.676 m) Weight: 256 lb (116.1 kg) BMI (Calculated): 41.34 Weight at Last Visit: 254 lb Weight Lost Since Last Visit: 0 Weight Gained Since Last Visit: 2 Starting Weight: 263 lb Total Weight Loss (lbs): 7 lb (3.175 kg)   Body Composition  Body Fat %: 49  % Fat Mass (lbs): 125.6 lbs Muscle Mass (lbs): 124 lbs Total Body Water (lbs): 90.2 lbs Visceral Fat Rating : 14   Other Clinical Data Today's Visit #: 30 Starting Date: 10/06/20     ASSESSMENT AND PLAN:  Diet: Jacqueline Griffin is currently in the action stage of change. As such, her goal is to continue with weight loss efforts. She has agreed to Category 3 Plan.  Exercise: Jacqueline Griffin has been instructed that some exercise is better than none for weight loss and overall health benefits.   Behavior Modification:  We discussed the following Behavioral Modification Strategies today: increasing lean protein intake, increasing vegetables, meal planning and cooking strategies, better snacking choices, and emotional eating strategies .  Return in about 4 weeks (around 05/03/2023).Jacqueline Griffin She was informed of the importance of frequent follow up visits to maximize her success with intensive lifestyle modifications for her multiple health conditions.  Attestation Statements:   Reviewed by clinician on day of visit: allergies, medications, problem list, medical history, surgical history, family history, social history, and previous encounter notes.      Jacqueline Cho, MD

## 2023-04-06 ENCOUNTER — Inpatient Hospital Stay: Payer: BC Managed Care – PPO | Admitting: Hematology

## 2023-04-06 ENCOUNTER — Inpatient Hospital Stay: Payer: BC Managed Care – PPO | Attending: Hematology

## 2023-04-06 VITALS — BP 127/77 | HR 78 | Temp 98.3°F | Resp 18 | Wt 263.3 lb

## 2023-04-06 DIAGNOSIS — D508 Other iron deficiency anemias: Secondary | ICD-10-CM

## 2023-04-06 DIAGNOSIS — D509 Iron deficiency anemia, unspecified: Secondary | ICD-10-CM | POA: Diagnosis not present

## 2023-04-06 DIAGNOSIS — Z79899 Other long term (current) drug therapy: Secondary | ICD-10-CM | POA: Diagnosis not present

## 2023-04-06 DIAGNOSIS — D563 Thalassemia minor: Secondary | ICD-10-CM

## 2023-04-06 LAB — CBC WITH DIFFERENTIAL (CANCER CENTER ONLY)
Abs Immature Granulocytes: 0.04 10*3/uL (ref 0.00–0.07)
Basophils Absolute: 0.1 10*3/uL (ref 0.0–0.1)
Basophils Relative: 0 %
Eosinophils Absolute: 0.1 10*3/uL (ref 0.0–0.5)
Eosinophils Relative: 1 %
HCT: 37.3 % (ref 36.0–46.0)
Hemoglobin: 12.3 g/dL (ref 12.0–15.0)
Immature Granulocytes: 0 %
Lymphocytes Relative: 20 %
Lymphs Abs: 2.4 10*3/uL (ref 0.7–4.0)
MCH: 25.3 pg — ABNORMAL LOW (ref 26.0–34.0)
MCHC: 33 g/dL (ref 30.0–36.0)
MCV: 76.6 fL — ABNORMAL LOW (ref 80.0–100.0)
Monocytes Absolute: 0.7 10*3/uL (ref 0.1–1.0)
Monocytes Relative: 6 %
Neutro Abs: 8.7 10*3/uL — ABNORMAL HIGH (ref 1.7–7.7)
Neutrophils Relative %: 73 %
Platelet Count: 315 10*3/uL (ref 150–400)
RBC: 4.87 MIL/uL (ref 3.87–5.11)
RDW: 15.9 % — ABNORMAL HIGH (ref 11.5–15.5)
WBC Count: 12 10*3/uL — ABNORMAL HIGH (ref 4.0–10.5)
nRBC: 0 % (ref 0.0–0.2)

## 2023-04-06 LAB — CMP (CANCER CENTER ONLY)
ALT: <5 U/L (ref 0–44)
AST: 10 U/L — ABNORMAL LOW (ref 15–41)
Albumin: 3.9 g/dL (ref 3.5–5.0)
Alkaline Phosphatase: 81 U/L (ref 38–126)
Anion gap: 4 — ABNORMAL LOW (ref 5–15)
BUN: 14 mg/dL (ref 6–20)
CO2: 27 mmol/L (ref 22–32)
Calcium: 9.1 mg/dL (ref 8.9–10.3)
Chloride: 106 mmol/L (ref 98–111)
Creatinine: 0.6 mg/dL (ref 0.44–1.00)
GFR, Estimated: >60 mL/min (ref >60)
Glucose, Bld: 92 mg/dL (ref 70–99)
Potassium: 3.7 mmol/L (ref 3.5–5.1)
Sodium: 137 mmol/L (ref 135–145)
Total Bilirubin: 0.8 mg/dL (ref 0.0–1.2)
Total Protein: 7.8 g/dL (ref 6.5–8.1)

## 2023-04-06 LAB — FERRITIN: Ferritin: 20 ng/mL (ref 11–307)

## 2023-04-06 LAB — IRON AND IRON BINDING CAPACITY (CC-WL,HP ONLY)
Iron: 53 ug/dL (ref 28–170)
Saturation Ratios: 15 % (ref 10.4–31.8)
TIBC: 361 ug/dL (ref 250–450)
UIBC: 308 ug/dL (ref 148–442)

## 2023-04-06 NOTE — Progress Notes (Signed)
 HEMATOLOGY/ONCOLOGY CLINIC NOTE  Date of Service: 04/06/2023  Patient Care Team: Pcp, No as PCP - General  CHIEF COMPLAINTS/PURPOSE OF CONSULTATION:  Evaluation and consultation for iron  deficiency anemia.  HISTORY OF PRESENTING ILLNESS: Jacqueline Griffin is a wonderful 48 y.o. female who has been referred to us  by her Dr. [place], Jacqueline PENNER, MD for evaluation and consultation for iron  deficiency anemia. She reports She is doing well.  She notes heavy periods. She notes that in February of 2023 she was menstruating for almost a month. She reports excessive clotting during menstruation. She notes some fibroids.  We discussed that iron  deficiency might be related to heavier menstruation.  She notes that she recently started taking the iron  polysaccharide 150 mg 1x p.o daily. She says she does not like them as they change her bowel habits and give her some abdominal discomfort. We discussed trying the liquid form of iron  polysaccharide and reducing frequency of intake to help with abdominal pain.   She reports some fatigue. She says that she is not able to move as quickly as she generally would.  She reports symptoms of PICA saying that she has been eating ice for the last 6-7 months.   We discussed getting IV iron  such as Venofer  of Injectafer based on labs during next visit.  We further discussed potential for alpha thalassemia trait.  No smoking or alcohol.  No fever, chills, or night sweats. No other new changes in bowel habits. No other new or acute focal symptoms.  Labs done 05/06/2021 were reviewed in detail. CBC stable.  CMP unremarkable. Iron  sat ratio is 6%. Ferritin is 7.  INTERVAL HISTORY:   Jacqueline Griffin is a wonderful 48 y.o. female who is here for evaluation and consultation for iron  deficiency anemia.   She was last seen by me on 09/28/2022 and complained of significant tiredness, fluctuating heaviness of menstrual cycles, and tongue  soreness.  Today, she reports that she has been feeling slightly more fatigued.   Patient reports that her menstrual cycles continue to be bothersome. She reports that she was prescribed a medication to stop her periods last week which she has not yet started taking. She notes having a medication phobia causing some hesitancy to start the medication. She reports that the medication she was prescribed was possibly a progesterone-only pill. Patient reports that she has decided not to proceed with IUD at this time. She reports that she is not taking oral iron . Patient reports that she previously tolerated IV iron  well.   Patient denies any recent major infection issues, but reports mild congestion. She denies any leg swelling or abdominal pain.  MEDICAL HISTORY:  Past Medical History:  Diagnosis Date   Anemia    Anxiety    Chronic GERD    Depression    Fatty liver    Food allergy    Hypertension 09/18/2017   Iron  deficiency    Joint pain    Morbid obesity (HCC) 04/01/2012   NASH (nonalcoholic steatohepatitis) 11/28/2012   OSA (obstructive sleep apnea)    OSA on CPAP 04/29/2016   Palpitations    Paroxysmal supraventricular tachycardia (HCC) 04/01/2009   Overview:    Supraventricular tachycardia (HCC) 1997   Symptomatic PVCs    Thyroid  nodule 11/07/2014   Uterine fibroid     SURGICAL HISTORY: Past Surgical History:  Procedure Laterality Date   CESAREAN SECTION     HIP SURGERY     MANDIBLE RECONSTRUCTION  48 yo  SOCIAL HISTORY: Social History   Socioeconomic History   Marital status: Single    Spouse name: Not on file   Number of children: Not on file   Years of education: Not on file   Highest education level: Not on file  Occupational History   Occupation: Theatre Stage Manager Document Specialist - [employer]  Tobacco Use   Smoking status: Never   Smokeless tobacco: Never  Vaping Use   Vaping status: Never Used  Substance and Sexual Activity   Alcohol use: No   Drug  use: No   Sexual activity: Yes    Birth control/protection: Condom  Other Topics Concern   Not on file  Social History Narrative   Not on file   Social Drivers of Health   Financial Resource Strain: Low Risk  (05/10/2022)   Received from [facility], [facility]   Overall Financial Resource Strain (CARDIA)    Difficulty of Paying Living Expenses: Not hard at all  Food Insecurity: Low Risk  (12/16/2022)   Received from [facility]   Hunger Vital Sign    Worried About Running Out of Food in the Last Year: Never true    Ran Out of Food in the Last Year: Never true  Transportation Needs: No Transportation Needs (12/16/2022)   Received from [facility]    In the past 12 months, has lack of reliable transportation kept you from medical appointments, meetings, work or from getting things needed for daily living? : No  Physical Activity: Unknown (01/24/2022)   Received from [facility], [facility]   Exercise Vital Sign    Days of Exercise per Week: 0 days    Minutes of Exercise per Session: Not on file  Stress: Stress Concern Present (01/24/2022)   Received from [facility], [facility] - Occupational Stress Questionnaire    Feeling of Stress : To some extent  Social Connections: Unknown (01/27/2023)   Received from [facility]   Social Network    Social Network: Not on file  Intimate Partner Violence: Unknown (01/27/2023)   Received from [facility]   HITS    Physically Hurt: Not on file    Insult or Talk Down To: Not on file    Threaten Physical Harm: Not on file    Scream or Curse: Not on file    FAMILY HISTORY: Family History  Problem Relation Age of Onset   Leukemia Mother    Hypertension Father    Diabetes Father    Heart disease Father    Kidney disease Father    Sleep apnea Father    Drug abuse Father    Obesity Father     ALLERGIES:  is allergic to shrimp [shellfish allergy],  amoxicillin, cephalosporins, ciprofloxacin , clindamycin/lincomycin, doxycycline , and shrimp flavor agent (non-screening).  MEDICATIONS:  Current Outpatient Medications  Medication Sig Dispense Refill   losartan  (COZAAR ) 25 MG tablet Take 25 mg by mouth daily.     metoprolol succinate (TOPROL-XL) 100 MG 24 hr tablet Take 100 mg by mouth daily. Take with or immediately following a meal.     Vitamin D , Ergocalciferol , (DRISDOL ) 1.25 MG (50000 UNIT) CAPS capsule Take 1 capsule (50,000 Units total) by mouth every 7 (seven) days. Every other week 4 capsule 0   No current facility-administered medications for this visit.    REVIEW OF SYSTEMS:    10 Point review of Systems was done is negative except as noted above.  PHYSICAL EXAMINATION: Vitals:   04/06/23 1405  BP: 127/77  Pulse: 78  Resp: 18  Temp: 98.3 F (36.8 C)  SpO2: 100%   GENERAL:alert, in no acute distress and comfortable SKIN: no acute rashes, no significant lesions EYES: conjunctiva are pink and non-injected, sclera anicteric OROPHARYNX: MMM, no exudates, no oropharyngeal erythema or ulceration NECK: supple, no JVD LYMPH:  no palpable lymphadenopathy in the cervical, axillary or inguinal regions LUNGS: clear to auscultation b/l with normal respiratory effort HEART: regular rate & rhythm ABDOMEN:  normoactive bowel sounds , non tender, not distended. Extremity: no pedal edema PSYCH: alert & oriented x 3 with fluent speech NEURO: no focal motor/sensory deficits   LABORATORY DATA:  I have reviewed the data as listed  .SABRA    Latest Ref Rng & Units 11/08/2022    1:59 PM 10/19/2022   12:29 PM 10/12/2022    1:33 PM  CBC  WBC 4.0 - 10.5 K/uL 11.2  9.7  11.4   Hemoglobin 12.0 - 15.0 g/dL 86.8  86.9  87.7   Hematocrit 36.0 - 46.0 % 39.0  39.6  36.6   Platelets 150 - 400 K/uL 301  341  303    .    Latest Ref Rng & Units 11/08/2022    1:59 PM 10/19/2022   12:29 PM 10/12/2022    1:33 PM  CMP  Glucose 70 - 99 mg/dL 95   899  81   BUN 6 - 20 mg/dL 10  11  13    Creatinine 0.44 - 1.00 mg/dL 9.47  9.41  9.41   Sodium 135 - 145 mmol/L 139  139  138   Potassium 3.5 - 5.1 mmol/L 3.6  3.5  4.0   Chloride 98 - 111 mmol/L 105  105  107   CO2 22 - 32 mmol/L 30  26  26    Calcium 8.9 - 10.3 mg/dL 9.3  9.7  9.3   Total Protein 6.5 - 8.1 g/dL 7.9  8.2  7.8   Total Bilirubin 0.3 - 1.2 mg/dL 0.7  0.7  0.7   Alkaline Phos 38 - 126 U/L 92  94  81   AST 15 - 41 U/L 10  13  11    ALT 0 - 44 U/L 6  7  6     . Lab Results  Component Value Date   IRON  29 09/28/2022   TIBC 386 09/28/2022   IRONPCTSAT 8 (L) 09/28/2022   (Iron  and TIBC)  Lab Results  Component Value Date   FERRITIN 11 09/28/2022       RADIOGRAPHIC STUDIES: I have personally reviewed the radiological images as listed and agreed with the findings in the report. No results found.  US  pelvis done 07/08/2021 revealed Uterus: 9.48 x 5.23 x 6.82 cm. anteverted  Endometrial stripe: 11.17 mm  Fibroids: yes: 1) 3.49 x 3.45 x 3.30 cm and 2) 0.80 x 0.80 x 0.95 cm  Endometrial mass: none seen  Cervix: normal  Right ovary: normal, no masses  Left ovary: normal, no masses  Other findings of note: none   Stable fibroid uterus. Discussed in depth at her visit     ASSESSMENT & PLAN:   49 y.o. very pleasant female with  1.Microcytic Anemia likely due to Iron  deficiency anemia -Labs done 05/06/2021 show iron  sat of 6 and ferritin of 7. -Labs done 08/16/2021 show  Iron  sat ratio is 5%. Ferritin is 5. -Labs done 04/06/2023 show iron  sat of 12% and ferritin of 33. 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 Alpha thalassemia genotype revealed Alpha-+-thalassemia trait, alpha alpha/alpha-  Mutation(s) identified: alpha3.7  consistent with  alpha thal trait  Plan:  -Discussed lab results on 04/06/23 in detail with patient. CBC showed WBC of 12.0K, hemoglobin of 12.3, and platelets of 315K. -possible that elevated WBCs are related to inflammation -hgb was previously 13.1 in 10/2022 and has since decreased to 12.3 currently -iron  labs show ferritin of 20 and iron  sat of 15% -discussed ferritin goal of at least 50  -discussed that if her iron  levels are low, there would be the option for her to receive IV iron  to replace her iron . Patient has tolerated IV iron  well previously.  -discussed that there may no longer be a role for IV iron  once her periods are controlled -educated patient that a progesterone-only pill may cause some fluid retention, and she may have some initial breakthrough bleeding for some time when starting to take the medication -educated patient on different types of IUD, including Copper IUD, which generally would not control periods; progesterone-releasing IUD such as Mirena, which causes local effects changing the lining of the uterus and is generally tolerated fairly well -educated patient that a copper IUD typically causes more cramping -discussed option of having a hormonal profile blood test to evaluate her menopausal status -continue to follow with OB/GYN for continued management of heavy menstrual cycles -answered all of patient's questions in detail  Follow up: IV monoferric 1000mg  x 1 at [address] Phone visit with Dr Onesimo in 6 months Labs 1-2 days prior to phone visit  The total time spent in the appointment was 30 minutes* .  All of the patient's questions were answered with apparent satisfaction. The patient knows to call the clinic with any problems, questions or concerns.   Emaline Onesimo MD MS AAHIVMS [facility] Hematology/Oncology Physician [facility]  .*Total Encounter Time as defined by the Centers for Medicare and Medicaid Services includes, in addition to the face-to-face  time of a patient visit (documented in the note above) non-face-to-face time: obtaining and reviewing outside history, ordering and reviewing medications, tests or procedures, care coordination (communications with other health care professionals or caregivers) and documentation in the medical record.    I,Mitra Faeizi,acting as a neurosurgeon for Emaline Onesimo, MD.,have documented all relevant documentation on the behalf of Emaline Onesimo, MD,as directed by  Emaline Onesimo, MD while in the presence of Emaline Onesimo, MD.  .I have reviewed the above documentation for accuracy and completeness, and I agree with the above. .Winfred Redel Kishore Sharnice Bosler MD

## 2023-04-10 DIAGNOSIS — R002 Palpitations: Secondary | ICD-10-CM | POA: Diagnosis not present

## 2023-04-11 DIAGNOSIS — G4733 Obstructive sleep apnea (adult) (pediatric): Secondary | ICD-10-CM | POA: Diagnosis not present

## 2023-04-12 ENCOUNTER — Encounter: Payer: Self-pay | Admitting: Hematology

## 2023-04-13 ENCOUNTER — Encounter: Payer: Self-pay | Admitting: Hematology

## 2023-04-13 ENCOUNTER — Telehealth: Payer: Self-pay | Admitting: Pharmacy Technician

## 2023-04-13 NOTE — Telephone Encounter (Signed)
Dr. Candise Che / Waynetta Sandy.  Monoferric is non preferred and will be denied due to patient has not failed / tried preferrred medication.Preferred medication is Feraheme. Would you like to use Feraheme?  Auth Submission: NO AUTH NEEDED Site of care: Site of care: [facility] Payer: [employer]BS Medication & CPT/J Code(s) submitted: Feraheme (ferumoxytol) [id number] Route of submission (phone, fax, portal):  Phone # Fax # Auth type: Buy/Bill PB Units/visits requested: 2 Reference number:  Approval from: 04/13/23 to 08/11/23

## 2023-04-14 ENCOUNTER — Other Ambulatory Visit: Payer: Self-pay | Admitting: Hematology

## 2023-04-14 ENCOUNTER — Telehealth: Payer: Self-pay

## 2023-04-14 NOTE — Telephone Encounter (Signed)
Dr. Candise Che, patient will be scheduled as soon as possible.  Auth Submission: NO AUTH NEEDED Site of care: Site of care: [facility] Payer: [employer]BS Medication & CPT/J Code(s) submitted: Feraheme (ferumoxytol) [id number] Route of submission (phone, fax, portal):  Phone # Fax # Auth type: Buy/Bill PB Units/visits requested: 510mg  x 2 doses Reference number:  Approval from: 04/14/23 to 09/25/23

## 2023-04-19 ENCOUNTER — Other Ambulatory Visit (INDEPENDENT_AMBULATORY_CARE_PROVIDER_SITE_OTHER): Payer: Self-pay | Admitting: Family Medicine

## 2023-04-19 DIAGNOSIS — E559 Vitamin D deficiency, unspecified: Secondary | ICD-10-CM

## 2023-04-24 MED ORDER — LORATADINE 10 MG PO TABS: 10.0000 mg | ORAL_TABLET | Freq: Every day | ORAL | Status: DC

## 2023-04-24 MED ORDER — ACETAMINOPHEN 325 MG PO TABS: 650.0000 mg | ORAL_TABLET | Freq: Once | ORAL | Status: DC

## 2023-04-25 DIAGNOSIS — Z4509 Encounter for adjustment and management of other cardiac device: Secondary | ICD-10-CM | POA: Diagnosis not present

## 2023-05-01 MED ORDER — LORATADINE 10 MG PO TABS: 10.0000 mg | ORAL_TABLET | Freq: Every day | ORAL | Status: DC

## 2023-05-01 MED ORDER — ACETAMINOPHEN 325 MG PO TABS: 650.0000 mg | ORAL_TABLET | Freq: Once | ORAL | Status: DC

## 2023-05-03 ENCOUNTER — Ambulatory Visit (INDEPENDENT_AMBULATORY_CARE_PROVIDER_SITE_OTHER): Payer: BC Managed Care – PPO | Admitting: Family Medicine

## 2023-05-08 ENCOUNTER — Ambulatory Visit: Payer: BC Managed Care – PPO

## 2023-05-09 ENCOUNTER — Ambulatory Visit: Payer: BC Managed Care – PPO

## 2023-05-09 VITALS — BP 144/75 | HR 71 | Temp 98.2°F | Resp 16 | Ht 67.0 in | Wt 264.2 lb

## 2023-05-09 DIAGNOSIS — D508 Other iron deficiency anemias: Secondary | ICD-10-CM

## 2023-05-09 DIAGNOSIS — D563 Thalassemia minor: Secondary | ICD-10-CM | POA: Diagnosis not present

## 2023-05-09 MED ORDER — LORATADINE 10 MG PO TABS
10.0000 mg | ORAL_TABLET | Freq: Every day | ORAL | Status: DC
  Administered 2023-05-09: 10 mg via ORAL
  Filled 2023-05-09: qty 1

## 2023-05-09 MED ORDER — ACETAMINOPHEN 325 MG PO TABS: 650.0000 mg | ORAL_TABLET | Freq: Once | ORAL | Status: DC

## 2023-05-09 MED ORDER — SODIUM CHLORIDE 0.9 % IV SOLN
510.0000 mg | Freq: Once | INTRAVENOUS | Status: AC
  Administered 2023-05-09: 510 mg via INTRAVENOUS
  Filled 2023-05-09: qty 17

## 2023-05-09 NOTE — Progress Notes (Signed)
Diagnosis: Iron Deficiency Anemia  Provider:  Chilton Greathouse MD  Procedure: IV Infusion  Patient refused Tylenol pre-medication. Nurse educated patient and stressed the importance of taking pre-medications as a precaution in the event of a medication reaction. Patient verbalized understanding.   IV Type: Peripheral, IV Location: L Antecubital  Feraheme (Ferumoxytol), Dose: 510 mg  Infusion Start Time: 1148  Infusion Stop Time: 1207  Post Infusion IV Care: Observation period completed and Peripheral IV Discontinued  Discharge: Condition: Good, Destination: Home . AVS Provided  Performed by:  Wyvonne Lenz, RN

## 2023-05-11 ENCOUNTER — Encounter (INDEPENDENT_AMBULATORY_CARE_PROVIDER_SITE_OTHER): Payer: Self-pay | Admitting: Family Medicine

## 2023-05-11 ENCOUNTER — Ambulatory Visit (INDEPENDENT_AMBULATORY_CARE_PROVIDER_SITE_OTHER): Payer: BC Managed Care – PPO | Admitting: Family Medicine

## 2023-05-11 VITALS — BP 135/72 | HR 87 | Temp 97.4°F | Ht 66.0 in | Wt 257.0 lb

## 2023-05-11 DIAGNOSIS — F419 Anxiety disorder, unspecified: Secondary | ICD-10-CM

## 2023-05-11 DIAGNOSIS — D508 Other iron deficiency anemias: Secondary | ICD-10-CM

## 2023-05-11 DIAGNOSIS — Z6841 Body Mass Index (BMI) 40.0 and over, adult: Secondary | ICD-10-CM | POA: Diagnosis not present

## 2023-05-11 DIAGNOSIS — D509 Iron deficiency anemia, unspecified: Secondary | ICD-10-CM

## 2023-05-11 DIAGNOSIS — G4733 Obstructive sleep apnea (adult) (pediatric): Secondary | ICD-10-CM | POA: Diagnosis not present

## 2023-05-11 DIAGNOSIS — D500 Iron deficiency anemia secondary to blood loss (chronic): Secondary | ICD-10-CM

## 2023-05-11 NOTE — Assessment & Plan Note (Addendum)
 Patient just received iron infusion from hematology.  Her iron was WNL as was her ferritin but at lower range of normal.  She is planning to follow up with hematology as to further treatment plan.

## 2023-05-11 NOTE — Progress Notes (Signed)
   SUBJECTIVE:  Chief Complaint: Obesity  Interim History: Patient last appointment was early January. Mental health wise she is all over. She is struggling to remember all her doctors appointments.  Her son is playing basketball, she has numerous doctors appointments and she si feeling very stressed.  Jacqueline Griffin is here to discuss her progress with her obesity treatment plan. She is on the Category 3 Plan and states she is following her eating plan approximately 50 % of the time. She states she is not exercising.   OBJECTIVE: Visit Diagnoses: Problem List Items Addressed This Visit       Other   Morbid obesity (HCC)   Starting weight: 263 on 10/06/20 Peak weight: 266 BMR: 2257 Previous obesity management:  Body Fat %: 49.2% Starting Meal Plan: Category 4 Likes/ Dislikes of meal plan: likes structure of plan- would like more ready made food options.       Iron deficiency anemia - Primary   Patient just received iron infusion from hematology.  Her iron was WNL as was her ferritin but at lower range of normal.  She is planning to follow up with hematology as to further treatment plan.      BMI 40.0-44.9, adult (HCC) Current BMI 40.8   Anxiety   Patient is experiencing significant anxiety impacting her daily function.  She is ruminating on thoughts and has been trying to work herself up to starting sertraline as she is concerned about side effects. She is struggling to consistently commit to the meal plan due to her anxiety.  She has a therapist but hasn't been as consistent in seeing them recently.  Her plan is to reach out to therapy and to start her sertraline prior to next appointment.      Vitals:   05/11/23 1100  BP: 135/72  Pulse: 87  Temp: (!) 97.4 F (36.3 C)  SpO2: 100%    No data recorded No data recorded No data recorded No data recorded   ASSESSMENT AND PLAN:  Diet: Sina is currently in the action stage of change. As such, her goal is to continue with  weight loss efforts. She has agreed to Category 4 Plan.  Exercise: Sophira has been instructed that some exercise is better than none for weight loss and overall health benefits.   Behavior Modification:  We discussed the following Behavioral Modification Strategies today: increasing lean protein intake, no skipping meals, meal planning and cooking strategies, and emotional eating strategies .   No follow-ups on file.[name] She was informed of the importance of frequent follow up visits to maximize her success with intensive lifestyle modifications for her multiple health conditions.  Attestation Statements:   Reviewed by clinician on day of visit: allergies, medications, problem list, medical history, surgical history, family history, social history, and previous encounter notes.   Time spent on visit including pre-visit chart review and post-visit care and charting was 35 minutes.    Reuben Likes, MD

## 2023-05-12 DIAGNOSIS — G4733 Obstructive sleep apnea (adult) (pediatric): Secondary | ICD-10-CM | POA: Diagnosis not present

## 2023-05-16 ENCOUNTER — Ambulatory Visit: Payer: BC Managed Care – PPO

## 2023-05-16 VITALS — BP 134/77 | HR 80 | Temp 97.8°F | Resp 20 | Ht 66.0 in | Wt 265.0 lb

## 2023-05-16 DIAGNOSIS — D508 Other iron deficiency anemias: Secondary | ICD-10-CM | POA: Diagnosis not present

## 2023-05-16 DIAGNOSIS — D563 Thalassemia minor: Secondary | ICD-10-CM

## 2023-05-16 DIAGNOSIS — J301 Allergic rhinitis due to pollen: Secondary | ICD-10-CM | POA: Diagnosis not present

## 2023-05-16 DIAGNOSIS — R059 Cough, unspecified: Secondary | ICD-10-CM | POA: Diagnosis not present

## 2023-05-16 DIAGNOSIS — I10 Essential (primary) hypertension: Secondary | ICD-10-CM | POA: Diagnosis not present

## 2023-05-16 DIAGNOSIS — J069 Acute upper respiratory infection, unspecified: Secondary | ICD-10-CM | POA: Diagnosis not present

## 2023-05-16 MED ORDER — ACETAMINOPHEN 325 MG PO TABS: 650.0000 mg | ORAL_TABLET | Freq: Once | ORAL | Status: DC

## 2023-05-16 MED ORDER — LORATADINE 10 MG PO TABS
10.0000 mg | ORAL_TABLET | Freq: Every day | ORAL | Status: DC
  Administered 2023-05-16: 10 mg via ORAL
  Filled 2023-05-16: qty 1

## 2023-05-16 MED ORDER — SODIUM CHLORIDE 0.9 % IV SOLN
510.0000 mg | Freq: Once | INTRAVENOUS | Status: AC
  Administered 2023-05-16: 510 mg via INTRAVENOUS
  Filled 2023-05-16: qty 17

## 2023-05-16 NOTE — Progress Notes (Addendum)
 Diagnosis: Iron Deficiency Anemia  Provider:  Chilton Greathouse MD  Procedure: IV Infusion  IV Type: Peripheral, IV Location: R Forearm  Feraheme (Ferumoxytol), Dose: 510 mg  Infusion Start Time: 1147  Infusion Stop Time: 1205  Post Infusion IV Care: Patient declined observation and Peripheral IV Discontinued  Discharge: Condition: Good, Destination: Home . AVS Declined  Performed by:  Rico Ala, LPN  Pt refused to take tylenol 650 mg. Nurse educated pt on the importance of taking pre meds in the event if she had an adverse reaction. Pt verbalized understanding.

## 2023-05-22 NOTE — Assessment & Plan Note (Signed)
 Patient is experiencing significant anxiety impacting her daily function.  She is ruminating on thoughts and has been trying to work herself up to starting sertraline as she is concerned about side effects. She is struggling to consistently commit to the meal plan due to her anxiety.  She has a therapist but hasn't been as consistent in seeing them recently.  Her plan is to reach out to therapy and to start her sertraline prior to next appointment.

## 2023-05-22 NOTE — Assessment & Plan Note (Signed)
 Starting weight: 263 on 10/06/20 Peak weight: 266 BMR: 2257 Previous obesity management:  Body Fat %: 49.2% Starting Meal Plan: Category 4 Likes/ Dislikes of meal plan: likes structure of plan- would like more ready made food options.

## 2023-05-29 ENCOUNTER — Encounter (INDEPENDENT_AMBULATORY_CARE_PROVIDER_SITE_OTHER): Payer: Self-pay

## 2023-06-05 ENCOUNTER — Encounter (INDEPENDENT_AMBULATORY_CARE_PROVIDER_SITE_OTHER): Payer: Self-pay

## 2023-06-07 ENCOUNTER — Ambulatory Visit (INDEPENDENT_AMBULATORY_CARE_PROVIDER_SITE_OTHER): Payer: BC Managed Care – PPO | Admitting: Family Medicine

## 2023-06-12 DIAGNOSIS — Z4509 Encounter for adjustment and management of other cardiac device: Secondary | ICD-10-CM | POA: Diagnosis not present

## 2023-06-15 DIAGNOSIS — R55 Syncope and collapse: Secondary | ICD-10-CM | POA: Diagnosis not present

## 2023-06-15 DIAGNOSIS — R002 Palpitations: Secondary | ICD-10-CM | POA: Diagnosis not present

## 2023-06-15 DIAGNOSIS — Z4509 Encounter for adjustment and management of other cardiac device: Secondary | ICD-10-CM | POA: Diagnosis not present

## 2023-06-20 ENCOUNTER — Encounter (INDEPENDENT_AMBULATORY_CARE_PROVIDER_SITE_OTHER): Payer: Self-pay | Admitting: Family Medicine

## 2023-06-20 ENCOUNTER — Ambulatory Visit (INDEPENDENT_AMBULATORY_CARE_PROVIDER_SITE_OTHER): Admitting: Family Medicine

## 2023-06-20 VITALS — BP 144/85 | HR 69 | Temp 98.0°F | Ht 66.0 in | Wt 259.0 lb

## 2023-06-20 DIAGNOSIS — R2231 Localized swelling, mass and lump, right upper limb: Secondary | ICD-10-CM

## 2023-06-20 DIAGNOSIS — F419 Anxiety disorder, unspecified: Secondary | ICD-10-CM

## 2023-06-20 DIAGNOSIS — Z6841 Body Mass Index (BMI) 40.0 and over, adult: Secondary | ICD-10-CM

## 2023-06-20 NOTE — Assessment & Plan Note (Signed)
 Patient found mass on ventral right forearm about a week after last iron infusion.  She mentions it has softened and decreased in size since that time.  Will order ultrasound for further evaluation.

## 2023-06-20 NOTE — Progress Notes (Signed)
 SUBJECTIVE:  Chief Complaint: Obesity  Interim History: Patient just got a second job to help with some financial issues she is experiencing.  She is starting with 2 days a week at her part time job to help catch up.  She is stressed out.  She is concerned about a small lump she has on her forearm.  She currently is eating what her dad and child are eating to help financially.   Jacqueline Griffin is here to discuss her progress with her obesity treatment plan. She is on the Category 4 Plan and states she is following her eating plan approximately 10 % of the time. She states she is exercising walking one mile 15-20 minutes 3 times per week.   OBJECTIVE: Visit Diagnoses: Problem List Items Addressed This Visit       Other   Morbid obesity (HCC)   Anthropometric Measurements Height: 5\' 6"  (1.676 m) Weight: 259 lb (117.5 kg) BMI (Calculated): 41.82 Weight at Last Visit: 257 lb Weight Lost Since Last Visit: 0 Weight Gained Since Last Visit: 2 lb Starting Weight: 263 lb Total Weight Loss (lbs): 4 lb (1.814 kg) Peak Weight: 264 lb Body Composition  Body Fat %: 49.4 % Fat Mass (lbs): 128 lbs Muscle Mass (lbs): 124.6 lbs Total Body Water (lbs): 89.8 lbs Visceral Fat Rating : 15 Other Clinical Data Today's Visit #: 36 Starting Date: 10/06/20       BMI 40.0-44.9, adult (HCC) Current BMI 40.8   Anxiety   Has zoloft at home and has not taken.  Will bring medication with her to her next appointment to take when in office.  Symptoms increased with recent financial strain.      Mass of arm, right   Patient found mass on ventral right forearm about a week after last iron infusion.  She mentions it has softened and decreased in size since that time.  Will order ultrasound for further evaluation.      Other Visit Diagnoses       Arm mass, right    -  Primary   Relevant Orders   US SOFT TISSUE RT UPPER EXTREMITY LTD (NON-VASCULAR)       No data recorded       06/20/2023   12:00  PM 06/20/2023   11:00 AM 05/16/2023   12:05 PM  Vitals with BMI  Height  5\' 6"    Weight  259 lbs   BMI  41.82   Systolic 144 146 409  Diastolic 85 79 77  Pulse  69 80      ASSESSMENT AND PLAN:  Diet: [name] is currently in the action stage of change. As such, her goal is to continue with weight loss efforts and has agreed to the Category 4 Plan.   Exercise:  All adults should avoid inactivity. Some activity is better than none, and adults who participate in any amount of physical activity, gain some health benefits.  Behavior Modification:  We discussed the following Behavioral Modification Strategies today: increasing lean protein intake, increasing vegetables, meal planning and cooking strategies, keeping healthy foods in the home, and planning for success.   Return in about 4 weeks (around 07/18/2023).[name] She was informed of the importance of frequent follow up visits to maximize her success with intensive lifestyle modifications for her multiple health conditions.  Attestation Statements:   Reviewed by clinician on day of visit: allergies, medications, problem list, medical history, surgical history, family history, social history, and previous encounter notes.    [place]  Lawson Radar, MD

## 2023-06-20 NOTE — Assessment & Plan Note (Signed)
 Has zoloft at home and has not taken.  Will bring medication with her to her next appointment to take when in office.  Symptoms increased with recent financial strain.

## 2023-06-21 ENCOUNTER — Encounter: Payer: Self-pay | Admitting: Hematology

## 2023-06-27 NOTE — Assessment & Plan Note (Signed)
 Anthropometric Measurements Height: 5\' 6"  (1.676 m) Weight: 259 lb (117.5 kg) BMI (Calculated): 41.82 Weight at Last Visit: 257 lb Weight Lost Since Last Visit: 0 Weight Gained Since Last Visit: 2 lb Starting Weight: 263 lb Total Weight Loss (lbs): 4 lb (1.814 kg) Peak Weight: 264 lb Body Composition  Body Fat %: 49.4 % Fat Mass (lbs): 128 lbs Muscle Mass (lbs): 124.6 lbs Total Body Water (lbs): 89.8 lbs Visceral Fat Rating : 15 Other Clinical Data Today's Visit #: 36 Starting Date: 10/06/20

## 2023-07-14 DIAGNOSIS — Z1330 Encounter for screening examination for mental health and behavioral disorders, unspecified: Secondary | ICD-10-CM | POA: Diagnosis not present

## 2023-07-14 DIAGNOSIS — Z113 Encounter for screening for infections with a predominantly sexual mode of transmission: Secondary | ICD-10-CM | POA: Diagnosis not present

## 2023-07-24 ENCOUNTER — Ambulatory Visit (INDEPENDENT_AMBULATORY_CARE_PROVIDER_SITE_OTHER): Admitting: Family Medicine

## 2023-07-24 ENCOUNTER — Ambulatory Visit (INDEPENDENT_AMBULATORY_CARE_PROVIDER_SITE_OTHER)

## 2023-07-24 ENCOUNTER — Other Ambulatory Visit: Payer: Self-pay

## 2023-07-24 ENCOUNTER — Encounter (INDEPENDENT_AMBULATORY_CARE_PROVIDER_SITE_OTHER): Payer: Self-pay | Admitting: Family Medicine

## 2023-07-24 VITALS — BP 152/80 | HR 95 | Temp 97.7°F | Ht 66.0 in | Wt 256.0 lb

## 2023-07-24 VITALS — BP 154/84 | HR 71 | Ht 66.0 in

## 2023-07-24 DIAGNOSIS — I10 Essential (primary) hypertension: Secondary | ICD-10-CM

## 2023-07-24 DIAGNOSIS — E559 Vitamin D deficiency, unspecified: Secondary | ICD-10-CM

## 2023-07-24 DIAGNOSIS — Z6841 Body Mass Index (BMI) 40.0 and over, adult: Secondary | ICD-10-CM | POA: Diagnosis not present

## 2023-07-24 DIAGNOSIS — M25522 Pain in left elbow: Secondary | ICD-10-CM

## 2023-07-24 LAB — COMPREHENSIVE METABOLIC PANEL WITH GFR
ALT: 7 U/L (ref 0–35)
AST: 13 U/L (ref 0–37)
Albumin: 4.2 g/dL (ref 3.5–5.2)
Alkaline Phosphatase: 81 U/L (ref 39–117)
BUN: 10 mg/dL (ref 6–23)
CO2: 27 meq/L (ref 19–32)
Calcium: 9.5 mg/dL (ref 8.4–10.5)
Chloride: 101 meq/L (ref 96–112)
Creatinine, Ser: 0.48 mg/dL (ref 0.40–1.20)
GFR: 112.93 mL/min (ref >60.00)
Glucose, Bld: 88 mg/dL (ref 70–99)
Potassium: 3.8 meq/L (ref 3.5–5.1)
Sodium: 136 meq/L (ref 135–145)
Total Bilirubin: 0.8 mg/dL (ref 0.2–1.2)
Total Protein: 8.1 g/dL (ref 6.0–8.3)

## 2023-07-24 LAB — URIC ACID: Uric Acid, Serum: 4.4 mg/dL (ref 2.4–7.0)

## 2023-07-24 MED ORDER — HYDROCODONE-ACETAMINOPHEN 5-325 MG PO TABS: 1.0000 | ORAL_TABLET | Freq: Four times a day (QID) | ORAL | 0 refills | Status: DC | PRN

## 2023-07-24 MED ORDER — PREDNISONE 50 MG PO TABS: ORAL_TABLET | ORAL | 0 refills | Status: DC

## 2023-07-24 MED ORDER — VITAMIN D (ERGOCALCIFEROL) 1.25 MG (50000 UNIT) PO CAPS: 50000.0000 [IU] | ORAL_CAPSULE | ORAL | 0 refills | Status: AC

## 2023-07-24 NOTE — Assessment & Plan Note (Signed)
 On prescription strength vitamin d  every other week.  Needs a refill today.  No nausea, vomiting or muscle weakness reported.

## 2023-07-24 NOTE — Assessment & Plan Note (Signed)
 Blood pressure elevated today but patient is in pain due to left elbow.  Will hold off on increasing her losartan at this time and follow up on BP at next appointment- if she elevated will increase losartan to 50mg  daily.

## 2023-07-24 NOTE — Patient Instructions (Addendum)
 Thank you for coming in today.   Please get an Xray today before you leave   I've sent a prescription for hydrocodone  & Prednisone  to your pharmacy.   Please get labs today before you leave

## 2023-07-24 NOTE — Progress Notes (Signed)
 Joanna Muck, PhD, LAT, ATC acting as a scribe for Garlan Juniper, MD.  Jacqueline Griffin is a 48 y.o. female who presents to [facility] today for L elbow pain. Pt was last seen by Dr. Alease Hunter on 11/17/21 for R knee pain.  Today, pt c/o L elbow pain that started this morning. On April 18th, she recalls banging her elbow, when getting laundry out of the washer. Pain resolved. Today, she just woke up w/ severe L elbow pain. Pt locates pain to the posterior aspect of the elbow in through the cubital fossa.   Radiates: yes- distally and into thumb Paresthesia: no Grip strength: WNL, was this morning Aggravates: elbow extension Treatments tried: ice,  Pertinent review of systems: No fevers or chills  Relevant historical information: Vitamin D  deficiency.  Sleep apnea.  Anxiety.   Exam:  BP (!) 154/84   Pulse 71   Ht 5\' 6"  (1.676 m)   SpO2 98%   BMI 41.32 kg/m  General: Well Developed, well nourished, and in no acute distress.   MSK: Left elbow mild to moderate effusion.  Decreased range of motion.  Tender palpation.  Decreased strength with guarding.  Pulses cap refill and sensation are intact distally.    Lab and Radiology Results  Diagn[facility] MSK Ultrasound of: Left elbow Mild effusion present.  No visible tear at lateral or medial epicondyle or triceps. Impression: Effusion   X-ray images left elbow obtained today personally and independently interpreted. Mild degenerative changes.  No acute fractures are visible. Await formal radiology review   Assessment and Plan: 48 y.o. female with left elbow pain.  Patient developed rapid onset of pain and swelling and stiffness.  This is concerning for an elbow effusion.  Her x-ray does not show a clear source of her pain.  Gout is definitely a possibility.  Will check uric acid and start treatment with prednisone  and hydrocortisone.   PDMP reviewed during this encounter. Orders Placed  This Encounter  Procedures   DG ELBOW COMPLETE LEFT (3+VIEW)    Standing Status:   Future    Number of Occurrences:   1    Expiration Date:   08/23/2023    Reason for Exam (SYMPTOM  OR DIAGNOSIS REQUIRED):   left elbow pain    Preferred imaging location?:   [facility]    Is patient pregnant?:   No   US  LIMITED JOINT SPACE STRUCTURES UP LEFT(NO LINKED CHARGES)    Reason for Exam (SYMPTOM  OR DIAGNOSIS REQUIRED):   left elbow pain    Preferred imaging location?:   [facility]   Uric acid    Standing Status:   Future    Number of Occurrences:   1    Expiration Date:   07/23/2024   Comprehensive metabolic panel with GFR    Standing Status:   Future    Number of Occurrences:   1    Expiration Date:   07/23/2024   Meds ordered this encounter  Medications   HYDROcodone -acetaminophen  (NORCO/VICODIN) 5-325 MG tablet    Sig: Take 1 tablet by mouth every 6 (six) hours as needed.    Dispense:  15 tablet    Refill:  0   predniSONE  (DELTASONE ) 50 MG tablet    Sig: Take 1 pill daily for 5 days    Dispense:  5 tablet    Refill:  0     Discussed warning signs or symptoms. Please see  discharge instructions. Patient expresses understanding.   The above documentation has been reviewed and is accurate and complete Garlan Juniper, M.D.

## 2023-07-24 NOTE — Progress Notes (Signed)
 SUBJECTIVE:  Chief Complaint: Obesity  Interim History: Patient woke up this am with a significant pain in her left elbow and antecubital fossa.  She is planning to see orthopaedics after this appointment. She has been increasing her physical activity recently since she started her second job.  She is getting in more protein and meal prepped yesterday.  She is feeling better with the increased activity.  She is only getting about 75% of the food in.  Nothing coming up the next few weeks.  No plans at this time for the summer.  Earnie is here to discuss her progress with her obesity treatment plan. She is on the Category 4 Plan and states she is following her eating plan approximately 75 % of the time. She states she is walking 14,000 steps 5 times per week.   OBJECTIVE: Visit Diagnoses: Problem List Items Addressed This Visit       Cardiovascular and Mediastinum   Hypertension - Primary   Blood pressure elevated today but patient is in pain due to left elbow.  Will hold off on increasing her losartan at this time and follow up on BP at next appointment- if she elevated will increase losartan to 50mg  daily.         Other   Morbid obesity (HCC)   Vitamin D  deficiency   On prescription strength vitamin d  every other week.  Needs a refill today.  No nausea, vomiting or muscle weakness reported.      Relevant Medications   Vitamin D , Ergocalciferol , (DRISDOL ) 1.25 MG (50000 UNIT) CAPS capsule   BMI 40.0-44.9, adult (HCC) Current BMI 40.8    Vitals Temp: 97.7 F (36.5 C) BP: (!) 152/80 Pulse Rate: 95 SpO2: 98 %   Anthropometric Measurements Height: 5\' 6"  (1.676 m) Weight: 256 lb (116.1 kg) BMI (Calculated): 41.34 Weight at Last Visit: 259 lb Weight Lost Since Last Visit: 3 Weight Gained Since Last Visit: 0 Starting Weight: 263 lb Total Weight Loss (lbs): 7 lb (3.175 kg) Peak Weight: 264 lb   Body Composition  Body Fat %: 48.5 % Fat Mass (lbs): 124.4 lbs Muscle  Mass (lbs): 125.4 lbs Total Body Water (lbs): 89.2 lbs Visceral Fat Rating : 14   Other Clinical Data Today's Visit #: 37 Starting Date: 10/06/20 Comments: Cat 4     ASSESSMENT AND PLAN:  Diet: Leroy is currently in the action stage of change. As such, her goal is to continue with weight loss efforts and has agreed to the Category 4 Plan.   Exercise:  For substantial health benefits, adults should do at least 150 minutes (2 hours and 30 minutes) a week of moderate-intensity, or 75 minutes (1 hour and 15 minutes) a week of vigorous-intensity aerobic physical activity, or an equivalent combination of moderate- and vigorous-intensity aerobic activity. Aerobic activity should be performed in episodes of at least 10 minutes, and preferably, it should be spread throughout the week.  Behavior Modification:  We discussed the following Behavioral Modification Strategies today: increasing lean protein intake, decreasing simple carbohydrates, no skipping meals, meal planning and cooking strategies, keeping healthy foods in the home, avoiding temptations, and planning for success.   Return in about 4 weeks (around 08/21/2023).Aaron Aas She was informed of the importance of frequent follow up visits to maximize her success with intensive lifestyle modifications for her multiple health conditions.  Attestation Statements:   Reviewed by clinician on day of visit: allergies, medications, problem list, medical history, surgical history, family history, social history, and  previous encounter notes.     Donaciano Frizzle, MD

## 2023-07-25 NOTE — Progress Notes (Signed)
 Uric acid level is normal.  I am not sure why your elbow is hurting.  Prednisone  should help.  If not better we will get better imaging test such as MRI or even do a elbow injection.

## 2023-07-27 NOTE — Progress Notes (Signed)
 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  No significant arthritis is visible.  I do not think this x-ray explains your elbow swelling very well.  If not improving consider injection or MRI.

## 2023-08-09 DIAGNOSIS — G4733 Obstructive sleep apnea (adult) (pediatric): Secondary | ICD-10-CM | POA: Diagnosis not present

## 2023-08-30 ENCOUNTER — Ambulatory Visit (INDEPENDENT_AMBULATORY_CARE_PROVIDER_SITE_OTHER): Admitting: Family Medicine

## 2023-08-30 DIAGNOSIS — E041 Nontoxic single thyroid nodule: Secondary | ICD-10-CM | POA: Diagnosis not present

## 2023-08-31 ENCOUNTER — Encounter (INDEPENDENT_AMBULATORY_CARE_PROVIDER_SITE_OTHER): Payer: Self-pay | Admitting: Family Medicine

## 2023-08-31 ENCOUNTER — Ambulatory Visit (INDEPENDENT_AMBULATORY_CARE_PROVIDER_SITE_OTHER): Admitting: Family Medicine

## 2023-08-31 VITALS — BP 146/74 | HR 77 | Temp 98.2°F | Ht 66.0 in | Wt 261.0 lb

## 2023-08-31 DIAGNOSIS — Z6841 Body Mass Index (BMI) 40.0 and over, adult: Secondary | ICD-10-CM | POA: Diagnosis not present

## 2023-08-31 DIAGNOSIS — I10 Essential (primary) hypertension: Secondary | ICD-10-CM

## 2023-08-31 DIAGNOSIS — R2231 Localized swelling, mass and lump, right upper limb: Secondary | ICD-10-CM

## 2023-08-31 NOTE — Progress Notes (Signed)
   SUBJECTIVE:  Chief Complaint: Obesity  Interim History: Patient stopped working her second job but will be returning when school lets out.  She is feeling uncomfortable today due to menstrual cycle.  She has been eating out more than she did previously- hasn't been eating as much as she knows she needs to and hasn't made the mindful choices she knows she needs to.  She is experiencing some financial issues and is hoping her second job will help straighten that out.  She is meeting with a couple of surgeons to discuss plan partial thyroidectomy.    Wilbur is here to discuss her progress with her obesity treatment plan. She is on the Category 4 Plan and states she is following her eating plan approximately 65 % of the time. She states she is walking 8,000 steps 5 times per week.   OBJECTIVE: Visit Diagnoses: Problem List Items Addressed This Visit       Cardiovascular and Mediastinum   Hypertension - Primary     Other   Morbid obesity (HCC)   BMI 40.0-44.9, adult (HCC) Current BMI 40.8   Other Visit Diagnoses       Arm mass, right           Vitals Temp: 98.2 F (36.8 C) BP: (!) 146/74 Pulse Rate: 77 SpO2: 100 %   Anthropometric Measurements Height: 5\' 6"  (1.676 m) Weight: 261 lb (118.4 kg) BMI (Calculated): 42.15 Weight at Last Visit: 256 lb Weight Lost Since Last Visit: 0 Weight Gained Since Last Visit: 5 Starting Weight: 263 lb Total Weight Loss (lbs): 2 lb (0.907 kg)   Body Composition  Body Fat %: 49.6 % Fat Mass (lbs): 129.6 lbs Muscle Mass (lbs): 125.2 lbs Total Body Water (lbs): 91.4 lbs Visceral Fat Rating : 15   Other Clinical Data Today's Visit #: 57 Starting Date: 10/06/00 Comments: Cat 4     ASSESSMENT AND PLAN:  Diet: Jamilex is currently in the action stage of change. As such, her goal is to continue with weight loss efforts and has agreed to the Category 4 Plan.   Exercise:  For substantial health benefits, adults should do at  least 150 minutes (2 hours and 30 minutes) a week of moderate-intensity, or 75 minutes (1 hour and 15 minutes) a week of vigorous-intensity aerobic physical activity, or an equivalent combination of moderate- and vigorous-intensity aerobic activity. Aerobic activity should be performed in episodes of at least 10 minutes, and preferably, it should be spread throughout the week.  Behavior Modification:  We discussed the following Behavioral Modification Strategies today: increasing lean protein intake, decreasing simple carbohydrates, increasing vegetables, meal planning and cooking strategies, keeping healthy foods in the home, and planning for success.   Return in about 10 weeks (around 11/09/2023).   She was informed of the importance of frequent follow up visits to maximize her success with intensive lifestyle modifications for her multiple health conditions.  Attestation Statements:   Reviewed by clinician on day of visit: allergies, medications, problem list, medical history, surgical history, family history, social history, and previous encounter notes.   Donaciano Frizzle, MD

## 2023-09-09 DIAGNOSIS — G4733 Obstructive sleep apnea (adult) (pediatric): Secondary | ICD-10-CM | POA: Diagnosis not present

## 2023-09-15 ENCOUNTER — Other Ambulatory Visit (INDEPENDENT_AMBULATORY_CARE_PROVIDER_SITE_OTHER): Payer: Self-pay | Admitting: Family Medicine

## 2023-09-15 DIAGNOSIS — E559 Vitamin D deficiency, unspecified: Secondary | ICD-10-CM

## 2023-10-03 ENCOUNTER — Encounter (INDEPENDENT_AMBULATORY_CARE_PROVIDER_SITE_OTHER): Payer: Self-pay | Admitting: Family Medicine

## 2023-10-03 NOTE — Telephone Encounter (Signed)
 Cat 4

## 2023-10-04 ENCOUNTER — Other Ambulatory Visit: Payer: BC Managed Care – PPO

## 2023-10-06 ENCOUNTER — Telehealth: Payer: BC Managed Care – PPO | Admitting: Hematology

## 2023-10-06 ENCOUNTER — Ambulatory Visit (HOSPITAL_COMMUNITY): Admission: RE | Admit: 2023-10-06 | Source: Ambulatory Visit

## 2023-10-09 DIAGNOSIS — G4733 Obstructive sleep apnea (adult) (pediatric): Secondary | ICD-10-CM | POA: Diagnosis not present

## 2023-10-10 ENCOUNTER — Other Ambulatory Visit: Payer: Self-pay

## 2023-10-10 DIAGNOSIS — D508 Other iron deficiency anemias: Secondary | ICD-10-CM

## 2023-10-10 DIAGNOSIS — D563 Thalassemia minor: Secondary | ICD-10-CM

## 2023-10-11 ENCOUNTER — Inpatient Hospital Stay: Attending: Hematology

## 2023-10-11 DIAGNOSIS — N920 Excessive and frequent menstruation with regular cycle: Secondary | ICD-10-CM | POA: Diagnosis not present

## 2023-10-11 DIAGNOSIS — D563 Thalassemia minor: Secondary | ICD-10-CM

## 2023-10-11 DIAGNOSIS — D508 Other iron deficiency anemias: Secondary | ICD-10-CM

## 2023-10-11 DIAGNOSIS — D509 Iron deficiency anemia, unspecified: Secondary | ICD-10-CM | POA: Diagnosis not present

## 2023-10-11 LAB — CBC WITH DIFFERENTIAL (CANCER CENTER ONLY)
Abs Immature Granulocytes: 0.04 K/uL (ref 0.00–0.07)
Basophils Absolute: 0 K/uL (ref 0.0–0.1)
Basophils Relative: 0 %
Eosinophils Absolute: 0.1 K/uL (ref 0.0–0.5)
Eosinophils Relative: 1 %
HCT: 38.1 % (ref 36.0–46.0)
Hemoglobin: 12.6 g/dL (ref 12.0–15.0)
Immature Granulocytes: 0 %
Lymphocytes Relative: 20 %
Lymphs Abs: 2.4 K/uL (ref 0.7–4.0)
MCH: 26.2 pg (ref 26.0–34.0)
MCHC: 33.1 g/dL (ref 30.0–36.0)
MCV: 79.2 fL — ABNORMAL LOW (ref 80.0–100.0)
Monocytes Absolute: 0.7 K/uL (ref 0.1–1.0)
Monocytes Relative: 6 %
Neutro Abs: 8.8 K/uL — ABNORMAL HIGH (ref 1.7–7.7)
Neutrophils Relative %: 73 %
Platelet Count: 322 K/uL (ref 150–400)
RBC: 4.81 MIL/uL (ref 3.87–5.11)
RDW: 15.4 % (ref 11.5–15.5)
WBC Count: 12.1 K/uL — ABNORMAL HIGH (ref 4.0–10.5)
nRBC: 0 % (ref 0.0–0.2)

## 2023-10-11 LAB — IRON AND IRON BINDING CAPACITY (CC-WL,HP ONLY)
Iron: 29 ug/dL (ref 28–170)
Saturation Ratios: 8 % — ABNORMAL LOW (ref 10.4–31.8)
TIBC: 357 ug/dL (ref 250–450)
UIBC: 328 ug/dL (ref 148–442)

## 2023-10-11 LAB — CMP (CANCER CENTER ONLY)
ALT: 6 U/L (ref 0–44)
AST: 10 U/L — ABNORMAL LOW (ref 15–41)
Albumin: 3.9 g/dL (ref 3.5–5.0)
Alkaline Phosphatase: 74 U/L (ref 38–126)
Anion gap: 6 (ref 5–15)
BUN: 11 mg/dL (ref 6–20)
CO2: 25 mmol/L (ref 22–32)
Calcium: 9.1 mg/dL (ref 8.9–10.3)
Chloride: 108 mmol/L (ref 98–111)
Creatinine: 0.5 mg/dL (ref 0.44–1.00)
GFR, Estimated: >60 mL/min (ref >60)
Glucose, Bld: 88 mg/dL (ref 70–99)
Potassium: 3.7 mmol/L (ref 3.5–5.1)
Sodium: 139 mmol/L (ref 135–145)
Total Bilirubin: 0.5 mg/dL (ref 0.0–1.2)
Total Protein: 8 g/dL (ref 6.5–8.1)

## 2023-10-11 LAB — FERRITIN: Ferritin: 37 ng/mL (ref 11–307)

## 2023-10-12 NOTE — Progress Notes (Signed)
 HEMATOLOGY/ONCOLOGY TELEMEDICINE NOTE  Date of Service: 10/13/2023  Patient Care Team: Pcp, No as PCP - General  CHIEF COMPLAINTS/PURPOSE OF CONSULTATION:  Evaluation and consultation for iron  deficiency anemia.  HISTORY OF PRESENTING ILLNESS: Jacqueline Griffin is a wonderful 48 y.o. female who has been referred to us  by her Dr. [place], [name], MD for evaluation and consultation for iron  deficiency anemia. She reports She is doing well.  She notes heavy periods. She notes that in February of 2023 she was menstruating for almost a month. She reports excessive clotting during menstruation. She notes some fibroids.  We discussed that iron  deficiency might be related to heavier menstruation.  She notes that she recently started taking the iron  polysaccharide 150 mg 1x p.o daily. She says she does not like them as they change her bowel habits and give her some abdominal discomfort. We discussed trying the liquid form of iron  polysaccharide and reducing frequency of intake to help with abdominal pain.   She reports some fatigue. She says that she is not able to move as quickly as she generally would.  She reports symptoms of PICA saying that she has been eating ice for the last 6-7 months.   We discussed getting IV iron  such as Venofer  of Injectafer based on labs during next visit.  We further discussed potential for alpha thalassemia trait.  No smoking or alcohol.  No fever, chills, or night sweats. No other new changes in bowel habits. No other new or acute focal symptoms.  Labs done 05/06/2021 were reviewed in detail. CBC stable.  CMP unremarkable. Iron  sat ratio is 6%. Ferritin is 7.  INTERVAL HISTORY:   Jacqueline Griffin is a wonderful 48 y.o. female who is here for evaluation and consultation for iron  deficiency anemia.   She was last seen by me on 04/06/2023 and reported feeling slightly more fatigued, mild congestion, and bothersome menstrual cycles.   I  connected with Camelia LITTIE Rower on 10/13/2023 at  8:40 AM EDT by telephone visit and verified that I am speaking with the correct person using two identifiers.   I discussed the limitations, risks, security and privacy concerns of performing an evaluation and management service by telemedicine and the availability of in-person appointments. I also discussed with the patient that there may be a patient responsible charge related to this service. The patient expressed understanding and agreed to proceed.   Other persons participating in the visit and their role in the encounter: none   Patient's location: home  Provider's location: [facility]   Chief Complaint: iron  deficiency anemia    Today she reports that she has been doing well overall. She reports that she only received one dose of IV Feraheme infusion in March, and did not receive the second dose. She notes that she is starting to become mildly fatigued.   Patient continues to have extremely heavy periods which are lasting longer and are unpredictable. She notes that she was told by her OB/GYN that she is in Perimenopause.   Patient reports that she has another fibroid and is inclined to consider removal of the fibroid. She notes that her previous fibroid has grown slightly. Patient also reports some weariness towards IUD due to her friend having a poor experience with it, but notes that she will try IUD. She has her next OB/GYN appt on August 5th.   Patient reports that she has a colonoscopy in September 2025.   She notes that she purchased tablet form of  vitamin B complex but was unable to take this due to tablets being too large. Patient reports that she is not taking oral iron .   The results of her recent lab workup from 10/11/2023 was discussed with her in detail.  MEDICAL HISTORY:  Past Medical History:  Diagnosis Date   Anemia    Anxiety    Chronic GERD    Depression    Fatty liver    Food allergy    Hypertension 09/18/2017    Iron  deficiency    Joint pain    Morbid obesity (HCC) 04/01/2012   NASH (nonalcoholic steatohepatitis) 11/28/2012   OSA (obstructive sleep apnea)    OSA on CPAP 04/29/2016   Palpitations    Paroxysmal supraventricular tachycardia (HCC) 04/01/2009   Overview:    Supraventricular tachycardia (HCC) 1997   Symptomatic PVCs    Thyroid  nodule 11/07/2014   Uterine fibroid     SURGICAL HISTORY: Past Surgical History:  Procedure Laterality Date   CESAREAN SECTION     HIP SURGERY     MANDIBLE RECONSTRUCTION  47 yo    SOCIAL HISTORY: Social History   Socioeconomic History   Marital status: Single    Spouse name: Not on file   Number of children: Not on file   Years of education: Not on file   Highest education level: Not on file  Occupational History   Occupation: Theatre stage manager Document Specialist - [employer]  Tobacco Use   Smoking status: Never   Smokeless tobacco: Never  Vaping Use   Vaping status: Never Used  Substance and Sexual Activity   Alcohol use: No   Drug use: No   Sexual activity: Yes    Birth control/protection: Condom  Other Topics Concern   Not on file  Social History Narrative   Not on file   Social Drivers of Health   Financial Resource Strain: Low Risk  (07/14/2023)   Received from [facility]   Overall Financial Resource Strain (CARDIA)    Difficulty of Paying Living Expenses: Not hard at all  Food Insecurity: No Food Insecurity (07/14/2023)   Received from [facility]   Hunger Vital Sign    Within the past 12 months, you worried that your food would run out before you got the money to buy more.: Never true    Within the past 12 months, the food you bought just didn't last and you didn't have money to get more.: Never true  Transportation Needs: No Transportation Needs (07/14/2023)   Received from [facility] - Transportation    Lack of Transportation (Medical): No    Lack of Transportation (Non-Medical): No  Physical Activity:  Unknown (01/24/2022)   Received from [facility]   Exercise Vital Sign    On average, how many days per week do you engage in moderate to strenuous exercise (like a brisk walk)?: 0 days    Minutes of Exercise per Session: Not on file  Stress: Stress Concern Present (01/24/2022)   Received from [facility] - Occupational Stress Questionnaire    Feeling of Stress : To some extent  Social Connections: Unknown (01/27/2023)   Received from [facility]   Social Network    Social Network: Not on file  Intimate Partner Violence: Unknown (01/27/2023)   Received from [facility]   HITS    Physically Hurt: Not on file    Insult or Talk Down To: Not on file  Threaten Physical Harm: Not on file    Scream or Curse: Not on file    FAMILY HISTORY: Family History  Problem Relation Age of Onset   Leukemia Mother    Hypertension Father    Diabetes Father    Heart disease Father    Kidney disease Father    Sleep apnea Father    Drug abuse Father    Obesity Father     ALLERGIES:  is allergic to shrimp [shellfish allergy], amoxicillin, cephalosporins, ciprofloxacin , clindamycin/lincomycin, doxycycline , and shrimp flavor agent (non-screening).  MEDICATIONS:  Current Outpatient Medications  Medication Sig Dispense Refill   losartan (COZAAR) 25 MG tablet Take 25 mg by mouth daily.     metoprolol succinate (TOPROL-XL) 100 MG 24 hr tablet Take 100 mg by mouth daily. Take with or immediately following a meal.     Vitamin D , Ergocalciferol , (DRISDOL ) 1.25 MG (50000 UNIT) CAPS capsule Take 1 capsule (50,000 Units total) by mouth every 14 (fourteen) days. 4 capsule 0   No current facility-administered medications for this visit.    REVIEW OF SYSTEMS:    10 Point review of Systems was done is negative except as noted above.   PHYSICAL EXAMINATION: TELEMEDICINE VISIT There were no vitals filed for this visit.   LABORATORY DATA:  I have  reviewed the data as listed  .SABRA    Latest Ref Rng & Units 10/11/2023   12:28 PM 04/06/2023    1:42 PM 11/08/2022    1:59 PM  CBC  WBC 4.0 - 10.5 K/uL 12.1  12.0  11.2   Hemoglobin 12.0 - 15.0 g/dL 87.3  87.6  86.8   Hematocrit 36.0 - 46.0 % 38.1  37.3  39.0   Platelets 150 - 400 K/uL 322  315  301    .    Latest Ref Rng & Units 10/11/2023   12:28 PM 07/24/2023   11:20 AM 04/06/2023    1:42 PM  CMP  Glucose 70 - 99 mg/dL 88  88  92   BUN 6 - 20 mg/dL 11  10  14    Creatinine 0.44 - 1.00 mg/dL 9.49  9.51  9.39   Sodium 135 - 145 mmol/L 139  136  137   Potassium 3.5 - 5.1 mmol/L 3.7  3.8  3.7   Chloride 98 - 111 mmol/L 108  101  106   CO2 22 - 32 mmol/L 25  27  27    Calcium 8.9 - 10.3 mg/dL 9.1  9.5  9.1   Total Protein 6.5 - 8.1 g/dL 8.0  8.1  7.8   Total Bilirubin 0.0 - 1.2 mg/dL 0.5  0.8  0.8   Alkaline Phos 38 - 126 U/L 74  81  81   AST 15 - 41 U/L 10  13  10    ALT 0 - 44 U/L 6  7  <5    . Lab Results  Component Value Date   IRON  29 10/11/2023   TIBC 357 10/11/2023   IRONPCTSAT 8 (L) 10/11/2023   (Iron  and TIBC)  Lab Results  Component Value Date   FERRITIN 37 10/11/2023       RADIOGRAPHIC STUDIES: I have personally reviewed the radiological images as listed and agreed with the findings in the report. No results found.  US  pelvis done 07/08/2021 revealed Uterus: 9.48 x 5.23 x 6.82 cm. anteverted  Endometrial stripe: 11.17 mm  Fibroids: yes: 1) 3.49 x 3.45 x 3.30 cm and 2) 0.80 x 0.80 x 0.95  cm  Endometrial mass: none seen  Cervix: normal  Right ovary: normal, no masses  Left ovary: normal, no masses  Other findings of note: none   Stable fibroid uterus. Discussed in depth at her visit     ASSESSMENT & PLAN:   48 y.o. very pleasant female with  1.Microcytic Anemia likely due to Iron  deficiency anemia -Labs done 05/06/2021 show iron  sat of 6 and ferritin of 7. -Labs done 08/16/2021 show  Iron  sat ratio is 5%. Ferritin is 5. -Labs done 10/12/2023  show iron  sat of 12% and ferritin of 33. . 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 Alpha thalassemia genotype revealed Alpha-+-thalassemia trait, alpha alpha/alpha-  Mutation(s) identified: alpha3.7  consistent with alpha thal trait  Plan:  -Discussed lab results from 10/11/2023 in detail with patient. CBC normal, showed WBC of 12.1K, hemoglobin of 12.6, and platelets of 322K. -platelets normal -WBCs tend to run higher, which could be from inflammation -CMP normal -patient noted to have only received one dose of IV feraheme instead of 2 -iron  levels mildly improved -iron  saturation 8% -discussed iron  saturation goal of at least 20% -ferritin 37 -discussed ferritin goal of at least 50 -discussed option of proceeding with second dose of IV iron  -pt is agreeable to adding a second dose of IV feraheme -recommend taking vitamin B complex. She may take liquid version or smaller tablet form.  -discussed that she can take vitamin C to increase iron  absorption with oral iron  if she chooses to, but vitamin C would not be needed with IV iron   -patient shall follow-up with OB/GYN to discuss option of IUD to control her menstrual bleeding  -discussed that a progesterone-releasing IUD would takes 1-2 cycles to control periods, and may be easier than significant surgery -discussed that an progesterone-releasing  IUD would be very easy to remove if it is not tolerated -educated patient that during hyperestrogenic cycles and towards the end of perimenopausal state, there is no ovulation and that phase is not driven by progesterone.  -educated patient that once someone reaches menopause, the fibroids tend to shrink -patient has no obvious blood in stools or black stools. We discussed  that there is a clear explanation of bleeding from her heavy menstrual losses. There are nothing obvious in the colon, but she is okay to proceed with colonoscopy from a screening standpoint.  -patient shall return to clinic in 6 months -answered all of patient's questions in detail  Follow up: IV feraheme weekly x 2 doses at [facility] Phone visit with Dr Onesimo in 6 months Labs 1-2 days prior to phone visit  The total time spent in the appointment was 20 minutes* .  All of the patient's questions were answered with apparent satisfaction. The patient knows to call the clinic with any problems, questions or concerns.   Emaline Onesimo MD MS AAHIVMS [facility] Hematology/Oncology Physician [facility]  .*Total Encounter Time as defined by the Centers for Medicare and Medicaid Services includes, in addition to the face-to-face time of a patient visit (documented in the note above) non-face-to-face time: obtaining and reviewing outside history, ordering and reviewing medications, tests or procedures, care coordination (communications with other health care professionals or caregivers) and documentation in  the medical record.    I,Mitra Faeizi,acting as a Neurosurgeon for Emaline Saran, MD.,have documented all relevant documentation on the behalf of Emaline Saran, MD,as directed by  Emaline Saran, MD while in the presence of Emaline Saran, MD.  .I have reviewed the above documentation for accuracy and completeness, and I agree with the above. .Allyne Hebert Kishore Breauna Mazzeo MD

## 2023-10-13 ENCOUNTER — Inpatient Hospital Stay (HOSPITAL_BASED_OUTPATIENT_CLINIC_OR_DEPARTMENT_OTHER): Admitting: Hematology

## 2023-10-13 ENCOUNTER — Telehealth: Payer: Self-pay

## 2023-10-13 DIAGNOSIS — D508 Other iron deficiency anemias: Secondary | ICD-10-CM

## 2023-10-13 DIAGNOSIS — D509 Iron deficiency anemia, unspecified: Secondary | ICD-10-CM | POA: Diagnosis not present

## 2023-10-13 DIAGNOSIS — N920 Excessive and frequent menstruation with regular cycle: Secondary | ICD-10-CM | POA: Diagnosis not present

## 2023-10-13 NOTE — Telephone Encounter (Signed)
 Auth Submission: NO AUTH NEEDED Site of care: Site of care: [facility] Payer: BCBS commercial Medication & CPT/J Code(s) submitted: Feraheme (ferumoxytol ) [id number] Diagnosis Code:  Route of submission (phone, fax, portal):  Phone # Fax # Auth type: Buy/Bill PB Units/visits requested: 510mg  x 2 doses Reference number:  Approval from: 10/13/23 to 02/13/24

## 2023-10-15 ENCOUNTER — Encounter: Payer: Self-pay | Admitting: Hematology

## 2023-10-16 ENCOUNTER — Telehealth: Payer: Self-pay | Admitting: Hematology

## 2023-10-16 NOTE — Telephone Encounter (Signed)
 Left patient a vm regarding upcoming appointment

## 2023-10-18 ENCOUNTER — Ambulatory Visit

## 2023-10-18 VITALS — BP 143/85 | HR 77 | Temp 98.4°F | Resp 20 | Ht 66.0 in | Wt 264.2 lb

## 2023-10-18 DIAGNOSIS — D508 Other iron deficiency anemias: Secondary | ICD-10-CM

## 2023-10-18 DIAGNOSIS — D509 Iron deficiency anemia, unspecified: Secondary | ICD-10-CM

## 2023-10-18 MED ORDER — ACETAMINOPHEN 325 MG PO TABS: 650.0000 mg | ORAL_TABLET | Freq: Once | ORAL | Status: DC

## 2023-10-18 MED ORDER — SODIUM CHLORIDE 0.9 % IV SOLN
510.0000 mg | Freq: Once | INTRAVENOUS | Status: AC
  Administered 2023-10-18: 510 mg via INTRAVENOUS
  Filled 2023-10-18: qty 17

## 2023-10-18 MED ORDER — LORATADINE 10 MG PO TABS
10.0000 mg | ORAL_TABLET | Freq: Every day | ORAL | Status: DC
  Administered 2023-10-18: 10 mg via ORAL
  Filled 2023-10-18: qty 1

## 2023-10-18 NOTE — Progress Notes (Signed)
 Diagnosis: Iron  Deficiency Anemia  Provider:  Praveen Mannam MD  Procedure: IV Infusion  IV Type: Peripheral, IV Location: L Antecubital  Feraheme (Ferumoxytol ), Dose: 510 mg  Infusion Start Time: 1548  Infusion Stop Time: 1606  Post Infusion IV Care: Patient declined observation and Peripheral IV Discontinued  Discharge: Condition: Good, Destination: Home . AVS Declined  Performed by:  Maximiano JONELLE Pouch, LPN

## 2023-10-19 DIAGNOSIS — E041 Nontoxic single thyroid nodule: Secondary | ICD-10-CM | POA: Diagnosis not present

## 2023-10-20 DIAGNOSIS — I493 Ventricular premature depolarization: Secondary | ICD-10-CM | POA: Diagnosis not present

## 2023-10-20 DIAGNOSIS — I499 Cardiac arrhythmia, unspecified: Secondary | ICD-10-CM | POA: Diagnosis not present

## 2023-10-21 DIAGNOSIS — I499 Cardiac arrhythmia, unspecified: Secondary | ICD-10-CM | POA: Diagnosis not present

## 2023-10-25 ENCOUNTER — Ambulatory Visit (INDEPENDENT_AMBULATORY_CARE_PROVIDER_SITE_OTHER)

## 2023-10-25 VITALS — BP 135/79 | HR 77 | Temp 98.2°F | Resp 18 | Ht 66.0 in | Wt 262.8 lb

## 2023-10-25 DIAGNOSIS — D509 Iron deficiency anemia, unspecified: Secondary | ICD-10-CM

## 2023-10-25 DIAGNOSIS — D508 Other iron deficiency anemias: Secondary | ICD-10-CM

## 2023-10-25 MED ORDER — ACETAMINOPHEN 325 MG PO TABS: 650.0000 mg | ORAL_TABLET | Freq: Once | ORAL | Status: DC

## 2023-10-25 MED ORDER — LORATADINE 10 MG PO TABS
10.0000 mg | ORAL_TABLET | Freq: Every day | ORAL | Status: DC
  Administered 2023-10-25: 10 mg via ORAL
  Filled 2023-10-25: qty 1

## 2023-10-25 MED ORDER — FERUMOXYTOL INJECTION 510 MG/17 ML
510.0000 mg | Freq: Once | INTRAVENOUS | Status: AC
  Administered 2023-10-25: 510 mg via INTRAVENOUS
  Filled 2023-10-25: qty 17

## 2023-10-25 NOTE — Progress Notes (Signed)
 Diagnosis: Iron  Deficiency Anemia  Provider:  Praveen Mannam MD  Procedure: IV Infusion  IV Type: Peripheral, IV Location: L Forearm  Feraheme (Ferumoxytol ), Dose: 510 mg  Infusion Start Time: 1559  Infusion Stop Time: 1619  Post Infusion IV Care: Patient declined observation and Peripheral IV Discontinued  Discharge: Condition: Good, Destination: Home . AVS Provided  Performed by:  Advit Trethewey, RN

## 2023-11-02 DIAGNOSIS — N923 Ovulation bleeding: Secondary | ICD-10-CM | POA: Diagnosis not present

## 2023-11-02 DIAGNOSIS — N939 Abnormal uterine and vaginal bleeding, unspecified: Secondary | ICD-10-CM | POA: Diagnosis not present

## 2023-11-02 DIAGNOSIS — N898 Other specified noninflammatory disorders of vagina: Secondary | ICD-10-CM | POA: Diagnosis not present

## 2023-11-07 ENCOUNTER — Ambulatory Visit (HOSPITAL_COMMUNITY)

## 2023-11-09 ENCOUNTER — Encounter (INDEPENDENT_AMBULATORY_CARE_PROVIDER_SITE_OTHER): Payer: Self-pay | Admitting: Family Medicine

## 2023-11-09 ENCOUNTER — Ambulatory Visit (INDEPENDENT_AMBULATORY_CARE_PROVIDER_SITE_OTHER): Admitting: Family Medicine

## 2023-11-09 VITALS — BP 158/87 | HR 71 | Temp 97.9°F | Ht 66.0 in | Wt 261.0 lb

## 2023-11-09 DIAGNOSIS — E669 Obesity, unspecified: Secondary | ICD-10-CM

## 2023-11-09 DIAGNOSIS — Z6841 Body Mass Index (BMI) 40.0 and over, adult: Secondary | ICD-10-CM

## 2023-11-09 DIAGNOSIS — F32A Depression, unspecified: Secondary | ICD-10-CM | POA: Diagnosis not present

## 2023-11-09 DIAGNOSIS — F419 Anxiety disorder, unspecified: Secondary | ICD-10-CM

## 2023-11-09 DIAGNOSIS — I10 Essential (primary) hypertension: Secondary | ICD-10-CM

## 2023-11-09 MED ORDER — LOSARTAN POTASSIUM 50 MG PO TABS: 50.0000 mg | ORAL_TABLET | Freq: Every day | ORAL | 0 refills | Status: DC

## 2023-11-09 NOTE — Progress Notes (Signed)
 SUBJECTIVE:  Chief Complaint: Obesity  Interim History: Patient has been busy working all summer- she is working two jobs and her son has been dealing with sports injuries since April. She has taken some time off from consistent meal planning and prep but did try to increase her protein intake.  She has quite a bit of medical things going on.  Jacqueline Griffin is here to discuss her progress with her obesity treatment plan. She is on the Category 4 Plan and states she is following her eating plan approximately 25 % of the time. She states she is walking 10,000+ steps per day.   OBJECTIVE: Visit Diagnoses: Problem List Items Addressed This Visit       Cardiovascular and Mediastinum   Essential hypertension - Primary   Relevant Medications   losartan  (COZAAR ) 50 MG tablet     Other   Morbid obesity (HCC)   BMI 40.0-44.9, adult (HCC) Current BMI 40.8   Other Visit Diagnoses       Anxiety and depression           Vitals Temp: 97.9 F (36.6 C) BP: (!) 158/87 Pulse Rate: 71 SpO2: 97 %   Anthropometric Measurements Height: 5' 6 (1.676 m) Weight: 261 lb (118.4 kg) BMI (Calculated): 42.15 Weight at Last Visit: 261 lb Weight Lost Since Last Visit: 0 Weight Gained Since Last Visit: 0 Starting Weight: 263 lb Total Weight Loss (lbs): 2 lb (0.907 kg)   Body Composition  Body Fat %: 50.4 % Fat Mass (lbs): 131.8 lbs Muscle Mass (lbs): 123 lbs Total Body Water (lbs): 95.2 lbs Visceral Fat Rating : 15   Other Clinical Data Today's Visit #: 43 Starting Date: 10/06/20 Comments: Cat 4     ASSESSMENT AND PLAN: Assessment & Plan Essential hypertension Blood pressure elevated on initial blood pressure check as well as recheck.  Patient denies chest pain, chest pressure, or headache.  However as patient's blood pressure has been elevated at prior appointments as well we will increase her blood pressure medication at this time.  Losartan  50 mg daily sent to pharmacy.  We check  patient's blood work in 1 month to reevaluate electrolytes on higher dose ARB therapy.  Follow-up blood pressure at next appointment Anxiety and depression Patient reports increased anxiety and depression around health, finances, and job stress.  She has prescription Zoloft  at home but has not started taking it.  She sees a Veterinary surgeon regularly.  Follow-up at next appointment to see whether or not patient has started Zoloft . Obesity with starting BMI of 42.4  BMI 40.0-44.9, adult (HCC)    Diet: Jacqueline Griffin is currently in the action stage of change. As such, her goal is to continue with weight loss efforts and has agreed to the Category 4 Plan.   Exercise:  For substantial health benefits, adults should do at least 150 minutes (2 hours and 30 minutes) a week of moderate-intensity, or 75 minutes (1 hour and 15 minutes) a week of vigorous-intensity aerobic physical activity, or an equivalent combination of moderate- and vigorous-intensity aerobic activity. Aerobic activity should be performed in episodes of at least 10 minutes, and preferably, it should be spread throughout the week.  Behavior Modification:  We discussed the following Behavioral Modification Strategies today: increasing lean protein intake, decreasing simple carbohydrates, increasing vegetables, meal planning and cooking strategies, keeping healthy foods in the home, and planning for success.   Return in about 5 weeks (around 12/14/2023).   She was informed of the importance of frequent  follow up visits to maximize her success with intensive lifestyle modifications for her multiple health conditions.  Attestation Statements:   Reviewed by clinician on day of visit: allergies, medications, problem list, medical history, surgical history, family history, social history, and previous encounter notes.     Jacqueline Cho, MD

## 2023-11-15 DIAGNOSIS — G4733 Obstructive sleep apnea (adult) (pediatric): Secondary | ICD-10-CM | POA: Diagnosis not present

## 2023-11-19 NOTE — Assessment & Plan Note (Signed)
 Blood pressure elevated on initial blood pressure check as well as recheck.  Patient denies chest pain, chest pressure, or headache.  However as patient's blood pressure has been elevated at prior appointments as well we will increase her blood pressure medication at this time.  Losartan  50 mg daily sent to pharmacy.  We check patient's blood work in 1 month to reevaluate electrolytes on higher dose ARB therapy.  Follow-up blood pressure at next appointment

## 2023-12-09 DIAGNOSIS — R051 Acute cough: Secondary | ICD-10-CM | POA: Diagnosis not present

## 2023-12-09 DIAGNOSIS — J019 Acute sinusitis, unspecified: Secondary | ICD-10-CM | POA: Diagnosis not present

## 2023-12-09 DIAGNOSIS — H65192 Other acute nonsuppurative otitis media, left ear: Secondary | ICD-10-CM | POA: Diagnosis not present

## 2023-12-09 DIAGNOSIS — R6889 Other general symptoms and signs: Secondary | ICD-10-CM | POA: Diagnosis not present

## 2023-12-09 DIAGNOSIS — R509 Fever, unspecified: Secondary | ICD-10-CM | POA: Diagnosis not present

## 2023-12-12 ENCOUNTER — Encounter (INDEPENDENT_AMBULATORY_CARE_PROVIDER_SITE_OTHER): Payer: Self-pay | Admitting: Family Medicine

## 2023-12-12 ENCOUNTER — Ambulatory Visit (INDEPENDENT_AMBULATORY_CARE_PROVIDER_SITE_OTHER): Admitting: Family Medicine

## 2023-12-12 VITALS — BP 149/80 | HR 79 | Temp 97.6°F | Ht 66.0 in | Wt 255.0 lb

## 2023-12-12 DIAGNOSIS — E669 Obesity, unspecified: Secondary | ICD-10-CM | POA: Diagnosis not present

## 2023-12-12 DIAGNOSIS — E041 Nontoxic single thyroid nodule: Secondary | ICD-10-CM

## 2023-12-12 DIAGNOSIS — Z6841 Body Mass Index (BMI) 40.0 and over, adult: Secondary | ICD-10-CM

## 2023-12-12 DIAGNOSIS — I10 Essential (primary) hypertension: Secondary | ICD-10-CM

## 2023-12-12 MED ORDER — LOSARTAN POTASSIUM 50 MG PO TABS: 50.0000 mg | ORAL_TABLET | Freq: Every day | ORAL | 0 refills | Status: DC

## 2023-12-12 NOTE — Assessment & Plan Note (Addendum)
 Patient saw general surgery and was told there is a likely possibility she will need to get part of her thyroid  removed.  She is apprehensive about the removal.  She will follow up with general surgery in December for remeasurement of nodule at that time.

## 2023-12-12 NOTE — Progress Notes (Signed)
 SUBJECTIVE:  Chief Complaint: Obesity  Interim History: patient has been working and trying to stay focused on her meal plan.  She is trying to not let her mind ruminate.  She forgot to take higher dose of blood pressure medication so that is why her BP is elevated today.  She has been mindful of fried food and limiting her simple carbs.  She is focusing on increasing her protein.  Jacqueline Griffin is here to discuss her progress with her obesity treatment plan. She is on the Category 4 Plan and states she is following her eating plan approximately 50 % of the time. She states she is not exercising but is walking more with her two jobs.   OBJECTIVE: Visit Diagnoses: Problem List Items Addressed This Visit       Cardiovascular and Mediastinum   Essential hypertension - Primary   Relevant Medications   losartan  (COZAAR ) 50 MG tablet     Endocrine   Thyroid  nodule   Patient saw general surgery and was told there is a likely possibility she will need to get part of her thyroid  removed.  She is apprehensive about the removal.  She will follow up with general surgery in December for remeasurement of nodule at that time.         Other   Morbid obesity (HCC)   BMI 40.0-44.9, adult (HCC) Current BMI 40.8    Vitals Temp: 97.6 F (36.4 C) BP: (!) 149/80 Pulse Rate: 79 SpO2: 97 %   Anthropometric Measurements Height: 5' 6 (1.676 m) Weight: 255 lb (115.7 kg) BMI (Calculated): 41.18 Weight at Last Visit: 261 lb Weight Lost Since Last Visit: 5 Weight Gained Since Last Visit: 0 Starting Weight: 263 lb Total Weight Loss (lbs): 8 lb (3.629 kg)   Body Composition  Body Fat %: 48.2 % Fat Mass (lbs): 123.2 lbs Muscle Mass (lbs): 125.6 lbs Total Body Water (lbs): 88.6 lbs Visceral Fat Rating : 15   Other Clinical Data Today's Visit #: 46 Starting Date: 10/06/20 Comments: Cat 4     ASSESSMENT AND PLAN: Assessment & Plan Essential hypertension Blood pressure elevated today but  no chest pain, chest pressure or headache.  She mentions she forgot to increase her medication up to 50mg  daily.  Patient to get higher dose prescription today and will follow up on blood pressure management at next appointment. Thyroid  nodule Patient saw general surgery and was told there is a likely possibility she will need to get part of her thyroid  removed.  She is apprehensive about the removal.  She will follow up with general surgery in December for remeasurement of nodule at that time.  Obesity with starting BMI of 42.4  BMI 40.0-44.9, adult (HCC)    Diet: Jacqueline Griffin is currently in the action stage of change. As such, her goal is to continue with weight loss efforts and has agreed to the Category 4 Plan.   Exercise:  For substantial health benefits, adults should do at least 150 minutes (2 hours and 30 minutes) a week of moderate-intensity, or 75 minutes (1 hour and 15 minutes) a week of vigorous-intensity aerobic physical activity, or an equivalent combination of moderate- and vigorous-intensity aerobic activity. Aerobic activity should be performed in episodes of at least 10 minutes, and preferably, it should be spread throughout the week.  Behavior Modification:  We discussed the following Behavioral Modification Strategies today: increasing lean protein intake, decreasing simple carbohydrates, increasing vegetables, meal planning and cooking strategies, and keeping healthy foods in the  home.   Return in about 6 weeks (around 01/23/2024).   She was informed of the importance of frequent follow up visits to maximize her success with intensive lifestyle modifications for her multiple health conditions.  Attestation Statements:   Reviewed by clinician on day of visit: allergies, medications, problem list, medical history, surgical history, family history, social history, and previous encounter notes.   Adelita Cho, MD

## 2023-12-12 NOTE — Assessment & Plan Note (Signed)
 Blood pressure elevated today but no chest pain, chest pressure or headache.  She mentions she forgot to increase her medication up to 50mg  daily.  Patient to get higher dose prescription today and will follow up on blood pressure management at next appointment.

## 2023-12-21 DIAGNOSIS — N939 Abnormal uterine and vaginal bleeding, unspecified: Secondary | ICD-10-CM | POA: Diagnosis not present

## 2023-12-21 DIAGNOSIS — Z01812 Encounter for preprocedural laboratory examination: Secondary | ICD-10-CM | POA: Diagnosis not present

## 2024-01-02 ENCOUNTER — Other Ambulatory Visit (INDEPENDENT_AMBULATORY_CARE_PROVIDER_SITE_OTHER): Payer: Self-pay | Admitting: Physician Assistant

## 2024-01-02 DIAGNOSIS — E559 Vitamin D deficiency, unspecified: Secondary | ICD-10-CM

## 2024-01-15 DIAGNOSIS — G4733 Obstructive sleep apnea (adult) (pediatric): Secondary | ICD-10-CM | POA: Diagnosis not present

## 2024-01-17 ENCOUNTER — Ambulatory Visit (INDEPENDENT_AMBULATORY_CARE_PROVIDER_SITE_OTHER): Admitting: Family Medicine

## 2024-01-17 VITALS — BP 164/82 | HR 68 | Temp 97.6°F | Ht 66.0 in | Wt 254.0 lb

## 2024-01-17 DIAGNOSIS — F419 Anxiety disorder, unspecified: Secondary | ICD-10-CM | POA: Diagnosis not present

## 2024-01-17 DIAGNOSIS — Z6841 Body Mass Index (BMI) 40.0 and over, adult: Secondary | ICD-10-CM

## 2024-01-17 DIAGNOSIS — E669 Obesity, unspecified: Secondary | ICD-10-CM | POA: Diagnosis not present

## 2024-01-17 DIAGNOSIS — I10 Essential (primary) hypertension: Secondary | ICD-10-CM | POA: Diagnosis not present

## 2024-01-17 DIAGNOSIS — F32A Depression, unspecified: Secondary | ICD-10-CM | POA: Diagnosis not present

## 2024-01-17 MED ORDER — SERTRALINE HCL 50 MG PO TABS: 50.0000 mg | ORAL_TABLET | Freq: Every day | ORAL | 0 refills | Status: AC

## 2024-01-17 MED ORDER — LOSARTAN POTASSIUM 50 MG PO TABS
50.0000 mg | ORAL_TABLET | Freq: Every day | ORAL | 0 refills | Status: AC
Start: 2024-01-17 — End: ?

## 2024-01-17 NOTE — Progress Notes (Signed)
   SUBJECTIVE:  Chief Complaint: Obesity  Interim History: Patient has been trying to stay consistent with her intake over the last few weeks. She just stopped her second job.  She is still trying to follow her plan as much as she can.  She went to the OB/Gyn and 2 fibroids were found.  She is contemplating getting a hysterectomy.    Jacqueline Griffin is here to discuss her progress with her obesity treatment plan. She is on the Category 4 Plan and states she is following her eating plan approximately 65 % of the time. She states she is not exercising.   OBJECTIVE: Visit Diagnoses: Problem List Items Addressed This Visit       Cardiovascular and Mediastinum   Essential hypertension   Relevant Medications   losartan  (COZAAR ) 50 MG tablet     Other   Morbid obesity (HCC)   BMI 40.0-44.9, adult (HCC) Current BMI 40.8   Other Visit Diagnoses       Anxiety and depression    -  Primary   Relevant Medications   sertraline  (ZOLOFT ) 50 MG tablet       Vitals Temp: 97.6 F (36.4 C) BP: (!) 164/82 Pulse Rate: 68 SpO2: 98 %   Anthropometric Measurements Height: 5' 6 (1.676 m) Weight: 254 lb (115.2 kg) BMI (Calculated): 41.02 Weight at Last Visit: 255 lb Weight Lost Since Last Visit: 1 Weight Gained Since Last Visit: 0 Starting Weight: 263 lb Total Weight Loss (lbs): 9 lb (4.082 kg)   Body Composition  Body Fat %: 48.6 % Fat Mass (lbs): 123.8 lbs Muscle Mass (lbs): 124.2 lbs Total Body Water (lbs): 88.8 lbs Visceral Fat Rating : 14   Other Clinical Data Today's Visit #: 40 Starting Date: 10/06/20 Comments: Cat 4     ASSESSMENT AND PLAN: Assessment & Plan Anxiety and depression At this time patient feels ready to start sertraline .  She is feeling overwhelmed and short tempered.  Refill of sertraline  50mg  sent in.  No suicidal or homicidal ideationg. Obesity with starting BMI of 42.4  BMI 40.0-44.9, adult (HCC)  Essential hypertension BP elevated today.  No chest  pain, chest pressure or headache.  Needs a refill of losartan  today.  Continue current dose without changes.  If BP remains elevated at next appointment will increase medication   Diet: Pammie is currently in the action stage of change. As such, her goal is to continue with weight loss efforts and has agreed to the Category 4 Plan.   Exercise:  All adults should avoid inactivity. Some activity is better than none, and adults who participate in any amount of physical activity, gain some health benefits.  Behavior Modification:  We discussed the following Behavioral Modification Strategies today: increasing lean protein intake, decreasing simple carbohydrates, increasing vegetables, meal planning and cooking strategies, and planning for success.   Return in about 4 weeks (around 02/14/2024).   She was informed of the importance of frequent follow up visits to maximize her success with intensive lifestyle modifications for her multiple health conditions.  Attestation Statements:   Reviewed by clinician on day of visit: allergies, medications, problem list, medical history, surgical history, family history, social history, and previous encounter notes.   Adelita Cho, MD

## 2024-01-23 DIAGNOSIS — Z1231 Encounter for screening mammogram for malignant neoplasm of breast: Secondary | ICD-10-CM | POA: Diagnosis not present

## 2024-01-23 DIAGNOSIS — R92333 Mammographic heterogeneous density, bilateral breasts: Secondary | ICD-10-CM | POA: Diagnosis not present

## 2024-01-25 NOTE — Assessment & Plan Note (Signed)
 BP elevated today.  No chest pain, chest pressure or headache.  Needs a refill of losartan  today.  Continue current dose without changes.  If BP remains elevated at next appointment will increase medication

## 2024-01-26 DIAGNOSIS — G4733 Obstructive sleep apnea (adult) (pediatric): Secondary | ICD-10-CM | POA: Diagnosis not present

## 2024-02-14 ENCOUNTER — Ambulatory Visit (INDEPENDENT_AMBULATORY_CARE_PROVIDER_SITE_OTHER): Payer: Self-pay | Admitting: Family Medicine

## 2024-02-15 DIAGNOSIS — G4733 Obstructive sleep apnea (adult) (pediatric): Secondary | ICD-10-CM | POA: Diagnosis not present

## 2024-02-20 ENCOUNTER — Ambulatory Visit (INDEPENDENT_AMBULATORY_CARE_PROVIDER_SITE_OTHER): Admitting: Family Medicine

## 2024-02-26 ENCOUNTER — Ambulatory Visit (INDEPENDENT_AMBULATORY_CARE_PROVIDER_SITE_OTHER): Admitting: Family Medicine

## 2024-03-06 DIAGNOSIS — I10 Essential (primary) hypertension: Secondary | ICD-10-CM | POA: Diagnosis not present

## 2024-03-06 DIAGNOSIS — E041 Nontoxic single thyroid nodule: Secondary | ICD-10-CM | POA: Diagnosis not present

## 2024-03-29 ENCOUNTER — Ambulatory Visit: Admitting: Family Medicine

## 2024-04-08 ENCOUNTER — Ambulatory Visit (INDEPENDENT_AMBULATORY_CARE_PROVIDER_SITE_OTHER): Admitting: Family Medicine

## 2024-04-08 VITALS — BP 135/63 | HR 75 | Temp 97.9°F | Ht 66.0 in | Wt 255.0 lb

## 2024-04-08 DIAGNOSIS — E669 Obesity, unspecified: Secondary | ICD-10-CM

## 2024-04-08 DIAGNOSIS — I10 Essential (primary) hypertension: Secondary | ICD-10-CM | POA: Diagnosis not present

## 2024-04-08 DIAGNOSIS — Z6841 Body Mass Index (BMI) 40.0 and over, adult: Secondary | ICD-10-CM | POA: Diagnosis not present

## 2024-04-08 DIAGNOSIS — G4733 Obstructive sleep apnea (adult) (pediatric): Secondary | ICD-10-CM

## 2024-04-08 NOTE — Progress Notes (Signed)
" ° °  SUBJECTIVE:  Chief Complaint: Obesity  Interim History: patient voices she had a lot of deaths over the holidays.  Patient had appointment with sleep apnea provider and mentions she felt dizzy on her losartan  50mg .  She ready to more consistently commit to the meal plan.  She voices the Category 4 may be the easiest to stay consistent with.  She has Achilles tendinitis she is dealing with currently so she is in a walking boot.  Jacqueline Griffin is here to discuss her progress with her obesity treatment plan. She is on the Category 4 Plan and states she is following her eating plan approximately 60 % of the time. She states she is not exercising.   OBJECTIVE: Visit Diagnoses: Problem List Items Addressed This Visit       Cardiovascular and Mediastinum   Essential hypertension - Primary     Respiratory   OSA on CPAP     Other   Morbid obesity (HCC)   BMI 40.0-44.9, adult (HCC) Current BMI 40.8    Vitals Temp: 97.9 F (36.6 C) BP: 135/63 Pulse Rate: 75 SpO2: 96 %   Anthropometric Measurements Height: 5' 6 (1.676 m) Weight: 255 lb (115.7 kg) BMI (Calculated): 41.18 Weight at Last Visit: 254 lb Weight Lost Since Last Visit: 0 Weight Gained Since Last Visit: 1 Starting Weight: 263 lb Total Weight Loss (lbs): 8 lb (3.629 kg)   Body Composition  Body Fat %: 48.6 % Fat Mass (lbs): 124.2 lbs Muscle Mass (lbs): 124.6 lbs Total Body Water (lbs): 88.8 lbs Visceral Fat Rating : 14   Other Clinical Data Today's Visit #: 41 Starting Date: 10/06/20 Comments: Cat 4     ASSESSMENT AND PLAN: Assessment & Plan Essential hypertension Blood pressure controlled today.  No chest pain, chest pressure, or headache.  Patient has been consistently taking her medication and is very motivated to stay committed to keeping blood pressure controlled. OSA on CPAP Patient is consistent with her usage of her CPAP.  Continue current treatment plan with no change. BMI 40.0-44.9, adult  (HCC)  Obesity with starting BMI of 42.4    Diet: Ivett is currently in the action stage of change. As such, her goal is to continue with weight loss efforts and has agreed to the Category 4 Plan.   Exercise:  All adults should avoid inactivity. Some activity is better than none, and adults who participate in any amount of physical activity, gain some health benefits.  Behavior Modification:  We discussed the following Behavioral Modification Strategies today: increasing lean protein intake, decreasing simple carbohydrates, increasing vegetables, meal planning and cooking strategies, keeping healthy foods in the home, and planning for success.   Follow-up in 4 weeks.  She was informed of the importance of frequent follow up visits to maximize her success with intensive lifestyle modifications for her multiple health conditions.  Attestation Statements:   Reviewed by clinician on day of visit: allergies, medications, problem list, medical history, surgical history, family history, social history, and previous encounter notes.    Adelita Cho, MD "

## 2024-04-16 ENCOUNTER — Other Ambulatory Visit: Payer: Self-pay

## 2024-04-16 DIAGNOSIS — D508 Other iron deficiency anemias: Secondary | ICD-10-CM

## 2024-04-17 ENCOUNTER — Inpatient Hospital Stay: Attending: Hematology

## 2024-04-17 NOTE — Assessment & Plan Note (Signed)
 Blood pressure controlled today.  No chest pain, chest pressure, or headache.  Patient has been consistently taking her medication and is very motivated to stay committed to keeping blood pressure controlled.

## 2024-04-17 NOTE — Assessment & Plan Note (Signed)
 Patient is consistent with her usage of her CPAP.  Continue current treatment plan with no change.

## 2024-04-18 ENCOUNTER — Telehealth: Payer: Self-pay | Admitting: Hematology

## 2024-04-18 NOTE — Telephone Encounter (Signed)
 I received a voicemail from this patient needing to reschedule her labs and phone visit. I called the patient and spoke with her on getting the appointments moved. Her labs and phone visit are now scheduled for the end of February. Patient is aware.

## 2024-04-19 ENCOUNTER — Inpatient Hospital Stay: Admitting: Hematology

## 2024-05-06 ENCOUNTER — Ambulatory Visit (INDEPENDENT_AMBULATORY_CARE_PROVIDER_SITE_OTHER): Admitting: Family Medicine

## 2024-05-20 ENCOUNTER — Inpatient Hospital Stay

## 2024-05-22 ENCOUNTER — Inpatient Hospital Stay: Admitting: Hematology
